# Patient Record
Sex: Female | Born: 1950
Health system: Southern US, Community
[De-identification: ages and names within clinical notes are randomized; demographics above are authoritative.]

## PROBLEM LIST (undated history)

## (undated) DIAGNOSIS — M1990 Unspecified osteoarthritis, unspecified site: Secondary | ICD-10-CM

## (undated) DIAGNOSIS — I10 Essential (primary) hypertension: Secondary | ICD-10-CM

## (undated) HISTORY — PX: JOINT REPLACEMENT: SHX530

## (undated) HISTORY — PX: TONSILLECTOMY: SUR1361

---

## 1997-08-26 HISTORY — PX: REFRACTIVE SURGERY: SHX103

## 2018-09-02 DIAGNOSIS — Z6841 Body Mass Index (BMI) 40.0 and over, adult: Secondary | ICD-10-CM | POA: Insufficient documentation

## 2018-09-22 ENCOUNTER — Encounter: Payer: Self-pay | Admitting: Podiatry

## 2018-09-22 ENCOUNTER — Ambulatory Visit: Payer: Medicare Other | Admitting: Podiatry

## 2018-09-22 VITALS — BP 183/81 | HR 64

## 2018-09-22 DIAGNOSIS — M79676 Pain in unspecified toe(s): Secondary | ICD-10-CM

## 2018-09-22 DIAGNOSIS — B351 Tinea unguium: Secondary | ICD-10-CM

## 2018-09-23 ENCOUNTER — Inpatient Hospital Stay: Admission: RE | Admit: 2018-09-23 | Payer: Self-pay | Source: Ambulatory Visit

## 2018-09-23 NOTE — H&P (Signed)
Olivia Rodriguez is a 68 y.o. female here for PMB .pt G1 P1 c/s and is a new pt for me . She has a 2 month history  of PMB  No pelvic pain . She has not had gyn care in 40 yrs . No pap . No Fhx of gyn cancer .   No Diabetes , but did have GDM in pregnancy   Past Medical History:  has a past medical history of Hypertension.  Past Surgical History:  has a past surgical history that includes Tonsillectomy and Cesarean section. Family History: family history includes Heart disease in her father and mother; High blood pressure (Hypertension) in her father and mother; Myocardial Infarction (Heart attack) in her father and mother. Social History:  reports that she has never smoked. She has never used smokeless tobacco. She reports current alcohol use. She reports that she does not use drugs. OB/GYN History:          OB History    Gravida  1   Para  1   Term      Preterm      AB      Living  1     SAB      TAB      Ectopic      Molar      Multiple      Live Births  1          Allergies: is allergic to penicillin. Medications:  Current Outpatient Medications:  .  fexofenadine (ALLEGRA) 60 MG tablet, Take 60 mg by mouth once daily as needed, Disp: , Rfl:  .  ibuprofen (ADVIL,MOTRIN) 200 MG tablet, Take 200 mg by mouth every 6 (six) hours as needed for Pain., Disp: , Rfl:  .  mirabegron (MYRBETRIQ) 25 mg ER Tablet, Take 1 tablet (25 mg total) by mouth once daily, Disp: 30 tablet, Rfl: 11  Review of Systems: General:                      No fatigue or weight loss Eyes:                           No vision changes Ears:                            No hearing difficulty Respiratory:                No cough or shortness of breath Pulmonary:                  No asthma or shortness of breath Cardiovascular:           No chest pain, palpitations, dyspnea on exertion Gastrointestinal:          No abdominal bloating, chronic diarrhea, constipations, masses, pain or  hematochezia Genitourinary:             No hematuria, dysuria, abnormal vaginal discharge, pelvic pain, Menometrorrhagia Lymphatic:                   No swollen lymph nodes Musculoskeletal:         No muscle weakness Neurologic:                  No extremity weakness, syncope, seizure disorder Psychiatric:  No history of depression, delusions or suicidal/homicidal ideation    Exam:      Vitals:   09/22/18 1001  BP: 143/81  Pulse: 69    Body mass index is 45.54 kg/m.  WDWN white/ black female in NAD   Lungs: CTA  CV : RRR without murmur    Neck:  no thyromegaly Abdomen: soft , no mass, normal active bowel sounds,  non-tender, no rebound tenderness Pelvic: tanner stage 5 ,  External genitalia: vulva /labia no lesions Urethra: no prolapse Vagina: normal physiologic d/c Cervix: no lesions, no cervical motion tenderness , inability to fully visualize  Uterus: normal size shape and contour, non-tender Adnexa: no mass,  non-tender   Rectovaginal:  U/s :Lt lat fibroid near cx=23 mm  Endometrium thickened=35.32 mm  Neither ov seen  No adnexal masses seen     Impression:   The primary encounter diagnosis was PMB (postmenopausal bleeding). A diagnosis of Screening for cervical cancer was also pertinent to this visit. Inability to sample endometrial lining due to patient's body habitus and limitation with left LE   Plan:   Same day Fractional D+C , Hysterography

## 2018-09-29 ENCOUNTER — Other Ambulatory Visit: Payer: Self-pay

## 2018-09-29 ENCOUNTER — Encounter
Admission: RE | Admit: 2018-09-29 | Discharge: 2018-09-29 | Disposition: A | Discharge status: Home / Self Care | Payer: Medicare Other | Source: Ambulatory Visit | Attending: Orthopedic Surgery | Admitting: Orthopedic Surgery

## 2018-09-29 DIAGNOSIS — Z01818 Encounter for other preprocedural examination: Secondary | ICD-10-CM | POA: Diagnosis not present

## 2018-09-29 HISTORY — DX: Unspecified osteoarthritis, unspecified site: M19.90

## 2018-09-29 LAB — URINALYSIS, ROUTINE W REFLEX MICROSCOPIC
Bilirubin Urine: NEGATIVE
Glucose, UA: NEGATIVE mg/dL
Ketones, ur: NEGATIVE mg/dL
Nitrite: NEGATIVE
Protein, ur: 100 mg/dL — AB
RBC / HPF: >50 RBC/hpf — ABNORMAL HIGH (ref 0–5)
Specific Gravity, Urine: 1.018 (ref 1.005–1.030)
WBC, UA: >50 WBC/hpf — ABNORMAL HIGH (ref 0–5)
pH: 6 (ref 5.0–8.0)

## 2018-09-29 LAB — SURGICAL PCR SCREEN
MRSA, PCR: NEGATIVE
Staphylococcus aureus: POSITIVE — AB

## 2018-09-29 LAB — TYPE AND SCREEN
ABO/RH(D): O POS
Antibody Screen: NEGATIVE

## 2018-09-29 LAB — BASIC METABOLIC PANEL
Anion gap: 8 (ref 5–15)
BUN: 31 mg/dL — ABNORMAL HIGH (ref 8–23)
CALCIUM: 9.3 mg/dL (ref 8.9–10.3)
CO2: 25 mmol/L (ref 22–32)
Chloride: 103 mmol/L (ref 98–111)
Creatinine, Ser: 0.67 mg/dL (ref 0.44–1.00)
GFR calc Af Amer: >60 mL/min (ref >60)
GFR calc non Af Amer: >60 mL/min (ref >60)
Glucose, Bld: 96 mg/dL (ref 70–99)
Potassium: 3.9 mmol/L (ref 3.5–5.1)
Sodium: 136 mmol/L (ref 135–145)

## 2018-09-29 LAB — APTT: aPTT: 33 seconds (ref 24–36)

## 2018-09-29 LAB — CBC
HCT: 41.7 % (ref 36.0–46.0)
Hemoglobin: 13.6 g/dL (ref 12.0–15.0)
MCH: 30.9 pg (ref 26.0–34.0)
MCHC: 32.6 g/dL (ref 30.0–36.0)
MCV: 94.8 fL (ref 80.0–100.0)
NRBC: 0 % (ref 0.0–0.2)
PLATELETS: 325 10*3/uL (ref 150–400)
RBC: 4.4 MIL/uL (ref 3.87–5.11)
RDW: 12.2 % (ref 11.5–15.5)
WBC: 8 10*3/uL (ref 4.0–10.5)

## 2018-09-29 LAB — PROTIME-INR
INR: 0.99
Prothrombin Time: 13 seconds (ref 11.4–15.2)

## 2018-09-29 LAB — SEDIMENTATION RATE: Sed Rate: 15 mm/hr (ref 0–30)

## 2018-09-29 NOTE — Progress Notes (Signed)
   SUBJECTIVE Patient presents to office today complaining of elongated, thickened nails that cause pain while ambulating in shoes. She is unable to trim her own nails. Patient is here for further evaluation and treatment.  History reviewed. No pertinent past medical history.  OBJECTIVE General Patient is awake, alert, and oriented x 3 and in no acute distress. Derm Skin is dry and supple bilateral. Negative open lesions or macerations. Remaining integument unremarkable. Nails are tender, long, thickened and dystrophic with subungual debris, consistent with onychomycosis, 1-5 bilateral. No signs of infection noted. Vasc  DP and PT pedal pulses palpable bilaterally. Temperature gradient within normal limits.  Neuro Epicritic and protective threshold sensation grossly intact bilaterally.  Musculoskeletal Exam No symptomatic pedal deformities noted bilateral. Muscular strength within normal limits.  ASSESSMENT 1. Onychodystrophic nails 1-5 bilateral with hyperkeratosis of nails.  2. Onychomycosis of nail due to dermatophyte bilateral 3. Pain in foot bilateral  PLAN OF CARE 1. Patient evaluated today.  2. Instructed to maintain good pedal hygiene and foot care.  3. Mechanical debridement of nails 1-5 bilaterally performed using a nail nipper. Filed with dremel without incident.  4. Return to clinic in 3 mos.    Yenifer Saccente M. Jahnay Lantier, DPM [facility]  Dr. Treylin Burtch M. Khadir Roam, DPM    [address]                                        , [place] [address]                Office [phone]  Fax [phone]      

## 2018-09-29 NOTE — Patient Instructions (Addendum)
Your procedure is scheduled on: Friday 10/02/18.   Report to DAY SURGERY DEPARTMENT LOCATED ON 2ND FLOOR MEDICAL MALL ENTRANCE. To find out your arrival time please call [phone] between 1PM - 3PM on Thursday 10/01/18.   Remember: Instructions that are not followed completely may result in serious medical risk, up to and including death, or upon the discretion of your surgeon and anesthesiologist your surgery may need to be rescheduled.      _X__ 1. Do not eat food after midnight the night before your procedure.                 No gum chewing or hard candies. You may drink clear liquids up to 2 hours                 before you are scheduled to arrive for your surgery- DO NOT drink clear                 liquids within 2 hours of the start of your surgery.                 Clear Liquids include:  water, apple juice without pulp, clear carbohydrate                 drink such as Clearfast or Gatorade, Black Coffee or Tea (Do not add                 milk or creamer to coffee or tea).   __X__2.  On the morning of surgery brush your teeth with toothpaste and water, you may rinse your mouth with mouthwash if you wish.  Do not swallow any toothpaste or mouthwash.      _X__ 3.  No Alcohol for 24 hours before or after surgery.   __X__4.  Notify your doctor if there is any change in your medical condition      (cold, fever, infections).      Do not wear jewelry, make-up, hairpins, clips or nail polish. Do not wear lotions, powders, or perfumes.  Do not shave 48 hours prior to surgery. Men may shave face and neck. Do not bring valuables to the hospital.     [facility] is not responsible for any belongings or valuables.   Contacts, dentures/partials or body piercings may not be worn into surgery. Bring a case for your contacts, glasses or hearing aids, a denture cup will be supplied.    Patients will not be allowed to drive themselves home after surgery. Please plan on having a  competent adult with you.      Please read over the following fact sheets that you were given:   MRSA Information    __X__ Take these medicines the morning of surgery with A SIP OF WATER:     1. NONE      __X__ Stop Anti-inflammatories 7 days before surgery such as Advil, Ibuprofen, Motrin, BC or Goodies Powder, Naprosyn, Naproxen, Aleve, Aspirin, Meloxicam. May take Tylenol if needed for pain or discomfort.    __X__ Do not begin taking any new herbal supplements or over the counter medications until after your procedure.

## 2018-09-30 LAB — URINE CULTURE

## 2018-09-30 NOTE — Pre-Procedure Instructions (Signed)
CLEARANCE REQUEST CALLED AND FAXED WITH EKG TO DR [facility] AND DR Lambert NOTIFIED CASEY AT DR [facility]

## 2018-10-01 NOTE — Pre-Procedure Instructions (Signed)
FAXED UC TO DR [facility]

## 2018-10-01 NOTE — Pre-Procedure Instructions (Signed)
CNL PER DR SCHERMERHORN. CASEY AT DR [facility] NOTIFIED

## 2018-10-02 ENCOUNTER — Encounter: Admission: RE | Payer: Self-pay | Source: Home / Self Care

## 2018-10-02 ENCOUNTER — Ambulatory Visit
Admission: RE | Admit: 2018-10-02 | Payer: Medicare Other | Source: Home / Self Care | Admitting: Obstetrics and Gynecology

## 2018-10-02 SURGERY — DILATATION AND CURETTAGE /HYSTEROSCOPY
Anesthesia: Choice

## 2018-10-02 NOTE — Pre-Procedure Instructions (Signed)
Received fax from Dr. Candiss Norse stating that she has never seen this patient before and patient cancelled her new patient appt for earlier this month. Dr. Rudene Christians office aware that there is a need for clearance d/t her irregular EKG. Patient does not seem to realize that that surgery will also be cancelled as the scheduled procedure for today with Dr. Ouida Sills. Per Sherlie Ban, she has left a message with the patient to call back to [facility] prior to any further cancellations.

## 2018-10-05 DIAGNOSIS — I10 Essential (primary) hypertension: Secondary | ICD-10-CM | POA: Insufficient documentation

## 2018-10-05 MED ORDER — CLINDAMYCIN PHOSPHATE 900 MG/50ML IV SOLN
900.0000 mg | Freq: Once | INTRAVENOUS | Status: AC
  Administered 2018-10-06: 900 mg via INTRAVENOUS

## 2018-10-05 MED ORDER — TRANEXAMIC ACID-NACL 1000-0.7 MG/100ML-% IV SOLN
1000.0000 mg | INTRAVENOUS | Status: AC
  Administered 2018-10-06: 1000 mg via INTRAVENOUS
  Filled 2018-10-05: qty 100

## 2018-10-05 NOTE — Pre-Procedure Instructions (Signed)
CLEARED BY DR PARASCHOS LOW RISK 10/05/18 ON CHART

## 2018-10-06 ENCOUNTER — Inpatient Hospital Stay: Payer: Medicare Other

## 2018-10-06 ENCOUNTER — Encounter
Admission: RE | Disposition: A | Discharge status: Skilled Nursing Facility | Payer: Self-pay | Source: Home / Self Care | Attending: Orthopedic Surgery

## 2018-10-06 ENCOUNTER — Inpatient Hospital Stay: Payer: Medicare Other | Admitting: Anesthesiology

## 2018-10-06 ENCOUNTER — Other Ambulatory Visit: Payer: Self-pay

## 2018-10-06 ENCOUNTER — Inpatient Hospital Stay
Admission: RE | Admit: 2018-10-06 | Discharge: 2018-10-09 | DRG: 470 | Disposition: A | Discharge status: Skilled Nursing Facility | Payer: Medicare Other | Attending: Orthopedic Surgery | Admitting: Orthopedic Surgery

## 2018-10-06 DIAGNOSIS — M1612 Unilateral primary osteoarthritis, left hip: Secondary | ICD-10-CM | POA: Diagnosis present

## 2018-10-06 DIAGNOSIS — Z419 Encounter for procedure for purposes other than remedying health state, unspecified: Secondary | ICD-10-CM

## 2018-10-06 DIAGNOSIS — Z880 Allergy status to penicillin: Secondary | ICD-10-CM | POA: Diagnosis not present

## 2018-10-06 DIAGNOSIS — G8918 Other acute postprocedural pain: Secondary | ICD-10-CM

## 2018-10-06 DIAGNOSIS — Z8249 Family history of ischemic heart disease and other diseases of the circulatory system: Secondary | ICD-10-CM | POA: Diagnosis not present

## 2018-10-06 DIAGNOSIS — Z96642 Presence of left artificial hip joint: Secondary | ICD-10-CM

## 2018-10-06 DIAGNOSIS — Z6841 Body Mass Index (BMI) 40.0 and over, adult: Secondary | ICD-10-CM

## 2018-10-06 HISTORY — PX: TOTAL HIP ARTHROPLASTY: SHX124

## 2018-10-06 LAB — ABO/RH: ABO/RH(D): O POS

## 2018-10-06 LAB — CREATININE, SERUM
CREATININE: 0.68 mg/dL (ref 0.44–1.00)
GFR calc Af Amer: >60 mL/min (ref >60)
GFR calc non Af Amer: >60 mL/min (ref >60)

## 2018-10-06 LAB — CBC
HCT: 39.8 % (ref 36.0–46.0)
Hemoglobin: 13.1 g/dL (ref 12.0–15.0)
MCH: 31.6 pg (ref 26.0–34.0)
MCHC: 32.9 g/dL (ref 30.0–36.0)
MCV: 95.9 fL (ref 80.0–100.0)
Platelets: 242 10*3/uL (ref 150–400)
RBC: 4.15 MIL/uL (ref 3.87–5.11)
RDW: 12.1 % (ref 11.5–15.5)
WBC: 8.3 10*3/uL (ref 4.0–10.5)
nRBC: 0 % (ref 0.0–0.2)

## 2018-10-06 SURGERY — ARTHROPLASTY, HIP, TOTAL, ANTERIOR APPROACH
Anesthesia: Spinal | Site: Hip | Laterality: Left

## 2018-10-06 MED ORDER — DEXMEDETOMIDINE HCL 200 MCG/2ML IV SOLN
INTRAVENOUS | Status: DC | PRN
  Administered 2018-10-06 (×5): 4 ug via INTRAVENOUS

## 2018-10-06 MED ORDER — TRAMADOL HCL 50 MG PO TABS
50.0000 mg | ORAL_TABLET | Freq: Four times a day (QID) | ORAL | Status: DC
  Administered 2018-10-06 – 2018-10-09 (×11): 50 mg via ORAL
  Filled 2018-10-06 (×13): qty 1

## 2018-10-06 MED ORDER — HYDROCODONE-ACETAMINOPHEN 5-325 MG PO TABS: 1.0000 | ORAL_TABLET | ORAL | Status: DC | PRN

## 2018-10-06 MED ORDER — ACETAMINOPHEN 325 MG PO TABS: 325.0000 mg | ORAL_TABLET | Freq: Four times a day (QID) | ORAL | Status: DC | PRN

## 2018-10-06 MED ORDER — METHOCARBAMOL 1000 MG/10ML IJ SOLN
500.0000 mg | Freq: Four times a day (QID) | INTRAVENOUS | Status: DC | PRN
  Filled 2018-10-06: qty 5

## 2018-10-06 MED ORDER — EPINEPHRINE PF 1 MG/ML IJ SOLN
INTRAMUSCULAR | Status: AC
  Filled 2018-10-06: qty 1

## 2018-10-06 MED ORDER — FENTANYL CITRATE (PF) 100 MCG/2ML IJ SOLN
INTRAMUSCULAR | Status: AC
  Filled 2018-10-06: qty 2

## 2018-10-06 MED ORDER — ALUM & MAG HYDROXIDE-SIMETH 200-200-20 MG/5ML PO SUSP: 30.0000 mL | ORAL | Status: DC | PRN

## 2018-10-06 MED ORDER — LACTATED RINGERS IV SOLN
INTRAVENOUS | Status: DC
  Administered 2018-10-06: 11:00:00 via INTRAVENOUS

## 2018-10-06 MED ORDER — ACETAMINOPHEN 500 MG PO TABS
500.0000 mg | ORAL_TABLET | Freq: Four times a day (QID) | ORAL | Status: AC
  Administered 2018-10-06 – 2018-10-07 (×3): 500 mg via ORAL
  Filled 2018-10-06 (×3): qty 1

## 2018-10-06 MED ORDER — ACETAMINOPHEN 10 MG/ML IV SOLN
INTRAVENOUS | Status: AC
  Filled 2018-10-06: qty 100

## 2018-10-06 MED ORDER — BUPIVACAINE-EPINEPHRINE 0.25% -1:200000 IJ SOLN
INTRAMUSCULAR | Status: DC | PRN
  Administered 2018-10-06: 30 mL

## 2018-10-06 MED ORDER — BISACODYL 5 MG PO TBEC
5.0000 mg | DELAYED_RELEASE_TABLET | Freq: Every day | ORAL | Status: DC | PRN
  Administered 2018-10-08: 5 mg via ORAL
  Filled 2018-10-06: qty 1

## 2018-10-06 MED ORDER — MORPHINE SULFATE (PF) 2 MG/ML IV SOLN
0.5000 mg | INTRAVENOUS | Status: DC | PRN
  Administered 2018-10-06: 1 mg via INTRAVENOUS
  Filled 2018-10-06: qty 1

## 2018-10-06 MED ORDER — GENTAMICIN SULFATE 40 MG/ML IJ SOLN
INTRAMUSCULAR | Status: AC
  Filled 2018-10-06: qty 2

## 2018-10-06 MED ORDER — FENTANYL CITRATE (PF) 100 MCG/2ML IJ SOLN: 25.0000 ug | INTRAMUSCULAR | Status: DC | PRN

## 2018-10-06 MED ORDER — BUPIVACAINE HCL (PF) 0.5 % IJ SOLN
INTRAMUSCULAR | Status: AC
  Filled 2018-10-06: qty 10

## 2018-10-06 MED ORDER — ONDANSETRON HCL 4 MG/2ML IJ SOLN
INTRAMUSCULAR | Status: AC
  Filled 2018-10-06: qty 2

## 2018-10-06 MED ORDER — HEPARIN SODIUM (PORCINE) 10000 UNIT/ML IJ SOLN
INTRAMUSCULAR | Status: AC
  Filled 2018-10-06: qty 1

## 2018-10-06 MED ORDER — METOCLOPRAMIDE HCL 10 MG PO TABS: 5.0000 mg | ORAL_TABLET | Freq: Three times a day (TID) | ORAL | Status: DC | PRN

## 2018-10-06 MED ORDER — FAMOTIDINE 20 MG PO TABS
ORAL_TABLET | ORAL | Status: AC
  Administered 2018-10-06: 20 mg via ORAL
  Filled 2018-10-06: qty 1

## 2018-10-06 MED ORDER — PROPOFOL 500 MG/50ML IV EMUL
INTRAVENOUS | Status: DC | PRN
  Administered 2018-10-06: 75 ug/kg/min via INTRAVENOUS

## 2018-10-06 MED ORDER — FAMOTIDINE 20 MG PO TABS
20.0000 mg | ORAL_TABLET | Freq: Once | ORAL | Status: AC
  Administered 2018-10-06: 20 mg via ORAL

## 2018-10-06 MED ORDER — DOCUSATE SODIUM 100 MG PO CAPS
100.0000 mg | ORAL_CAPSULE | Freq: Two times a day (BID) | ORAL | Status: DC
  Administered 2018-10-06 – 2018-10-09 (×6): 100 mg via ORAL
  Filled 2018-10-06 (×6): qty 1

## 2018-10-06 MED ORDER — KETOROLAC TROMETHAMINE 15 MG/ML IJ SOLN
15.0000 mg | Freq: Four times a day (QID) | INTRAMUSCULAR | Status: AC
  Administered 2018-10-06 – 2018-10-07 (×4): 15 mg via INTRAVENOUS
  Filled 2018-10-06 (×4): qty 1

## 2018-10-06 MED ORDER — LIDOCAINE HCL (PF) 2 % IJ SOLN
INTRAMUSCULAR | Status: DC | PRN
  Administered 2018-10-06: 100 mg

## 2018-10-06 MED ORDER — CLONIDINE HCL 0.1 MG PO TABS
0.2000 mg | ORAL_TABLET | Freq: Once | ORAL | Status: AC
  Administered 2018-10-06: 0.2 mg via ORAL
  Filled 2018-10-06: qty 2

## 2018-10-06 MED ORDER — CLINDAMYCIN PHOSPHATE 900 MG/50ML IV SOLN
900.0000 mg | Freq: Four times a day (QID) | INTRAVENOUS | Status: AC
  Administered 2018-10-06 – 2018-10-07 (×3): 900 mg via INTRAVENOUS
  Filled 2018-10-06 (×3): qty 50

## 2018-10-06 MED ORDER — HYDROCODONE-ACETAMINOPHEN 7.5-325 MG PO TABS
1.0000 | ORAL_TABLET | ORAL | Status: DC | PRN
  Administered 2018-10-06: 1 via ORAL
  Filled 2018-10-06: qty 1

## 2018-10-06 MED ORDER — OXYCODONE HCL 5 MG PO TABS
10.0000 mg | ORAL_TABLET | ORAL | Status: DC | PRN
  Administered 2018-10-07: 10 mg via ORAL
  Filled 2018-10-06 (×2): qty 2

## 2018-10-06 MED ORDER — SODIUM CHLORIDE 0.9 % IV SOLN
INTRAVENOUS | Status: DC | PRN
  Administered 2018-10-06: 1000 mL

## 2018-10-06 MED ORDER — LIDOCAINE HCL (PF) 2 % IJ SOLN
INTRAMUSCULAR | Status: AC
  Filled 2018-10-06: qty 10

## 2018-10-06 MED ORDER — BUPIVACAINE HCL (PF) 0.25 % IJ SOLN
INTRAMUSCULAR | Status: AC
  Filled 2018-10-06: qty 30

## 2018-10-06 MED ORDER — ENOXAPARIN SODIUM 40 MG/0.4ML ~~LOC~~ SOLN
40.0000 mg | Freq: Two times a day (BID) | SUBCUTANEOUS | Status: DC
  Administered 2018-10-07 – 2018-10-09 (×5): 40 mg via SUBCUTANEOUS
  Filled 2018-10-06 (×5): qty 0.4

## 2018-10-06 MED ORDER — METOCLOPRAMIDE HCL 5 MG/ML IJ SOLN: 5.0000 mg | Freq: Three times a day (TID) | INTRAMUSCULAR | Status: DC | PRN

## 2018-10-06 MED ORDER — PHENYLEPHRINE HCL 10 MG/ML IJ SOLN
INTRAMUSCULAR | Status: DC | PRN
  Administered 2018-10-06 (×3): 100 ug via INTRAVENOUS

## 2018-10-06 MED ORDER — ZOLPIDEM TARTRATE 5 MG PO TABS: 5.0000 mg | ORAL_TABLET | Freq: Every evening | ORAL | Status: DC | PRN

## 2018-10-06 MED ORDER — PHENOL 1.4 % MT LIQD
1.0000 | OROMUCOSAL | Status: DC | PRN
  Filled 2018-10-06: qty 177

## 2018-10-06 MED ORDER — FENTANYL CITRATE (PF) 100 MCG/2ML IJ SOLN
INTRAMUSCULAR | Status: DC | PRN
  Administered 2018-10-06: 50 ug via INTRAVENOUS
  Administered 2018-10-06: 25 ug via INTRAVENOUS

## 2018-10-06 MED ORDER — MENTHOL 3 MG MT LOZG
1.0000 | LOZENGE | OROMUCOSAL | Status: DC | PRN
  Administered 2018-10-07: 3 mg via ORAL
  Filled 2018-10-06 (×2): qty 9

## 2018-10-06 MED ORDER — DIPHENHYDRAMINE HCL 12.5 MG/5ML PO ELIX: 12.5000 mg | ORAL_SOLUTION | ORAL | Status: DC | PRN

## 2018-10-06 MED ORDER — MAGNESIUM HYDROXIDE 400 MG/5ML PO SUSP
30.0000 mL | Freq: Every day | ORAL | Status: DC | PRN
  Administered 2018-10-09: 30 mL via ORAL
  Filled 2018-10-06: qty 30

## 2018-10-06 MED ORDER — SODIUM CHLORIDE 0.9 % IV SOLN
INTRAVENOUS | Status: DC
  Administered 2018-10-06 – 2018-10-07 (×3): via INTRAVENOUS

## 2018-10-06 MED ORDER — ONDANSETRON HCL 4 MG/2ML IJ SOLN
4.0000 mg | Freq: Four times a day (QID) | INTRAMUSCULAR | Status: DC | PRN
  Administered 2018-10-07: 4 mg via INTRAVENOUS
  Filled 2018-10-06: qty 2

## 2018-10-06 MED ORDER — GLYCOPYRROLATE 0.2 MG/ML IJ SOLN
INTRAMUSCULAR | Status: DC | PRN
  Administered 2018-10-06: 0.2 mg via INTRAVENOUS

## 2018-10-06 MED ORDER — MAGNESIUM CITRATE PO SOLN
1.0000 | Freq: Once | ORAL | Status: DC | PRN
  Filled 2018-10-06: qty 296

## 2018-10-06 MED ORDER — BUPIVACAINE HCL (PF) 0.5 % IJ SOLN
INTRAMUSCULAR | Status: DC | PRN
  Administered 2018-10-06: 3 mL via INTRATHECAL

## 2018-10-06 MED ORDER — CLONIDINE HCL 0.1 MG PO TABS
0.2000 mg | ORAL_TABLET | Freq: Two times a day (BID) | ORAL | Status: DC | PRN
  Filled 2018-10-06: qty 2

## 2018-10-06 MED ORDER — MIDAZOLAM HCL 5 MG/5ML IJ SOLN
INTRAMUSCULAR | Status: DC | PRN
  Administered 2018-10-06: 2 mg via INTRAVENOUS

## 2018-10-06 MED ORDER — CLINDAMYCIN PHOSPHATE 900 MG/50ML IV SOLN
INTRAVENOUS | Status: AC
  Filled 2018-10-06: qty 50

## 2018-10-06 MED ORDER — ONDANSETRON HCL 4 MG/2ML IJ SOLN: 4.0000 mg | Freq: Once | INTRAMUSCULAR | Status: DC | PRN

## 2018-10-06 MED ORDER — GLYCOPYRROLATE 0.2 MG/ML IJ SOLN
INTRAMUSCULAR | Status: AC
  Filled 2018-10-06: qty 1

## 2018-10-06 MED ORDER — METHOCARBAMOL 500 MG PO TABS
500.0000 mg | ORAL_TABLET | Freq: Four times a day (QID) | ORAL | Status: DC | PRN
  Administered 2018-10-06: 500 mg via ORAL
  Filled 2018-10-06: qty 1

## 2018-10-06 MED ORDER — EPHEDRINE SULFATE 50 MG/ML IJ SOLN
INTRAMUSCULAR | Status: DC | PRN
  Administered 2018-10-06: 10 mg via INTRAVENOUS

## 2018-10-06 MED ORDER — MIDAZOLAM HCL 2 MG/2ML IJ SOLN
INTRAMUSCULAR | Status: AC
  Filled 2018-10-06: qty 2

## 2018-10-06 MED ORDER — PROPOFOL 10 MG/ML IV BOLUS
INTRAVENOUS | Status: AC
  Filled 2018-10-06: qty 20

## 2018-10-06 MED ORDER — SODIUM CHLORIDE 0.9 % IV SOLN: INTRAVENOUS | Status: DC | PRN

## 2018-10-06 MED ORDER — ONDANSETRON HCL 4 MG PO TABS: 4.0000 mg | ORAL_TABLET | Freq: Four times a day (QID) | ORAL | Status: DC | PRN

## 2018-10-06 MED ORDER — MIRABEGRON ER 25 MG PO TB24
25.0000 mg | ORAL_TABLET | Freq: Every day | ORAL | Status: DC
  Administered 2018-10-07 – 2018-10-09 (×3): 25 mg via ORAL
  Filled 2018-10-06 (×4): qty 1

## 2018-10-06 SURGICAL SUPPLY — 65 items
BLADE SAGITTAL AGGR TOOTH XLG (BLADE) ×3 IMPLANT
BNDG COHESIVE 6X5 TAN STRL LF (GAUZE/BANDAGES/DRESSINGS) ×9 IMPLANT
CANISTER SUCT 1200ML W/VALVE (MISCELLANEOUS) ×3 IMPLANT
CELL SAVER AUTOCOAGULATE (MISCELLANEOUS) ×3
CELL SAVER COLL SVCS (MISCELLANEOUS) ×3
CELL SAVER FILTER LIPID PALL S (MISCELLANEOUS) ×3
CHLORAPREP W/TINT 26ML (MISCELLANEOUS) ×3 IMPLANT
COVER WAND RF STERILE (DRAPES) ×3 IMPLANT
DRAPE C-ARM XRAY 36X54 (DRAPES) ×3 IMPLANT
DRAPE INCISE IOBAN 66X60 STRL (DRAPES) IMPLANT
DRAPE POUCH INSTRU U-SHP 10X18 (DRAPES) ×3 IMPLANT
DRAPE SHEET LG 3/4 BI-LAMINATE (DRAPES) ×9 IMPLANT
DRAPE TABLE BACK 80X90 (DRAPES) ×3 IMPLANT
DRESSING SURGICEL FIBRLLR 1X2 (HEMOSTASIS) ×2 IMPLANT
DRSG OPSITE POSTOP 4X8 (GAUZE/BANDAGES/DRESSINGS) ×6 IMPLANT
DRSG SURGICEL FIBRILLAR 1X2 (HEMOSTASIS) ×6
ELECT BLADE 6.5 EXT (BLADE) ×3 IMPLANT
ELECT REM PT RETURN 9FT ADLT (ELECTROSURGICAL) ×3
ELECTRODE REM PT RTRN 9FT ADLT (ELECTROSURGICAL) ×1 IMPLANT
FILTER LIPID PALL S CELL SAVER (MISCELLANEOUS) ×1 IMPLANT
GLOVE BIOGEL PI IND STRL 9 (GLOVE) ×1 IMPLANT
GLOVE BIOGEL PI INDICATOR 9 (GLOVE) ×2
GLOVE SURG SYN 9.0  PF PI (GLOVE) ×4
GLOVE SURG SYN 9.0 PF PI (GLOVE) ×2 IMPLANT
GOWN SRG 2XL LVL 4 RGLN SLV (GOWNS) ×1 IMPLANT
GOWN STRL NON-REIN 2XL LVL4 (GOWNS) ×2
GOWN STRL REUS W/ TWL LRG LVL3 (GOWN DISPOSABLE) ×1 IMPLANT
GOWN STRL REUS W/TWL LRG LVL3 (GOWN DISPOSABLE) ×2
HEMOVAC 400CC 10FR (MISCELLANEOUS) IMPLANT
HIP FEM HD S 28 (Head) ×3 IMPLANT
HOLDER FOLEY CATH W/STRAP (MISCELLANEOUS) ×3 IMPLANT
HOOD PEEL AWAY FLYTE STAYCOOL (MISCELLANEOUS) ×3 IMPLANT
KIT PREVENA INCISION MGT 13 (CANNISTER) ×3 IMPLANT
LINER DUAL MOB 50MM (Liner) ×3 IMPLANT
MAT ABSORB  FLUID 56X50 GRAY (MISCELLANEOUS) ×2
MAT ABSORB FLUID 56X50 GRAY (MISCELLANEOUS) ×1 IMPLANT
NDL SAFETY ECLIPSE 18X1.5 (NEEDLE) ×1 IMPLANT
NEEDLE HYPO 18GX1.5 SHARP (NEEDLE) ×2
NEEDLE SPNL 18GX3.5 QUINCKE PK (NEEDLE) ×3 IMPLANT
NS IRRIG 1000ML POUR BTL (IV SOLUTION) ×3 IMPLANT
PACK HIP COMPR (MISCELLANEOUS) ×3 IMPLANT
PENCIL SMOKE ULTRAEVAC 22 CON (MISCELLANEOUS) ×3 IMPLANT
SAVE CELL AUTOCOAG (MISCELLANEOUS) ×1 IMPLANT
SAVER CELL COLL SVCS (MISCELLANEOUS) ×1 IMPLANT
SCALPEL PROTECTED #10 DISP (BLADE) ×6 IMPLANT
SHELL ACETABULAR SZ0 50 DME (Shell) ×3 IMPLANT
SOL PREP PVP 2OZ (MISCELLANEOUS) ×3
SOLUTION PREP PVP 2OZ (MISCELLANEOUS) ×1 IMPLANT
SPONGE DRAIN TRACH 4X4 STRL 2S (GAUZE/BANDAGES/DRESSINGS) ×3 IMPLANT
STAPLER SKIN PROX 35W (STAPLE) ×3 IMPLANT
STEM FEMORAL SZ LAT COLLARED (Stem) ×3 IMPLANT
STRAP SAFETY 5IN WIDE (MISCELLANEOUS) ×3 IMPLANT
SUT DVC 2 QUILL PDO  T11 36X36 (SUTURE) ×2
SUT DVC 2 QUILL PDO T11 36X36 (SUTURE) ×1 IMPLANT
SUT SILK 0 (SUTURE) ×2
SUT SILK 0 30XBRD TIE 6 (SUTURE) ×1 IMPLANT
SUT V-LOC 90 ABS DVC 3-0 CL (SUTURE) ×3 IMPLANT
SUT VIC AB 1 CT1 36 (SUTURE) ×3 IMPLANT
SYR 20CC LL (SYRINGE) ×3 IMPLANT
SYR 30ML LL (SYRINGE) ×3 IMPLANT
SYR BULB IRRIG 60ML STRL (SYRINGE) ×3 IMPLANT
TAPE MICROFOAM 4IN (TAPE) ×3 IMPLANT
TOWEL OR 17X26 4PK STRL BLUE (TOWEL DISPOSABLE) ×3 IMPLANT
TRAY FOLEY MTR SLVR 16FR STAT (SET/KITS/TRAYS/PACK) ×3 IMPLANT
WND VAC CANISTER 500ML (MISCELLANEOUS) ×3 IMPLANT

## 2018-10-06 NOTE — Progress Notes (Signed)
Spoke with Dr Rudene Christians, redness in the leg is normal for her.

## 2018-10-06 NOTE — NC FL2 (Signed)
[place] LEVEL OF CARE SCREENING TOOL     IDENTIFICATION  Patient Name: Olivia Rodriguez Birthdate: December 04, 1950 Sex: female Admission Date (Current Location): 10/06/2018  [place] and [place] Number:  Engineering geologist and Address:  [facility], [address] [place], [place] [address]      Provider Number: [id number]  Attending Physician Name and Address:  Hessie Knows, MD  Relative Name and Phone Number:       Current Level of Care: Hospital Recommended Level of Care: [place] Prior Approval Number:    Date Approved/Denied:   PASRR Number:    Discharge Plan: SNF    Current Diagnoses: Patient Active Problem List   Diagnosis Date Noted  . Status post total hip replacement, left 10/06/2018    Orientation RESPIRATION BLADDER Height & Weight     Self, Time, Situation, Place  Normal Continent Weight: 253 lb 15.5 oz (115.2 kg) Height:  5' 2.5" (158.8 cm)  BEHAVIORAL SYMPTOMS/MOOD NEUROLOGICAL BOWEL NUTRITION STATUS      Continent Diet(Diet: Regular )  AMBULATORY STATUS COMMUNICATION OF NEEDS Skin   Extensive Assist Verbally Surgical wounds, Wound Vac(Incision: Left Hip, Provena Wound Vac. )                       Personal Care Assistance Level of Assistance  Bathing, Feeding, Dressing Bathing Assistance: Limited assistance Feeding assistance: Independent Dressing Assistance: Limited assistance     Functional Limitations Info  Sight, Hearing, Speech Sight Info: Adequate Hearing Info: Adequate Speech Info: Adequate    SPECIAL CARE FACTORS FREQUENCY  PT (By licensed PT), OT (By licensed OT)     PT Frequency: (5) OT Frequency: (5)            Contractures      Additional Factors Info  Code Status, Allergies Code Status Info: (Full Code. ) Allergies Info: (Penicillins)           Current Medications (10/06/2018):  This is the current hospital active medication list Current  Facility-Administered Medications  Medication Dose Route Frequency Provider Last Rate Last Dose  . 0.9 %  sodium chloride infusion   Intravenous Continuous Hessie Knows, MD 100 mL/hr at 10/06/18 1517    . [START ON 10/07/2018] acetaminophen (TYLENOL) tablet 325-650 mg  325-650 mg Oral Q6H PRN Hessie Knows, MD      . acetaminophen (TYLENOL) tablet 500 mg  500 mg Oral Q6H Hessie Knows, MD      . alum & mag hydroxide-simeth (MAALOX/MYLANTA) 200-200-20 MG/5ML suspension 30 mL  30 mL Oral Q4H PRN Hessie Knows, MD      . bisacodyl (DULCOLAX) EC tablet 5 mg  5 mg Oral Daily PRN Hessie Knows, MD      . clindamycin (CLEOCIN) IVPB 900 mg  900 mg Intravenous Q6H Hessie Knows, MD      . diphenhydrAMINE (BENADRYL) 12.5 MG/5ML elixir 12.5-25 mg  12.5-25 mg Oral Q4H PRN Hessie Knows, MD      . docusate sodium (COLACE) capsule 100 mg  100 mg Oral BID Hessie Knows, MD      . Derrill Memo ON 10/07/2018] enoxaparin (LOVENOX) injection 40 mg  40 mg Subcutaneous Q12H Hessie Knows, MD      . HYDROcodone-acetaminophen [facility]) 7.5-325 MG per tablet 1-2 tablet  1-2 tablet Oral Q4H PRN Hessie Knows, MD      . HYDROcodone-acetaminophen (NORCO/VICODIN) 5-325 MG per tablet 1-2 tablet  1-2 tablet Oral Q4H PRN Hessie Knows, MD      .  magnesium citrate solution 1 Bottle  1 Bottle Oral Once PRN Hessie Knows, MD      . magnesium hydroxide (MILK OF MAGNESIA) suspension 30 mL  30 mL Oral Daily PRN Hessie Knows, MD      . menthol-cetylpyridinium (CEPACOL) lozenge 3 mg  1 lozenge Oral PRN Hessie Knows, MD       Or  . phenol (CHLORASEPTIC) mouth spray 1 spray  1 spray Mouth/Throat PRN Hessie Knows, MD      . methocarbamol (ROBAXIN) tablet 500 mg  500 mg Oral Q6H PRN Hessie Knows, MD       Or  . methocarbamol (ROBAXIN) 500 mg in dextrose 5 % 50 mL IVPB  500 mg Intravenous Q6H PRN Hessie Knows, MD      . metoCLOPramide (REGLAN) tablet 5-10 mg  5-10 mg Oral Q8H PRN Hessie Knows, MD       Or  . metoCLOPramide (REGLAN)  injection 5-10 mg  5-10 mg Intravenous Q8H PRN Hessie Knows, MD      . mirabegron ER [facility]) tablet 25 mg  25 mg Oral Daily Hessie Knows, MD      . morphine 2 MG/ML injection 0.5-1 mg  0.5-1 mg Intravenous Q2H PRN Hessie Knows, MD      . ondansetron [facility]) tablet 4 mg  4 mg Oral Q6H PRN Hessie Knows, MD       Or  . ondansetron [facility]) injection 4 mg  4 mg Intravenous Q6H PRN Hessie Knows, MD      . traMADol Veat[facility] Bourbon) tablet 50 mg  50 mg Oral Q6H Hessie Knows, MD      . zolpidem (AMBIEN) tablet 5 mg  5 mg Oral QHS PRN Hessie Knows, MD         Discharge Medications: Please see discharge summary for a list of discharge medications.  Relevant Imaging Results:  Relevant Lab Results:   Additional Information    Rody Keadle, Veronia Beets, LCSW

## 2018-10-06 NOTE — Anesthesia Procedure Notes (Signed)
Spinal  Patient location during procedure: OR Staffing Anesthesiologist: Molli Barrows, MD Resident/CRNA: Rolla Plate, CRNA Other anesthesia staff: Lynnda Shields, RN Performed: resident/CRNA  Preanesthetic Checklist Completed: patient identified, site marked, surgical consent, pre-op evaluation, timeout performed, IV checked, risks and benefits discussed and monitors and equipment checked Spinal Block Patient position: sitting Prep: ChloraPrep and site prepped and draped Patient monitoring: heart rate, continuous pulse ox, blood pressure and cardiac monitor Approach: midline Location: L4-5 Injection technique: single-shot Needle Needle type: Introducer and Pencan  Needle gauge: 24 G Needle length: 9 cm Additional Notes Negative paresthesia. Negative blood return. Positive free-flowing CSF. Expiration date of kit checked and confirmed. Patient tolerated procedure well, without complications.

## 2018-10-06 NOTE — Anesthesia Preprocedure Evaluation (Signed)
Anesthesia Evaluation  Patient identified by MRN, date of birth, ID band Patient awake    Reviewed: Allergy & Precautions, H&P , NPO status , Patient's Chart, lab work & pertinent test results, reviewed documented beta blocker date and time   Airway Mallampati: II   Neck ROM: full    Dental  (+) Poor Dentition   Pulmonary neg pulmonary ROS,    Pulmonary exam normal        Cardiovascular Exercise Tolerance: Good negative cardio ROS Normal cardiovascular exam Rhythm:regular Rate:Normal     Neuro/Psych negative neurological ROS  negative psych ROS   GI/Hepatic negative GI ROS, Neg liver ROS,   Endo/Other  Morbid obesity  Renal/GU negative Renal ROS  negative genitourinary   Musculoskeletal   Abdominal   Peds  Hematology negative hematology ROS (+)   Anesthesia Other Findings Past Medical History: No date: Arthritis Past Surgical History: No date: CESAREAN SECTION 1999: REFRACTIVE SURGERY; Bilateral No date: TONSILLECTOMY   Reproductive/Obstetrics negative OB ROS                             Anesthesia Physical Anesthesia Plan  ASA: III  Anesthesia Plan: General and Spinal   Post-op Pain Management:    Induction:   PONV Risk Score and Plan:   Airway Management Planned:   Additional Equipment:   Intra-op Plan:   Post-operative Plan:   Informed Consent: I have reviewed the patients History and Physical, chart, labs and discussed the procedure including the risks, benefits and alternatives for the proposed anesthesia with the patient or authorized representative who has indicated his/her understanding and acceptance.     Dental Advisory Given  Plan Discussed with: CRNA  Anesthesia Plan Comments:         Anesthesia Quick Evaluation

## 2018-10-06 NOTE — Progress Notes (Signed)
Patient comes to PACU from [place]. Noticed upon assessment that her left leg (operative leg) had some redness and warm to the touch. Cap refill <3 sec, great pedal pulses. Dr Rudene Christians paged.

## 2018-10-06 NOTE — Progress Notes (Signed)
Patient's BP 190/100, no c/o pain. Patient states that she doesn't take anything for HTN at home. Notified MD. New orders for a one time dose of Clonidine 0.2mg  PO x1.

## 2018-10-06 NOTE — Op Note (Signed)
10/06/2018  2:25 PM  PATIENT:  Olivia Rodriguez  68 y.o. female  PRE-OPERATIVE DIAGNOSIS:  PRIMARY OSTEOARTHRITIS LEFT HIP  POST-OPERATIVE DIAGNOSIS:  PRIMARY OSTEOARTHRITIS LEFT HIP  PROCEDURE:  Procedure(s): TOTAL HIP ARTHROPLASTY ANTERIOR APPROACH-LEFT (Left)  SURGEON: Laurene Footman, MD  ASSISTANTS: None  ANESTHESIA:   spinal  EBL:  No intake/output data recorded.  BLOOD ADMINISTERED:none  DRAINS: none   LOCAL MEDICATIONS USED:  MARCAINE     SPECIMEN:  Source of Specimen:  Left femoral head  DISPOSITION OF SPECIMEN:  PATHOLOGY  COUNTS:  YES  TOURNIQUET:  * No tourniquets in log *  IMPLANTS: Medacta AMIS lateralized 1 stem, 50 mm Mpact DM cup and liner with metal S 28 mm head  DICTATION: .Dragon Dictation   The patient was brought to the operating room and after spinal anesthesia was obtained patient was placed on the operative table with the ipsilateral foot into the Medacta attachment, contralateral leg on a well-padded table. C-arm was brought in and preop template x-ray taken. After prepping and draping in usual sterile fashion appropriate patient identification and timeout procedures were completed. Anterior approach to the hip was obtained and centered over the greater trochanter and TFL muscle. The subcutaneous tissue was incised hemostasis being achieved by electrocautery. TFL fascia was incised and the muscle retracted laterally deep retractor placed. The lateral femoral circumflex vessels were identified and ligated. The anterior capsule was exposed and a capsulotomy performed. The neck was identified and a femoral neck cut carried out with a saw. The head was removed without difficulty and showed sclerotic femoral head and acetabulum. Reaming was carried out to 50 mm and a 50 mm cup trial gave appropriate tightness to the acetabular component a 50 DM cup was impacted into position. The leg was then externally rotated and ischiofemoral and pubofemoral releases carried out.  The femur was sequentially broached to a size 1, size 1 lateralized and as had trials were placed and the final components chosen. The one lateralized stem was inserted along with a metal S 28 mm head and 50 mm liner. The hip was reduced and was stable the wound was thoroughly irrigated with fibrillar placed along the posterior capsule and medial neck. The deep fascia ws closed using a heavy Quill after infiltration of 30 cc of quarter percent Sensorcaine with epinephrine.3-0 V-loc to close the skin with skin staples.  Incisional wound VAC was applied patient was sent to recovery in stable condition.   PLAN OF CARE: Admit to inpatient

## 2018-10-06 NOTE — Progress Notes (Signed)
Anticoagulation monitoring(Lovenox):  68 yo female ordered Lovenox 40 mg Q24h  There were no vitals filed for this visit. BMI 45.6    Lab Results  Component Value Date   CREATININE 0.67 09/29/2018   Estimated Creatinine Clearance: 81.6 mL/min (by C-G formula based on SCr of 0.67 mg/dL). Hemoglobin & Hematocrit     Component Value Date/Time   HGB 13.6 09/29/2018 1540   HCT 41.7 09/29/2018 1540     Per Protocol for Patient with estCrcl > 30 ml/min and BMI > 40, will transition to Lovenox 40 mg Q12h.

## 2018-10-06 NOTE — Anesthesia Post-op Follow-up Note (Signed)
Anesthesia QCDR form completed.        

## 2018-10-06 NOTE — H&P (Signed)
Reviewed paper H+P, will be scanned into chart. No changes noted.  

## 2018-10-06 NOTE — Transfer of Care (Signed)
Immediate Anesthesia Transfer of Care Note  Patient: Olivia Rodriguez  Procedure(s) Performed: TOTAL HIP ARTHROPLASTY ANTERIOR APPROACH-LEFT (Left Hip)  Patient Location: PACU  Anesthesia Type:Spinal  Level of Consciousness: awake  Airway & Oxygen Therapy: Patient Spontanous Breathing and Patient connected to face mask oxygen  Post-op Assessment: Report given to RN and Post -op Vital signs reviewed and stable  Post vital signs: Reviewed  Last Vitals:  Vitals Value Taken Time  BP 104/48 10/06/2018  2:25 PM  Temp 36.6 C 10/06/2018  2:25 PM  Pulse 58 10/06/2018  2:26 PM  Resp 17 10/06/2018  2:26 PM  SpO2 99 % 10/06/2018  2:26 PM  Vitals shown include unvalidated device data.  Last Pain:  Vitals:   10/06/18 1048  TempSrc: Temporal  PainSc: 9          Complications: No apparent anesthesia complications

## 2018-10-07 ENCOUNTER — Encounter: Payer: Self-pay | Admitting: Orthopedic Surgery

## 2018-10-07 LAB — CBC
HCT: 34.4 % — ABNORMAL LOW (ref 36.0–46.0)
Hemoglobin: 11.4 g/dL — ABNORMAL LOW (ref 12.0–15.0)
MCH: 31.1 pg (ref 26.0–34.0)
MCHC: 33.1 g/dL (ref 30.0–36.0)
MCV: 94 fL (ref 80.0–100.0)
PLATELETS: 226 10*3/uL (ref 150–400)
RBC: 3.66 MIL/uL — AB (ref 3.87–5.11)
RDW: 11.9 % (ref 11.5–15.5)
WBC: 10 10*3/uL (ref 4.0–10.5)
nRBC: 0 % (ref 0.0–0.2)

## 2018-10-07 LAB — BASIC METABOLIC PANEL
Anion gap: 4 — ABNORMAL LOW (ref 5–15)
BUN: 18 mg/dL (ref 8–23)
CO2: 28 mmol/L (ref 22–32)
Calcium: 8.5 mg/dL — ABNORMAL LOW (ref 8.9–10.3)
Chloride: 99 mmol/L (ref 98–111)
Creatinine, Ser: 0.74 mg/dL (ref 0.44–1.00)
GFR calc Af Amer: >60 mL/min (ref >60)
GFR calc non Af Amer: >60 mL/min (ref >60)
Glucose, Bld: 124 mg/dL — ABNORMAL HIGH (ref 70–99)
Potassium: 4.1 mmol/L (ref 3.5–5.1)
Sodium: 131 mmol/L — ABNORMAL LOW (ref 135–145)

## 2018-10-07 NOTE — Progress Notes (Signed)
   Subjective: 1 Day Post-Op Procedure(s) (LRB): TOTAL HIP ARTHROPLASTY ANTERIOR APPROACH-LEFT (Left) Patient reports pain as 1 on 0-10 scale.   Patient is well, and has had no acute complaints or problems Denies any CP, SOB, ABD pain. We will continue therapy today.  Plan is to go Home after hospital stay.  Objective: Vital signs in last 24 hours: Temp:  [97.5 F (36.4 C)-99.7 F (37.6 C)] 97.7 F (36.5 C) (02/12 0744) Pulse Rate:  [49-72] 57 (02/12 0744) Resp:  [12-22] 18 (02/12 0529) BP: (104-190)/(48-100) 154/75 (02/12 0744) SpO2:  [96 %-100 %] 96 % (02/12 0744) Weight:  [115.2 kg] 115.2 kg (02/11 1600)  Intake/Output from previous day: 02/11 0701 - 02/12 0700 In: 1265 [P.O.:240; I.V.:900; Blood:125] Out: 1450 [Urine:1225; Blood:225] Intake/Output this shift: No intake/output data recorded.  Recent Labs    10/06/18 1625 10/07/18 0502  HGB 13.1 11.4*   Recent Labs    10/06/18 1625 10/07/18 0502  WBC 8.3 10.0  RBC 4.15 3.66*  HCT 39.8 34.4*  PLT 242 226   Recent Labs    10/06/18 1625 10/07/18 0502  NA  --  131*  K  --  4.1  CL  --  99  CO2  --  28  BUN  --  18  CREATININE 0.68 0.74  GLUCOSE  --  124*  CALCIUM  --  8.5*   No results for input(s): LABPT, INR in the last 72 hours.  EXAM General - Patient is Alert, Appropriate and Oriented Extremity - Neurovascular intact Sensation intact distally Intact pulses distally Dorsiflexion/Plantar flexion intact No cellulitis present Compartment soft Dressing - dressing C/D/I and no drainage, prevena intact with out drainage Motor Function - intact, moving foot and toes well on exam.   Past Medical History:  Diagnosis Date  . Arthritis     Assessment/Plan:   1 Day Post-Op Procedure(s) (LRB): TOTAL HIP ARTHROPLASTY ANTERIOR APPROACH-LEFT (Left) Active Problems:   Status post total hip replacement, left  Estimated body mass index is 45.71 kg/m as calculated from the following:   Height as of  this encounter: 5' 2.5" (1.588 m).   Weight as of this encounter: 115.2 kg. Advance diet Up with therapy  Needs BM Recheck labs in the am VSS CM to assist with discharge to home with HHPT  DVT Prophylaxis - Lovenox, TED hose and SCDs Weight-Bearing as tolerated to left leg   T. Rachelle Hora, PA-C [place] 10/07/2018, 8:05 AM

## 2018-10-07 NOTE — Evaluation (Signed)
Physical Therapy Evaluation Patient Details Name: Olivia Rodriguez MRN: [id number] DOB: 1951-02-01 Today's Date: 10/07/2018   History of Present Illness  Pt is a 68 y.o. female s/p L THA anterior approach 10/06/18.  Clinical Impression  Prior to hospital admission, pt was modified independent ambulating with SPC.  Pt lives alone in 1 level home with steps to enter.  Currently pt is min assist semi-supine to sit; min assist to stand up to walker; and min assist to take 1 small step forward with B LE's with use of walker.  Vc's required for [place] during activities.  Limited ambulation d/t pt suddenly nauseas and drowsy requiring assist to sit onto bed safely; pt then assisted back to bed (with 2 assist) for safety d/t symptoms not improving.  BP noted to be 174/76 with HR 51 bpm (nurse notified of vitals and above symptoms).  Symptoms improved with rest in bed.  Pain 1/10 L hip beginning of session and 0/10 end of session resting in bed.  Pt would benefit from skilled PT to address noted impairments and functional limitations (see below for any additional details).  Upon hospital discharge, anticipate pt will be able to discharge home with support of family (pt reports having someone come stay with her for about 2 weeks).    Follow Up Recommendations Home health PT;Supervision/Assistance - 24 hour    Equipment Recommendations  Rolling walker with 5" wheels;3in1 (PT)(youth sized)    Recommendations for Other Services OT consult     Precautions / Restrictions Precautions Precautions: Fall;Anterior Hip Precaution Booklet Issued: Yes (comment) Restrictions Weight Bearing Restrictions: Yes LLE Weight Bearing: Partial weight bearing LLE Partial Weight Bearing Percentage or Pounds: 50%      Mobility  Bed Mobility Overal bed mobility: Needs Assistance Bed Mobility: Supine to Sit;Sit to Supine     Supine to sit: Min assist;HOB elevated Sit to supine: +2 for safety/equipment   General bed  mobility comments: min assist for L LE semi-supine to sit with use of bed rail and increased effort to perform; +2 for safety sit to supine d/t pt nauseas and drowsy and then 2 assist to boost pt up in bed  Transfers Overall transfer level: Needs assistance Equipment used: Rolling walker (2 wheeled) Transfers: Sit to/from Stand Sit to Stand: Min assist         General transfer comment: vc's for UE/LE placement and PWB'ing status; minimal assist to initiate stand up to walker  Ambulation/Gait Ambulation/Gait assistance: Min assist Gait Distance (Feet): (1 step forward B) Assistive device: Rolling walker (2 wheeled)   Gait velocity: decreased   General Gait Details: vc's for PWB'ing status and to increase UE support through RW; limited ambulation d/t pt suddenly nauseas and drowsy  Stairs            Wheelchair Mobility    Modified Rankin (Stroke Patients Only)       Balance Overall balance assessment: Needs assistance Sitting-balance support: No upper extremity supported;Feet supported Sitting balance-Leahy Scale: Good Sitting balance - Comments: steady sitting reaching within BOS   Standing balance support: Single extremity supported Standing balance-Leahy Scale: Poor Standing balance comment: pt requiring at least single UE support for static standing balance                             Pertinent Vitals/Pain Pain Assessment: No/denies pain    Home Living Family/patient expects to be discharged to:: Private residence Living Arrangements: Alone Available Help  at Discharge: Family(Daughter coming to stay with pt for about 2 weeks) Type of Home: House Home Access: Stairs to enter   [employer] of Steps: 2 steps with R railing from back (where she parks the car) and 4-5 steps with L railing from front Home Layout: One level Home Equipment: [place] - single point;Grab bars - tub/shower      Prior Function Level of Independence: Independent  with assistive device(s)         Comments: Ambulatory with SPC.  Pt reports 1 fall around Thanksgiving time outside.     Hand Dominance        Extremity/Trunk Assessment   Upper Extremity Assessment Upper Extremity Assessment: Overall WFL for tasks assessed    Lower Extremity Assessment Lower Extremity Assessment: RLE deficits/detail;LLE deficits/detail RLE Deficits / Details: strength and ROM WFL LLE Deficits / Details: hip flexion at least 3-/5 (limited d/t hip pain); knee flexion/extension at leaast 3/5 (limited d/t hip pain) and DF at least 3/5 AROM LLE: Unable to fully assess due to pain    Cervical / Trunk Assessment Cervical / Trunk Assessment: Normal  Communication   Communication: No difficulties  Cognition Arousal/Alertness: Awake/alert Behavior During Therapy: WFL for tasks assessed/performed Overall Cognitive Status: Within Functional Limits for tasks assessed                                        General Comments General comments (skin integrity, edema, etc.): L hip wound vac in place/intact beginning and end of session.  Pt agreeable to PT session.    Exercises Total Joint Exercises Ankle Circles/Pumps: AROM;Strengthening;Both;10 reps;Supine Quad Sets: AROM;Strengthening;Both;10 reps;Supine Short Arc Quad: AROM;Strengthening;Left;10 reps;Supine Heel Slides: AAROM;Strengthening;Left;10 reps;Supine Hip ABduction/ADduction: AAROM;Strengthening;Left;10 reps;Supine   Assessment/Plan    PT Assessment Patient needs continued PT services  PT Problem List Decreased strength;Decreased activity tolerance;Decreased balance;Decreased mobility;Decreased knowledge of use of DME;Decreased knowledge of precautions;Pain;Decreased skin integrity       PT Treatment Interventions DME instruction;Gait training;Stair training;Functional mobility training;Therapeutic activities;Therapeutic exercise;Balance training;Patient/family education    PT Goals  (Current goals can be found in the Care Plan section)  Acute Rehab PT Goals Patient Stated Goal: to go home PT Goal Formulation: With patient Time For Goal Achievement: 10/21/18 Potential to Achieve Goals: Fair    Frequency BID   Barriers to discharge Decreased caregiver support      Co-evaluation               AM-PAC PT "6 Clicks" Mobility  Outcome Measure Help needed turning from your back to your side while in a flat bed without using bedrails?: A Little Help needed moving from lying on your back to sitting on the side of a flat bed without using bedrails?: A Little Help needed moving to and from a bed to a chair (including a wheelchair)?: A Little Help needed standing up from a chair using your arms (e.g., wheelchair or bedside chair)?: A Little Help needed to walk in hospital room?: A Little Help needed climbing 3-5 steps with a railing? : A Lot 6 Click Score: 17    End of Session Equipment Utilized During Treatment: Gait belt Activity Tolerance: Other (comment)(Limited d/t nausea and drowsiness) Patient left: in bed;with call bell/phone within reach;with bed alarm set;with SCD's reapplied;Other (comment)(L LE elevated via pillow (heel floating)) Nurse Communication: Mobility status;Precautions;Weight bearing status;Other (comment)(Pt's nausea, drowsiness, and elevated BP) PT Visit Diagnosis: Other  abnormalities of gait and mobility (R26.89);Muscle weakness (generalized) (M62.81);History of falling (Z91.81);Difficulty in walking, not elsewhere classified (R26.2);Pain Pain - Right/Left: Left Pain - part of body: Hip    Time: 1484-0397 PT Time Calculation (min) (ACUTE ONLY): 46 min   Charges:   PT Evaluation $PT Eval Low Complexity: 1 Low PT Treatments $Therapeutic Exercise: 8-22 mins $Therapeutic Activity: 8-22 mins       Leitha Bleak, PT 10/07/18, 11:45 AM [phone]

## 2018-10-07 NOTE — Progress Notes (Signed)
Clinical Social Worker (CSW) received SNF consult. PT is recommending home health. RN case manager aware of above. Please reconsult if future social work needs arise. CSW signing off.   Jaiona Simien, LCSW [phone] 

## 2018-10-07 NOTE — Care Management Note (Signed)
Case Management Note  Patient Details  Name: Olivia Rodriguez MRN: [id number] Date of Birth: 1950/09/09  Subjective/Objective:                   Met with the patient to discuss DC plan and needs The patient needs a youth rolling walker and 3 in 1 Notified Brad with [facility] Provided the [facility] list per [email or web address] The patient would like to used [facility], notified Helene Kelp at [place] The patient is getting set up- with PCP  [facility] Her daughter is going to come and stay with her for a couple of weeks, she will provided transportation [facility] called to set up with [employer] Check Lovenox price before DC home Action/Plan: [facility] list provided per CMS.Darl Householder with [facility] was notified of Choice  Expected Discharge Date:  10/09/18               Expected Discharge Plan:     In-House Referral:     Discharge planning Services  CM Consult  Post Acute Care Choice:    Choice offered to:     DME Arranged:    DME Agency:     HH Arranged:  PT [place]:  [facility]  Status of Service:  In process, will continue to follow  If discussed at Long Length of Stay Meetings, dates discussed:    Additional Comments:  Su Hilt, RN 10/07/2018, 3:32 PM

## 2018-10-07 NOTE — Anesthesia Postprocedure Evaluation (Signed)
Anesthesia Post Note  Patient: [name]  Procedure(s) Performed: TOTAL HIP ARTHROPLASTY ANTERIOR APPROACH-LEFT (Left Hip)  Patient location during evaluation: Nursing Unit Anesthesia Type: Spinal Level of consciousness: oriented and awake and alert Pain management: pain level controlled Vital Signs Assessment: post-procedure vital signs reviewed and stable Respiratory status: spontaneous breathing and respiratory function stable Cardiovascular status: blood pressure returned to baseline and stable Postop Assessment: no headache, no backache, no apparent nausea or vomiting and patient able to bend at knees Anesthetic complications: no     Last Vitals:  Vitals:   10/06/18 2350 10/07/18 0529  BP: (!) 148/62 130/63  Pulse: 61 (!) 54  Resp: 19 18  Temp: 36.7 C 36.5 C  SpO2: 96% 98%    Last Pain:  Vitals:   10/07/18 0529  TempSrc: Oral  PainSc:                  Alison Stalling

## 2018-10-07 NOTE — Progress Notes (Signed)
Physical Therapy Treatment Patient Details Name: Olivia Rodriguez MRN: [id number] DOB: 12-25-1950 Today's Date: 10/07/2018    History of Present Illness Pt is a 68 y.o. female s/p L THA anterior approach 10/06/18.    PT Comments    Pt able to progress to CGA with transfers and ambulating up to 5 feet with RW; intermittent vc's required for PWB'ing status and technique with transfers/gait.  Limited distance ambulating d/t concerns of pt maintaining PWB'ing status.  Therapist brought a different RW in for use during OT session (bariatric youth sized) to trial to see if it was a better fit for pt (and if it would improve pt's ability to maintain [place] status).  Will continue to progress pt with strengthening and progressive functional mobility per pt tolerance; also will continue to monitor pt's ability to maintain PWB'ing status.  Will continue to work pt towards home discharge as able.   Follow Up Recommendations  Home health PT;Supervision/Assistance - 24 hour     Equipment Recommendations  Rolling walker with 5" wheels;3in1 (PT)(youth sized bariatric DME)    Recommendations for Other Services OT consult     Precautions / Restrictions Precautions Precautions: Fall;Anterior Hip Precaution Booklet Issued: Yes (comment) Restrictions Weight Bearing Restrictions: Yes LLE Weight Bearing: Partial weight bearing LLE Partial Weight Bearing Percentage or Pounds: 50%    Mobility  Bed Mobility Overal bed mobility: Needs Assistance Bed Mobility: Supine to Sit     Supine to sit: Min assist;HOB elevated   General bed mobility comments: min assist for L LE semi-supine to sit with use of bed rail and increased effort to perform; vc's for technique required  Transfers Overall transfer level: Needs assistance Equipment used: Rolling walker (2 wheeled) Transfers: Sit to/from [facility] Sit to Stand: Min guard Stand pivot transfers: Min guard(bed to [facility])       General  transfer comment: extra time for pt to problem solve how to stand from bed; vc's for UE/LE placement; CGA to stand from bed (using momentum) and CGA to stand from [facility]  Ambulation/Gait Ambulation/Gait assistance: Min guard Gait Distance (Feet): 5 Feet Assistive device: Rolling walker (2 wheeled)   Gait velocity: decreased   General Gait Details: intermittent vc's for PWB'ing status and to increase UE support through [employer]    Modified Rankin (Stroke Patients Only)       Balance Overall balance assessment: Needs assistance Sitting-balance support: No upper extremity supported;Feet supported Sitting balance-Leahy Scale: Good Sitting balance - Comments: steady sitting reaching within BOS   Standing balance support: Single extremity supported Standing balance-Leahy Scale: Poor Standing balance comment: pt requiring at least single UE support for static standing balance                            Cognition Arousal/Alertness: Awake/alert Behavior During Therapy: WFL for tasks assessed/performed Overall Cognitive Status: Within Functional Limits for tasks assessed                                        Exercises     General Comments General comments (skin integrity, edema, etc.): L hip wound vac in place.  Pt agreeable to PT session and requesting to toilet (BSC brought to pt for use).  OT co-session (OT eval and PT  treatment) for safety and ADL's/functional mobility      Pertinent Vitals/Pain Pain Assessment: 0-10 Pain Score: 2  Pain Location: L hip Pain Descriptors / Indicators: Sore Pain Intervention(s): Limited activity within patient's tolerance;Monitored during session;Repositioned    Home Living Family/patient expects to be discharged to:: Private residence Living Arrangements: Alone Available Help at Discharge: Family(Daughter coming to stay with pt for about 2 weeks) Type of Home:  House Home Access: Stairs to enter   Home Layout: One level Home Equipment: Cane - single point;Grab bars - tub/shower      Prior Function Level of Independence: Independent with assistive device(s)      Comments: Ambulatory with SPC.  Pt reports 1 fall around Thanksgiving time outside.   PT Goals (current goals can now be found in the care plan section) Acute Rehab PT Goals Patient Stated Goal: to go home PT Goal Formulation: With patient Time For Goal Achievement: 10/21/18 Potential to Achieve Goals: Fair Progress towards PT goals: Progressing toward goals    Frequency    BID      PT Plan Current plan remains appropriate    Co-evaluation PT/OT/SLP Co-Evaluation/Treatment: Yes Reason for Co-Treatment: To address functional/ADL transfers PT goals addressed during session: Proper use of DME;Mobility/safety with mobility        AM-PAC PT "6 Clicks" Mobility   Outcome Measure  Help needed turning from your back to your side while in a flat bed without using bedrails?: A Little Help needed moving from lying on your back to sitting on the side of a flat bed without using bedrails?: A Little Help needed moving to and from a bed to a chair (including a wheelchair)?: A Little Help needed standing up from a chair using your arms (e.g., wheelchair or bedside chair)?: A Little Help needed to walk in hospital room?: A Little Help needed climbing 3-5 steps with a railing? : A Lot 6 Click Score: 17    End of Session Equipment Utilized During Treatment: Gait belt Activity Tolerance: Patient tolerated treatment well Patient left: in chair;Other (comment)(OT present for session (OT to set pt up in chair end of OT session)) Nurse Communication: Mobility status;Precautions;Weight bearing status;Other (comment)(pt toileted) PT Visit Diagnosis: Other abnormalities of gait and mobility (R26.89);Muscle weakness (generalized) (M62.81);History of falling (Z91.81);Difficulty in walking,  not elsewhere classified (R26.2);Pain Pain - Right/Left: Left Pain - part of body: Hip     Time: 5784-6962 PT Time Calculation (min) (ACUTE ONLY): 29 min  Charges:  $Gait Training: 8-22 mins $Therapeutic Activity: 8-22 mins                     Leitha Bleak, PT 10/07/18, 2:24 PM [phone]

## 2018-10-07 NOTE — Evaluation (Signed)
Occupational Therapy Evaluation Patient Details Name: Olivia Rodriguez MRN: [id number] DOB: Apr 20, 1951 Today's Date: 10/07/2018    History of Present Illness Pt is a 68 y.o. female s/p L THA anterior approach 10/06/18.   Clinical Impression   Ms. Alvarenga was seen for OT evaluation this date, POD#1 from above surgery. Pt was independent in all ADLs prior to surgery, however occasionally using SPC for mobility due to L hip pain. Pt is eager to return to PLOF with less pain and improved safety and independence. Pt currently requires min to mod assist for LB dressing and bathing while in seated position due to pain and limited AROM of L hip. Pt instructed in self care skills, falls prevention strategies, home/routines modifications, and DME/AE for LB bathing and dressing tasks. Pt would benefit from additional instruction in self care skills and techniques to help maintain precautions with or without assistive devices to support recall and carryover prior to discharge. Recommend HHOT upon discharge.      Follow Up Recommendations  Home health OT    Equipment Recommendations  3 in 1 bedside commode;Tub/shower bench;Toilet rise with handles    Recommendations for Other Services       Precautions / Restrictions Precautions Precautions: Fall;Anterior Hip Precaution Booklet Issued: Yes (comment) Restrictions Weight Bearing Restrictions: Yes LLE Weight Bearing: Partial weight bearing LLE Partial Weight Bearing Percentage or Pounds: 50%      Mobility Bed Mobility Overal bed mobility: Needs Assistance Bed Mobility: Supine to Sit     Supine to sit: Min assist;HOB elevated Sit to supine: +2 for safety/equipment   General bed mobility comments: min assist for L LE semi-supine to sit with use of bed rail and increased effort to perform; vc's for technique required  Transfers Overall transfer level: Needs assistance Equipment used: Rolling walker (2 wheeled) Transfers: Sit to/from [employer] Sit to Stand: Min guard Stand pivot transfers: Min guard       General transfer comment: extra time for pt to problem solve how to stand from bed; vc's for UE/LE placement; CGA to stand from bed (using momentum) and CGA to stand from [facility]    Balance Overall balance assessment: Needs assistance Sitting-balance support: No upper extremity supported;Feet supported Sitting balance-Leahy Scale: Good Sitting balance - Comments: steady sitting reaching within BOS   Standing balance support: Single extremity supported Standing balance-Leahy Scale: Poor Standing balance comment: pt requiring at least single UE support for static standing balance                           ADL either performed or assessed with clinical judgement   ADL Overall ADL's : Needs assistance/impaired Eating/Feeding: Set up;Independent;Sitting   Grooming: Set up;Sitting;Supervision/safety   Upper Body Bathing: Sitting;Set up;Minimal assistance   Lower Body Bathing: Set up;Moderate assistance;With adaptive equipment;Cueing for safety;Sit to/from stand   Upper Body Dressing : Set up;Sitting;Minimal assistance   Lower Body Dressing: Set up;Moderate assistance;With adaptive equipment;Cueing for safety;Sit to/from stand   Toilet Transfer: Set up;Ambulation;BSC;RW;+2 for safety/equipment;Min guard   Toileting- Clothing Manipulation and Hygiene: Set up;Sit to/from stand;Min guard;Cueing for safety Toileting - Clothing Manipulation Details (indicate cue type and reason): Pt able to come sit to stand to complete toilet hygiene. Req min guard for safety and VC for adhereing to WB status.      Functional mobility during ADLs: Min guard;Rolling walker;+2 for safety/equipment       Vision Baseline Vision/History: Wears glasses Wears Glasses: Reading  only Patient Visual Report: No change from baseline       Perception     Praxis      Pertinent Vitals/Pain Pain Assessment: 0-10 Pain  Score: 2  Pain Location: L hip Pain Descriptors / Indicators: Sore;Discomfort;Operative site guarding;Grimacing Pain Intervention(s): Limited activity within patient's tolerance;Monitored during session;Repositioned     Hand Dominance     Extremity/Trunk Assessment Upper Extremity Assessment Upper Extremity Assessment: Overall WFL for tasks assessed   Lower Extremity Assessment Lower Extremity Assessment: Defer to PT evaluation;LLE deficits/detail RLE Deficits / Details: strength and ROM WFL LLE Deficits / Details: s/p THA  LLE: Unable to fully assess due to pain LLE Coordination: decreased gross motor   Cervical / Trunk Assessment Cervical / Trunk Assessment: Normal   Communication Communication Communication: No difficulties   Cognition Arousal/Alertness: Awake/alert Behavior During Therapy: WFL for tasks assessed/performed Overall Cognitive Status: Within Functional Limits for tasks assessed                                     General Comments  OT/PT co session with PT in for tx and OT for eval of functional transfers and use of BSC. +2 also required for mgmt of equipment and pt safety.     Exercises Total Joint Exercises Ankle Circles/Pumps: AROM;Strengthening;Both;10 reps;Supine Quad Sets: AROM;Strengthening;Both;10 reps;Supine Short Arc Quad: AROM;Strengthening;Left;10 reps;Supine Heel Slides: AAROM;Strengthening;Left;10 reps;Supine Hip ABduction/ADduction: AAROM;Strengthening;Left;10 reps;Supine Other Exercises Other Exercises: Pt educated on safe use of AE/DME for LB dressing and ADLs. Pt and provider problem solved potential DME options that would be most effective to support her return to meaningful occupations upon hospital DC. Utilized room computer for demonstration purposes.  Other Exercises: Provided reinforcement of education on PWB status.    Shoulder Instructions      Home Living Family/patient expects to be discharged to:: Private  residence Living Arrangements: Alone Available Help at Discharge: Family;Available 24 hours/day(Dtr coming to stay with Pt for about 2 weeks. ) Type of Home: House Home Access: Stairs to enter [employer] of Steps: 2 steps with R railing from back (where she parks the car) and 4-5 steps with L railing from front Entrance Stairs-Rails: Left;Right Home Layout: One level     Bathroom Shower/Tub: Teacher, early years/pre: Standard Bathroom Accessibility: Yes How Accessible: Accessible via walker Home Equipment: Leon - single point;Grab bars - tub/shower          Prior Functioning/Environment Level of Independence: Independent with assistive device(s)        Comments: Ambulatory with SPC.  Pt reports 1 fall around Thanksgiving time outside.        OT Problem List: Decreased strength;Decreased range of motion;Decreased activity tolerance;Impaired balance (sitting and/or standing);Decreased coordination;Decreased safety awareness;Decreased knowledge of use of DME or AE;Impaired sensation;Pain      OT Treatment/Interventions: Self-care/ADL training;Therapeutic exercise;Patient/family education;Therapeutic activities;DME and/or AE instruction    OT Goals(Current goals can be found in the care plan section) Acute Rehab OT Goals Patient Stated Goal: to go home OT Goal Formulation: With patient Time For Goal Achievement: 10/21/18 Potential to Achieve Goals: Good ADL Goals Pt Will Perform Grooming: with supervision;with adaptive equipment;standing(With LRAD for safety.) Pt Will Perform Lower Body Bathing: sit to/from stand;with adaptive equipment;with supervision(With LRAD for safety and improved functional independence.) Pt Will Perform Lower Body Dressing: with adaptive equipment;with supervision;sit to/from stand(With LRAD for safety and improved functional independence.) Pt Will Transfer to  Toilet: bedside commode;ambulating;grab bars;with supervision(With LRAD  for safety and improved functional independence.) Additional ADL Goal #1: Upon DC, pt will independently educate a caregiver on learned strategies for maintaining PWB status during functional mobility and transfers.  OT Frequency: Min 1X/week   Barriers to D/C: Inaccessible home environment  Pt with steps to enter the home.        Co-evaluation PT/OT/SLP Co-Evaluation/Treatment: Yes Reason for Co-Treatment: To address functional/ADL transfers PT goals addressed during session: Proper use of DME;Mobility/safety with mobility OT goals addressed during session: ADL's and self-care      AM-PAC OT "6 Clicks" Daily Activity     Outcome Measure Help from another person eating meals?: A Little Help from another person taking care of personal grooming?: A Little Help from another person toileting, which includes using toliet, bedpan, or urinal?: A Little Help from another person bathing (including washing, rinsing, drying)?: A Lot Help from another person to put on and taking off regular upper body clothing?: A Little Help from another person to put on and taking off regular lower body clothing?: A Lot 6 Click Score: 16   End of Session Equipment Utilized During Treatment: Gait belt;Rolling walker(Wound Vac in place L LE. ) Nurse Communication: (Pt up in recliner )  Activity Tolerance: Patient tolerated treatment well Patient left: in chair;with chair alarm set;with family/visitor present;with SCD's reapplied(With wound vac in place. )  OT Visit Diagnosis: Unsteadiness on feet (R26.81);Other abnormalities of gait and mobility (R26.89);History of falling (Z91.81);Pain Pain - Right/Left: Left Pain - part of body: Hip                Time: 1340-1429 OT Time Calculation (min): 49 min Charges:  OT General Charges $OT Visit: 1 Visit OT Evaluation $OT Eval Low Complexity: 1 Low OT Treatments $Self Care/Home Management : 23-37 mins  Shara Blazing, M.S., OTR/L Ascom:  [phone] 10/07/18, 3:05 PM

## 2018-10-08 LAB — BASIC METABOLIC PANEL
Anion gap: 1 — ABNORMAL LOW (ref 5–15)
BUN: 20 mg/dL (ref 8–23)
CO2: 30 mmol/L (ref 22–32)
CREATININE: 0.73 mg/dL (ref 0.44–1.00)
Calcium: 8.9 mg/dL (ref 8.9–10.3)
Chloride: 105 mmol/L (ref 98–111)
GFR calc Af Amer: >60 mL/min (ref >60)
GFR calc non Af Amer: >60 mL/min (ref >60)
Glucose, Bld: 115 mg/dL — ABNORMAL HIGH (ref 70–99)
Potassium: 4.8 mmol/L (ref 3.5–5.1)
SODIUM: 136 mmol/L (ref 135–145)

## 2018-10-08 LAB — CBC
HCT: 35.5 % — ABNORMAL LOW (ref 36.0–46.0)
Hemoglobin: 11.5 g/dL — ABNORMAL LOW (ref 12.0–15.0)
MCH: 30.8 pg (ref 26.0–34.0)
MCHC: 32.4 g/dL (ref 30.0–36.0)
MCV: 95.2 fL (ref 80.0–100.0)
Platelets: 230 10*3/uL (ref 150–400)
RBC: 3.73 MIL/uL — ABNORMAL LOW (ref 3.87–5.11)
RDW: 12.1 % (ref 11.5–15.5)
WBC: 7.5 10*3/uL (ref 4.0–10.5)
nRBC: 0 % (ref 0.0–0.2)

## 2018-10-08 LAB — SURGICAL PATHOLOGY

## 2018-10-08 MED ORDER — ENOXAPARIN SODIUM 40 MG/0.4ML ~~LOC~~ SOLN: 40.0000 mg | Freq: Every day | SUBCUTANEOUS | 0 refills | Status: DC

## 2018-10-08 MED ORDER — METHOCARBAMOL 500 MG PO TABS: 500.0000 mg | ORAL_TABLET | Freq: Four times a day (QID) | ORAL | 0 refills | Status: DC | PRN

## 2018-10-08 MED ORDER — ACETAMINOPHEN 325 MG PO TABS: 325.0000 mg | ORAL_TABLET | Freq: Four times a day (QID) | ORAL | Status: DC | PRN

## 2018-10-08 MED ORDER — OXYCODONE HCL 10 MG PO TABS: 10.0000 mg | ORAL_TABLET | ORAL | 0 refills | Status: DC | PRN

## 2018-10-08 MED ORDER — BISACODYL 10 MG RE SUPP
10.0000 mg | Freq: Every day | RECTAL | Status: DC | PRN
  Administered 2018-10-09: 10 mg via RECTAL
  Filled 2018-10-08 (×2): qty 1

## 2018-10-08 MED ORDER — DOCUSATE SODIUM 100 MG PO CAPS: 100.0000 mg | ORAL_CAPSULE | Freq: Two times a day (BID) | ORAL | 0 refills | Status: DC

## 2018-10-08 NOTE — Progress Notes (Signed)
Physical Therapy Treatment Patient Details Name: Olivia Rodriguez MRN: [id number] DOB: Mar 13, 1951 Today's Date: 10/08/2018    History of Present Illness Pt is a 68 y.o. female s/p L THA anterior approach 10/06/18.    PT Comments    Pt slowly progressing with ambulation distance (14 feet with youth sized bariatric RW) and requiring min assist to stand up to walker.  Pt still requiring cueing for transfer technique and for PWB'ing status with activity.  Will continue to progress pt with strengthening and progressive functional mobility per pt tolerance.    Follow Up Recommendations  SNF     Equipment Recommendations  Rolling walker with 5" wheels;3in1 (PT)(youth sized bariatric)    Recommendations for Other Services OT consult     Precautions / Restrictions Precautions Precautions: Fall;Anterior Hip Precaution Booklet Issued: Yes (comment) Restrictions Weight Bearing Restrictions: Yes LLE Weight Bearing: Partial weight bearing LLE Partial Weight Bearing Percentage or Pounds: 50%    Mobility  Bed Mobility         General bed mobility comments: Deferred (pt up in chair beginning/end of session)  Transfers Overall transfer level: Needs assistance Equipment used: Rolling walker (2 wheeled) Transfers: Sit to/from Stand Sit to Stand: Min assist       General transfer comment: assist to initiate stand up to walker; vc's for UE and LE placement  Ambulation/Gait Ambulation/Gait assistance: Min guard Gait Distance (Feet): 14 Feet Assistive device: (youth sized bariatric RW)   Gait velocity: decreased   General Gait Details: intermittent vc's for PWB'ing status and to increase UE support through RW; limited distance d/t fatigue and generalized weakness   Stairs             Wheelchair Mobility    Modified Rankin (Stroke Patients Only)       Balance Overall balance assessment: Needs assistance Sitting-balance support: No upper extremity supported;Feet  supported Sitting balance-Leahy Scale: Good Sitting balance - Comments: steady sitting reaching within BOS   Standing balance support: Single extremity supported Standing balance-Leahy Scale: Poor Standing balance comment: pt requiring at least single UE support for static standing balance                            Cognition Arousal/Alertness: Awake/alert Behavior During Therapy: WFL for tasks assessed/performed Overall Cognitive Status: Within Functional Limits for tasks assessed                                        Exercises     General Comments  Pt agreeable to PT session.      Pertinent Vitals/Pain Pain Assessment: 0-10 Pain Score: 3  Pain Location: L hip Pain Descriptors / Indicators: Sore;Discomfort Pain Intervention(s): Limited activity within patient's tolerance;Monitored during session;Repositioned    Home Living                      Prior Function            PT Goals (current goals can now be found in the care plan section) Acute Rehab PT Goals Patient Stated Goal: to go home PT Goal Formulation: With patient Time For Goal Achievement: 10/21/18 Potential to Achieve Goals: Fair Progress towards PT goals: Progressing toward goals    Frequency    BID      PT Plan Current plan remains appropriate    Co-evaluation  AM-PAC PT "6 Clicks" Mobility   Outcome Measure  Help needed turning from your back to your side while in a flat bed without using bedrails?: A Little Help needed moving from lying on your back to sitting on the side of a flat bed without using bedrails?: A Little Help needed moving to and from a bed to a chair (including a wheelchair)?: A Little Help needed standing up from a chair using your arms (e.g., wheelchair or bedside chair)?: A Little Help needed to walk in hospital room?: A Little Help needed climbing 3-5 steps with a railing? : A Lot 6 Click Score: 17    End of  Session Equipment Utilized During Treatment: Gait belt Activity Tolerance: Patient limited by fatigue Patient left: in chair;with call bell/phone within reach;with chair alarm set;with SCD's reapplied;Other (comment)(L LE and L heel elevated via pillow) Nurse Communication: Mobility status;Precautions;Weight bearing status PT Visit Diagnosis: Other abnormalities of gait and mobility (R26.89);Muscle weakness (generalized) (M62.81);History of falling (Z91.81);Difficulty in walking, not elsewhere classified (R26.2);Pain Pain - Right/Left: Left Pain - part of body: Hip     Time: 4174-0814 PT Time Calculation (min) (ACUTE ONLY): 23 min  Charges:  $Gait Training: 8-22 mins $Therapeutic Activity: 8-22 mins                    Leitha Bleak, PT 10/08/18, 3:06 PM [phone]

## 2018-10-08 NOTE — Discharge Instructions (Signed)
ANTERIOR APPROACH TOTAL HIP REPLACEMENT POSTOPERATIVE DIRECTIONS   Hip Rehabilitation, Guidelines Following Surgery  The results of a hip operation are greatly improved after range of motion and muscle strengthening exercises. Follow all safety measures which are given to protect your hip. If any of these exercises cause increased pain or swelling in your joint, decrease the amount until you are comfortable again. Then slowly increase the exercises. Call your caregiver if you have problems or questions.   HOME CARE INSTRUCTIONS  Remove items at home which could result in a fall. This includes throw rugs or furniture in walking pathways.   ICE to the affected hip every three hours for 30 minutes at a time and then as needed for pain and swelling.  Continue to use ice on the hip for pain and swelling from surgery. You may notice swelling that will progress down to the foot and ankle.  This is normal after surgery.  Elevate the leg when you are not up walking on it.    Continue to use the breathing machine which will help keep your temperature down.  It is common for your temperature to cycle up and down following surgery, especially at night when you are not up moving around and exerting yourself.  The breathing machine keeps your lungs expanded and your temperature down.  Do not place pillow under knee, focus on keeping the knee straight while resting  DIET You may resume your previous home diet once your are discharged from the hospital.  DRESSING / WOUND CARE / SHOWERING Please remove provena negative pressure dressing on 10/16/2018 and apply honey comb dressing. Keep dressing clean and dry at all times.  ACTIVITY Walk with your walker as instructed. Use walker as long as suggested by your caregivers. Avoid periods of inactivity such as sitting longer than an hour when not asleep. This helps prevent blood clots.  You may resume a sexual relationship in one month or when given the OK by  your doctor.  You may return to work once you are cleared by your doctor.  Do not drive a car for 6 weeks or until released by you surgeon.  Do not drive while taking narcotics.  WEIGHT BEARING Weight bearing as tolerated. Use walker/cane as needed for at least 4 weeks post op.  POSTOPERATIVE CONSTIPATION PROTOCOL Constipation - defined medically as fewer than three stools per week and severe constipation as less than one stool per week.  One of the most common issues patients have following surgery is constipation.  Even if you have a regular bowel pattern at home, your normal regimen is likely to be disrupted due to multiple reasons following surgery.  Combination of anesthesia, postoperative narcotics, change in appetite and fluid intake all can affect your bowels.  In order to avoid complications following surgery, here are some recommendations in order to help you during your recovery period.  Colace (docusate) - Pick up an over-the-counter form of Colace or another stool softener and take twice a day as long as you are requiring postoperative pain medications.  Take with a full glass of water daily.  If you experience loose stools or diarrhea, hold the colace until you stool forms back up.  If your symptoms do not get better within 1 week or if they get worse, check with your doctor.  Dulcolax (bisacodyl) - Pick up over-the-counter and take as directed by the product packaging as needed to assist with the movement of your bowels.  Take with a full  glass of water.  Use this product as needed if not relieved by Colace only.  ° °MiraLax (polyethylene glycol) - Pick up over-the-counter to have on hand.  MiraLax is a solution that will increase the amount of water in your bowels to assist with bowel movements.  Take as directed and can mix with a glass of water, juice, soda, coffee, or tea.  Take if you go more than two days without a movement. °Do not use MiraLax more than once per day. Call your  doctor if you are still constipated or irregular after using this medication for 7 days in a row. ° °If you continue to have problems with postoperative constipation, please contact the office for further assistance and recommendations.  If you experience "the worst abdominal pain ever" or develop nausea or vomiting, please contact the office immediatly for further recommendations for treatment. ° °ITCHING ° If you experience itching with your medications, try taking only a single pain pill, or even half a pain pill at a time.  You can also use Benadryl over the counter for itching or also to help with sleep.  ° °TED HOSE STOCKINGS °Wear the elastic stockings on both legs for six weeks following surgery during the day but you may remove then at night for sleeping. ° °MEDICATIONS °See your medication summary on the “After Visit Summary” that the nursing staff will review with you prior to discharge.  You may have some home medications which will be placed on hold until you complete the course of blood thinner medication.  It is important for you to complete the blood thinner medication as prescribed by your surgeon.  Continue your approved medications as instructed at time of discharge. ° °PRECAUTIONS °If you experience chest pain or shortness of breath - call 911 immediately for transfer to the hospital emergency department.  °If you develop a fever greater that 101 F, purulent drainage from wound, increased redness or drainage from wound, foul odor from the wound/dressing, or calf pain - CONTACT YOUR SURGEON.   °                                                °FOLLOW-UP APPOINTMENTS °Make sure you keep all of your appointments after your operation with your surgeon and caregivers. You should call the office at the above phone number and make an appointment for approximately two weeks after the date of your surgery or on the date instructed by your surgeon outlined in the "After Visit Summary". ° °RANGE OF MOTION  AND STRENGTHENING EXERCISES  °These exercises are designed to help you keep full movement of your hip joint. Follow your caregiver's or physical therapist's instructions. Perform all exercises about fifteen times, three times per day or as directed. Exercise both hips, even if you have had only one joint replacement. These exercises can be done on a training (exercise) mat, on the floor, on a table or on a bed. Use whatever works the best and is most comfortable for you. Use music or television while you are exercising so that the exercises are a pleasant break in your day. This will make your life better with the exercises acting as a break in routine you can look forward to.  °Lying on your back, slowly slide your foot toward your buttocks, raising your knee up off the floor. Then slowly   slide your foot back down until your leg is straight again.  Lying on your back spread your legs as far apart as you can without causing discomfort.  Lying on your side, raise your upper leg and foot straight up from the floor as far as is comfortable. Slowly lower the leg and repeat.  Lying on your back, tighten up the muscle in the front of your thigh (quadriceps muscles). You can do this by keeping your leg straight and trying to raise your heel off the floor. This helps strengthen the largest muscle supporting your knee.  Lying on your back, tighten up the muscles of your buttocks both with the legs straight and with the knee bent at a comfortable angle while keeping your heel on the floor.   IF YOU ARE TRANSFERRED TO A SKILLED REHAB FACILITY If the patient is transferred to a skilled rehab facility following release from the hospital, a list of the current medications will be sent to the facility for the patient to continue.  When discharged from the skilled rehab facility, please have the facility set up the patient's [place] prior to being released. Also, the skilled facility will be responsible  for providing the patient with their medications at time of release from the facility to include their pain medication, the muscle relaxants, and their blood thinner medication. If the patient is still at the rehab facility at time of the two week follow up appointment, the skilled rehab facility will also need to assist the patient in arranging follow up appointment in our office and any transportation needs.  MAKE SURE YOU:  Understand these instructions.  Get help right away if you are not doing well or get worse.    Pick up stool softner and laxative for home use following surgery while on pain medications. Continue to use ice for pain and swelling after surgery. Do not use any lotions or creams on the incision until instructed by your surgeon.

## 2018-10-08 NOTE — Care Management (Signed)
PT notified me that the plan has changed and now they recommend Rehab.  Notified Brad with [facility] to be on hold for the DME RW and BSC, Notified Helene Kelp from [facility] that the plan has changed and the patient may go to rehab instead of Home with home health.

## 2018-10-08 NOTE — Discharge Summary (Signed)
Physician Discharge Summary  Patient ID: Olivia Rodriguez MRN: [id number] DOB/AGE: Aug 13, 1951 68 y.o.  Admit date: 10/06/2018 Discharge date: 10/09/2018 Admission Diagnoses:  PRIMARY OSTEOARTHRITIS LEFT HIP   Discharge Diagnoses: Patient Active Problem List   Diagnosis Date Noted  . Status post total hip replacement, left 10/06/2018    Past Medical History:  Diagnosis Date  . Arthritis      Transfusion: none   Consultants (if any):   Discharged Condition: Improved  Hospital Course: Olivia Rodriguez is an 68 y.o. female who was admitted 10/06/2018 with a diagnosis of hip osteoarthritis and went to the operating room on 10/06/2018 and underwent the above named procedures.    Surgeries: Procedure(s): TOTAL HIP ARTHROPLASTY ANTERIOR APPROACH-LEFT on 10/06/2018 Patient tolerated the surgery well. Taken to PACU where she was stabilized and then transferred to the orthopedic floor.  Started on Lovenox 40 mg q 12 hrs. Foot pumps applied bilaterally at 80 mm. Heels elevated on bed with rolled towels. No evidence of DVT. Negative Homan. Physical therapy started on day #1 for gait training and transfer. OT started day #1 for ADL and assisted devices.  Patient's foley was d/c on day #1. Patient's IV  was d/c on day #2.  On post op day #3 patient was stable and ready for discharge to SNF.  Implants: Medacta AMIS lateralized 1 stem, 50 mm Mpact DM cup and liner with metal S 28 mm head  She was given perioperative antibiotics:  Anti-infectives (From admission, onward)   Start     Dose/Rate Route Frequency Ordered Stop   10/06/18 1900  clindamycin (CLEOCIN) IVPB 900 mg     900 mg 100 mL/hr over 30 Minutes Intravenous Every 6 hours 10/06/18 1550 10/07/18 0642   10/06/18 1320  gentamicin (GARAMYCIN) 80 mg in sodium chloride 0.9 % 500 mL irrigation  Status:  Discontinued       As needed 10/06/18 1321 10/06/18 1420   10/05/18 2215  clindamycin (CLEOCIN) IVPB 900 mg     900 mg 100 mL/hr over 30  Minutes Intravenous  Once 10/05/18 2210 10/06/18 1323    .  She was given sequential compression devices, early ambulation, and Lovenox for DVT prophylaxis.  She benefited maximally from the hospital stay and there were no complications.     Please remove provena negative pressure dressing on 10/16/2018 and apply honey comb dressing. Keep dressing clean and dry at all times.   Recent vital signs:  Vitals:   10/08/18 0752 10/08/18 1629  BP: (!) 157/84 (!) 166/80  Pulse: 74 (!) 55  Resp:    Temp: 98 F (36.7 C) 99.1 F (37.3 C)  SpO2: 97% 99%    Recent laboratory studies:  Lab Results  Component Value Date   HGB 11.5 (L) 10/08/2018   HGB 11.4 (L) 10/07/2018   HGB 13.1 10/06/2018   Lab Results  Component Value Date   WBC 7.5 10/08/2018   PLT 230 10/08/2018   Lab Results  Component Value Date   INR 0.99 09/29/2018   Lab Results  Component Value Date   NA 136 10/08/2018   K 4.8 10/08/2018   CL 105 10/08/2018   CO2 30 10/08/2018   BUN 20 10/08/2018   CREATININE 0.73 10/08/2018   GLUCOSE 115 (H) 10/08/2018    Discharge Medications:   Allergies as of 10/08/2018      Reactions   Penicillins Hives, Other (See Comments)   Did it involve swelling of the face/tongue/throat, SOB, or low BP? No Did  it involve sudden or severe rash/hives, skin peeling, or any reaction on the inside of your mouth or nose? Yes Did you need to seek medical attention at a hospital or doctor's office? Yes When did it last happen? Over 60 years ago If all above answers are "NO", may proceed with cephalosporin use.      Medication List    STOP taking these medications   acetaminophen 650 MG CR tablet Commonly known as:  TYLENOL Replaced by:  acetaminophen 325 MG tablet   ibuprofen 200 MG tablet Commonly known as:  ADVIL,MOTRIN     TAKE these medications   acetaminophen 325 MG tablet Commonly known as:  TYLENOL Take 1-2 tablets (325-650 mg total) by mouth every 6 (six) hours as  needed for mild pain (pain score 1-3 or temp > 100.5). Replaces:  acetaminophen 650 MG CR tablet   docusate sodium 100 MG capsule Commonly known as:  COLACE Take 1 capsule (100 mg total) by mouth 2 (two) times daily.   enoxaparin 40 MG/0.4ML injection Commonly known as:  LOVENOX Inject 0.4 mLs (40 mg total) into the skin daily for 14 days.   methocarbamol 500 MG tablet Commonly known as:  ROBAXIN Take 1 tablet (500 mg total) by mouth every 6 (six) hours as needed for muscle spasms.   MYRBETRIQ 25 MG Tb24 tablet Generic drug:  mirabegron ER Take 25 mg by mouth daily.   Oxycodone HCl 10 MG Tabs Take 1 tablet (10 mg total) by mouth every 3 (three) hours as needed for moderate pain.       Diagnostic Studies: Dg Hip Operative Unilat W Or W/o Pelvis Left  Result Date: 10/06/2018 CLINICAL DATA:  Left total hip replacement.  FT: 36 secs EXAM: OPERATIVE LEFT HIP (WITH PELVIS IF PERFORMED) 3 VIEWS TECHNIQUE: Fluoroscopic spot image(s) were submitted for interpretation post-operatively. COMPARISON:  None. FINDINGS: Initial image demonstrates native hip with significant joint space narrowing and large osteophytes. Subsequent images demonstrate stages of ORIF. There is no evidence for dislocation. Hardware is intact. IMPRESSION: Status post LEFT hip arthroplasty. Electronically Signed   By: Nolon Nations M.D.   On: 10/06/2018 14:23   Dg Hip Unilat W Or W/o Pelvis 2-3 Views Left  Result Date: 10/06/2018 CLINICAL DATA:  Status post left total hip arthroplasty EXAM: DG HIP (WITH OR WITHOUT PELVIS) 2-3V LEFT COMPARISON:  None. FINDINGS: Expected postsurgical changes from left total hip arthroplasty, with no left hip dislocation and with no evidence of hardware fracture or loosening. No osseous fracture. Skin staples are noted lateral to the left hip. IMPRESSION: Satisfactory immediate postoperative appearance status post left total hip arthroplasty. Electronically Signed   By: Ilona Sorrel M.D.    On: 10/06/2018 15:16    Disposition:      Contact information for follow-up providers    Duanne Guess, PA-C Follow up in 14 day(s).   Specialties:  Orthopedic Surgery, Emergency Medicine Contact information: [place] [address] [phone]            Contact information for after-discharge care    Destination    [facility] Preferred SNF .   Service:  Skilled Nursing Contact information: [address] [place] [phone]                   Signed: Feliberto Gottron 10/08/2018, 4:57 PM

## 2018-10-08 NOTE — Clinical Social Work Placement (Signed)
   CLINICAL SOCIAL WORK PLACEMENT  NOTE  Date:  10/08/2018  Patient Details  Name: Olivia Rodriguez MRN: [id number] Date of Birth: [birth year]  Clinical Social Work is seeking post-discharge placement for this patient at the [place] level of care (*CSW will initial, date and re-position this form in  chart as items are completed):  Yes   Patient/family provided with [place] Work Department's list of facilities offering this level of care within the geographic area requested by the patient (or if unable, by the patient's family).  Yes   Patient/family informed of their freedom to choose among providers that offer the needed level of care, that participate in Medicare, Medicaid or managed care program needed by the patient, have an available bed and are willing to accept the patient.  Yes   Patient/family informed of [facility] [place]'s ownership interest in [facility] and [facility], as well as of the fact that they are under no obligation to receive care at these facilities.  PASRR submitted to EDS on 10/08/18     PASRR number received on 10/08/18     Existing PASRR number confirmed on       FL2 transmitted to all facilities in geographic area requested by pt/family on 10/06/18     FL2 transmitted to all facilities within larger geographic area on       Patient informed that his/her managed care company has contracts with or will negotiate with certain facilities, including the following:        Yes   Patient/family informed of bed offers received.  [facility] at ([facility] )     Physician recommends and patient chooses bed at      Patient to be transferred to   on  .  Patient to be transferred to facility by       Patient family notified on   of transfer.  Name of family member notified:        PHYSICIAN       Additional Comment:    _______________________________________________ Keevan Wolz, Veronia Beets, LCSW 10/08/2018, 5:13 PM

## 2018-10-08 NOTE — Progress Notes (Signed)
   Subjective: 2 Days Post-Op Procedure(s) (LRB): TOTAL HIP ARTHROPLASTY ANTERIOR APPROACH-LEFT (Left) Patient reports pain as moderate.   Patient is well, and has had no acute complaints or problems Denies any CP, SOB, ABD pain. We will continue therapy today.  Plan is to go Home after hospital stay.  Objective: Vital signs in last 24 hours: Temp:  [98 F (36.7 C)-98.7 F (37.1 C)] 98 F (36.7 C) (02/13 0752) Pulse Rate:  [51-74] 74 (02/13 0752) Resp:  [18] 18 (02/12 2316) BP: (132-174)/(58-84) 157/84 (02/13 0752) SpO2:  [97 %-100 %] 97 % (02/13 0752)  Intake/Output from previous day: 02/12 0701 - 02/13 0700 In: 240 [P.O.:240] Out: 400 [Urine:400] Intake/Output this shift: No intake/output data recorded.  Recent Labs    10/06/18 1625 10/07/18 0502 10/08/18 0430  HGB 13.1 11.4* 11.5*   Recent Labs    10/07/18 0502 10/08/18 0430  WBC 10.0 7.5  RBC 3.66* 3.73*  HCT 34.4* 35.5*  PLT 226 230   Recent Labs    10/07/18 0502 10/08/18 0430  NA 131* 136  K 4.1 4.8  CL 99 105  CO2 28 30  BUN 18 20  CREATININE 0.74 0.73  GLUCOSE 124* 115*  CALCIUM 8.5* 8.9   No results for input(s): LABPT, INR in the last 72 hours.  EXAM General - Patient is Alert, Appropriate and Oriented Extremity - Neurovascular intact Sensation intact distally Intact pulses distally Dorsiflexion/Plantar flexion intact No cellulitis present Compartment soft Dressing - dressing C/D/I and no drainage, prevena intact with out drainage Motor Function - intact, moving foot and toes well on exam.   Past Medical History:  Diagnosis Date  . Arthritis     Assessment/Plan:   2 Days Post-Op Procedure(s) (LRB): TOTAL HIP ARTHROPLASTY ANTERIOR APPROACH-LEFT (Left) Active Problems:   Status post total hip replacement, left  Estimated body mass index is 45.71 kg/m as calculated from the following:   Height as of this encounter: 5' 2.5" (1.588 m).   Weight as of this encounter: 115.2  kg. Advance diet Up with therapy  Needs BM Labs stable VSS CM to assist with discharge to home with HHPT Friday  DVT Prophylaxis - Lovenox, TED hose and SCDs Weight-Bearing as tolerated to left leg   T. Rachelle Hora, PA-C [place] 10/08/2018, 8:18 AM

## 2018-10-08 NOTE — Progress Notes (Signed)
Physical Therapy Treatment Patient Details Name: Olivia Rodriguez MRN: [id number] DOB: Mar 09, 1951 Today's Date: 10/08/2018    History of Present Illness Pt is a 68 y.o. female s/p L THA anterior approach 10/06/18.    PT Comments    Pt requesting to toilet during session.  Increased time and effort required for functional mobility during session compared to yesterday (pt reports having a lot of pain last night trying to get up to toilet).  5-6/10 L hip pain during session.  Pt requiring min assist to stand from bed and CGA to stand from [facility].  Limited distance ambulating to 8 feet with walker d/t pt fatigue and pt feeling hot and sweaty; pt also still requiring cueing for PWB'ing status.  Pt is not progressing as expected for safe home discharge.  Will update discharge recommendations to STR (CM notified who reported she would notify SW).  Will continue to progress pt with strengthening and progressive functional mobility per pt tolerance.    Follow Up Recommendations  SNF     Equipment Recommendations  Rolling walker with 5" wheels;3in1 (PT)(youth sized bariatric)    Recommendations for Other Services OT consult     Precautions / Restrictions Precautions Precautions: Fall;Anterior Hip Precaution Booklet Issued: Yes (comment) Restrictions Weight Bearing Restrictions: Yes LLE Weight Bearing: Partial weight bearing LLE Partial Weight Bearing Percentage or Pounds: 50%    Mobility  Bed Mobility Overal bed mobility: Needs Assistance Bed Mobility: Supine to Sit     Supine to sit: Min assist;HOB elevated     General bed mobility comments: min assist for L LE semi-supine to sit with use of bed rail and increased effort and time to perform; vc's for technique required  Transfers Overall transfer level: Needs assistance Equipment used: Rolling walker (2 wheeled) Transfers: Sit to/from [facility] Sit to Stand: Min assist;Min guard Stand pivot transfers: Min guard        General transfer comment: x2 trials standing from bed with min assist and extra time/effort to perform (max vc's for UE/LE placement each time); CGA standing from [facility]; CGA stand step turn bed to/from [facility] with walker  Ambulation/Gait Ambulation/Gait assistance: Min guard;Min assist Gait Distance (Feet): 8 Feet Assistive device: (Bariatric youth sized RW)   Gait velocity: decreased   General Gait Details: intermittent vc's for PWB'ing status and to increase UE support through RW; limited distance d/t pt feeling hot/sweaty and fatigue   Stairs             Wheelchair Mobility    Modified Rankin (Stroke Patients Only)       Balance Overall balance assessment: Needs assistance Sitting-balance support: No upper extremity supported;Feet supported Sitting balance-Leahy Scale: Good Sitting balance - Comments: steady sitting reaching within BOS   Standing balance support: Single extremity supported Standing balance-Leahy Scale: Poor Standing balance comment: pt requiring at least single UE support for static standing balance                            Cognition Arousal/Alertness: Awake/alert Behavior During Therapy: WFL for tasks assessed/performed Overall Cognitive Status: Within Functional Limits for tasks assessed                                        Exercises Total Joint Exercises Ankle Circles/Pumps: AROM;Strengthening;Both;10 reps;Supine Quad Sets: AROM;Strengthening;Both;10 reps;Supine Heel Slides: AAROM;Strengthening;Left;10 reps;Supine Hip ABduction/ADduction: AAROM;Strengthening;Left;10  reps;Supine    General Comments   Nursing cleared pt for participation in physical therapy.  Pt agreeable to PT session.      Pertinent Vitals/Pain Pain Assessment: 0-10 Pain Score: 5  Pain Location: L hip Pain Descriptors / Indicators: Sore;Discomfort Pain Intervention(s): Limited activity within patient's tolerance;Monitored during  session;Repositioned;Patient requesting pain meds-RN notified  Vitals (HR and O2 on room air) stable and WFL throughout treatment session.    Home Living                      Prior Function            PT Goals (current goals can now be found in the care plan section) Acute Rehab PT Goals Patient Stated Goal: to go home PT Goal Formulation: With patient Time For Goal Achievement: 10/21/18 Potential to Achieve Goals: Fair Progress towards PT goals: Progressing toward goals    Frequency    BID      PT Plan Discharge plan needs to be updated(CM notified who reported she would notify SW)    Co-evaluation              AM-PAC PT "6 Clicks" Mobility   Outcome Measure  Help needed turning from your back to your side while in a flat bed without using bedrails?: A Little Help needed moving from lying on your back to sitting on the side of a flat bed without using bedrails?: A Little Help needed moving to and from a bed to a chair (including a wheelchair)?: A Little Help needed standing up from a chair using your arms (e.g., wheelchair or bedside chair)?: A Little Help needed to walk in hospital room?: A Lot Help needed climbing 3-5 steps with a railing? : A Lot 6 Click Score: 16    End of Session Equipment Utilized During Treatment: Gait belt Activity Tolerance: Patient limited by fatigue Patient left: in chair;with call bell/phone within reach;with chair alarm set;with SCD's reapplied;Other (comment)(L LE and heel elevated via pillow) Nurse Communication: Mobility status;Precautions;Weight bearing status;Other (comment);Patient requests pain meds(Pt toileted) PT Visit Diagnosis: Other abnormalities of gait and mobility (R26.89);Muscle weakness (generalized) (M62.81);History of falling (Z91.81);Difficulty in walking, not elsewhere classified (R26.2);Pain Pain - Right/Left: Left Pain - part of body: Hip     Time: 6644-0347 PT Time Calculation (min) (ACUTE ONLY):  48 min  Charges:  $Gait Training: 8-22 mins $Therapeutic Exercise: 8-22 mins $Therapeutic Activity: 8-22 mins                     Leitha Bleak, PT 10/08/18, 11:52 AM [phone]

## 2018-10-08 NOTE — Clinical Social Work Note (Signed)
Clinical Social Work Assessment  Patient Details  Name: Olivia Rodriguez MRN: [id number] Date of Birth: August 29, 1950  Date of referral:  10/08/18               Reason for consult:  Facility Placement                Permission sought to share information with:  Chartered certified accountant granted to share information::  Yes, Verbal Permission Granted  Name::      [place]::   [place]   Relationship::     Contact Information:     Housing/Transportation Living arrangements for the past 2 months:  [place] of Information:  Patient Patient Interpreter Needed:  None Criminal Activity/Legal Involvement Pertinent to Current Situation/Hospitalization:  No - Comment as needed Significant Relationships:  Adult Children Lives with:  Self Do you feel safe going back to the place where you live?  Yes Need for family participation in patient care:  Yes (Comment)  Care giving concerns:  Patient lives in [place] alone.    Social Worker assessment / plan:  Holiday representative (CSW) received verbal consult from Chief Strategy Officer that PT has changed their recommendation from home health to SNF. CSW met with patient alone at bedside to discuss D/C plan. Patient was alert and oriented X4 and was sitting up in the chair at bedside. CSW introduced self and explained role of CSW department. Per patient she lives alone in [place] and her daughter [name] is very supportive. CSW explained that PT is recommending SNF and that [facility] will have to approve SNF. Patient is agreeable to SNF search in [facility]. FL2 complete and faxed out.   CSW presented bed offers to patient and discussed the quality measures of the facilities. [facility] chose [facility]. Per Otila Kluver [facility] liaison she will start [facility] SNF authorization today. CSW will continue to follow and assist as needed.   Employment status:  Retired Nurse, adult PT  Recommendations:  [place] / Referral to community resources:  [place]  Patient/Family's Response to care:  Patient chose [facility].   Patient/Family's Understanding of and Emotional Response to Diagnosis, Current Treatment, and Prognosis:  Patient was very pleasant and thanked CSW for assistance.   Emotional Assessment Appearance:  Appears stated age Attitude/Demeanor/Rapport:    Affect (typically observed):  Accepting, Adaptable, Pleasant Orientation:  Oriented to Self, Oriented to Place, Oriented to  Time, Oriented to Situation Alcohol / Substance use:  Not Applicable Psych involvement (Current and /or in the community):  No (Comment)  Discharge Needs  Concerns to be addressed:  Discharge Planning Concerns Readmission within the last 30 days:  No Current discharge risk:  Dependent with Mobility Barriers to Discharge:  Continued Medical Work up   [employer], Veronia Beets, LCSW 10/08/2018, 5:13 PM

## 2018-10-09 NOTE — Care Management Important Message (Signed)
Important Message  Patient Details  Name: Olivia Rodriguez MRN: [id number] Date of Birth: [birth year]   Medicare Important Message Given:  Yes    [name] [name] 10/09/2018, 10:14 AM

## 2018-10-09 NOTE — Progress Notes (Signed)
Patient is being discharged to [facility]. IV removed by Juventino Slovak, NT. Wound Vac switched over to Prevena, [place] sent with patient. Report called. Signed scripts sent with patient.

## 2018-10-09 NOTE — Clinical Social Work Placement (Addendum)
   CLINICAL SOCIAL WORK PLACEMENT  NOTE  Date:  10/09/2018  Patient Details  Name: Olivia Rodriguez MRN: [id number] Date of Birth: Apr 06, 1951  Clinical Social Work is seeking post-discharge placement for this patient at the [place] level of care (*CSW will initial, date and re-position this form in  chart as items are completed):  Yes   Patient/family provided with [place] Work Department's list of facilities offering this level of care within the geographic area requested by the patient (or if unable, by the patient's family).  Yes   Patient/family informed of their freedom to choose among providers that offer the needed level of care, that participate in Medicare, Medicaid or managed care program needed by the patient, have an available bed and are willing to accept the patient.  Yes   Patient/family informed of [facility]'s ownership interest in [facility] and [facility], as well as of the fact that they are under no obligation to receive care at these facilities.  PASRR submitted to EDS on 10/08/18     PASRR number received on 10/08/18     Existing PASRR number confirmed on       FL2 transmitted to all facilities in geographic area requested by pt/family on 10/06/18     FL2 transmitted to all facilities within larger geographic area on       Patient informed that his/her managed care company has contracts with or will negotiate with certain facilities, including the following:        Yes   Patient/family informed of bed offers received.  [facility] at ([facility] )     Physician recommends and patient chooses bed at      Patient to be transferred to ([facility] ) on 10/09/18.  Patient to be transferred to facility by [facility] )     Patient family notified on 10/09/18 of transfer.  Name of family member notified:  (CSW left patient's daughter [name] a Advertising account executive. ) Patient's daughter [name] called CSW back and is aware of  D/C today.   PHYSICIAN       Additional Comment:    _______________________________________________ Raziel Koenigs, Veronia Beets, LCSW 10/09/2018, 5:39 PM

## 2018-10-09 NOTE — Progress Notes (Signed)
Physical Therapy Treatment Patient Details Name: Olivia Rodriguez MRN: [id number] DOB: [birth year] Today's Date: 10/09/2018    History of Present Illness Pt is a 68 y.o. female s/p L THA anterior approach 10/06/18.    PT Comments    Pt requiring min assist semi-supine to sit, min assist to stand with walker from bed, and CGA to ambulate 20 feet with walker (bariatric youth sized).  Pt requiring increased time and effort to perform all mobility; ambulation distance limited d/t pt fatigue and difficulty maintaining PWB'ing status with fatigue.  Mild L hip pain with activity but no pain at rest beginning and end of session.  Will continue to progress pt with strengthening and progressive functional mobility per pt tolerance.   Follow Up Recommendations  SNF     Equipment Recommendations  Rolling walker with 5" wheels;3in1 (PT)(youth sized bariatric)    Recommendations for Other Services OT consult     Precautions / Restrictions Precautions Precautions: Fall;Anterior Hip Precaution Booklet Issued: Yes (comment) Restrictions Weight Bearing Restrictions: Yes LLE Weight Bearing: Partial weight bearing LLE Partial Weight Bearing Percentage or Pounds: 50%    Mobility  Bed Mobility Overal bed mobility: Needs Assistance Bed Mobility: Supine to Sit     Supine to sit: Min assist;HOB elevated     General bed mobility comments: assist for L LE; vc's for technique; use of bed rail; increased effort and time for pt to perform  Transfers Overall transfer level: Needs assistance Equipment used: Rolling walker (2 wheeled) Transfers: Sit to/from Stand Sit to Stand: Min assist         General transfer comment: assist to initiate stand up to walker; vc's for UE and LE placement; increased effort for pt to perform  Ambulation/Gait Ambulation/Gait assistance: Min guard Gait Distance (Feet): 20 Feet Assistive device: (youth sized bariatric RW)   Gait velocity: decreased   General Gait  Details: intermittent vc's for PWB'ing status and to increase UE support through RW; limited distance d/t fatigue, generalized weakness, and d/t pt's ability to maintain PWB'ing status   Stairs             Wheelchair Mobility    Modified Rankin (Stroke Patients Only)       Balance Overall balance assessment: Needs assistance Sitting-balance support: No upper extremity supported;Feet supported Sitting balance-Leahy Scale: Good Sitting balance - Comments: steady sitting reaching within BOS   Standing balance support: Single extremity supported Standing balance-Leahy Scale: Poor Standing balance comment: pt requiring at least single UE support for static standing balance                            Cognition Arousal/Alertness: Awake/alert Behavior During Therapy: WFL for tasks assessed/performed Overall Cognitive Status: Within Functional Limits for tasks assessed                                        Exercises Total Joint Exercises Ankle Circles/Pumps: AROM;Strengthening;Both;10 reps;Supine Quad Sets: AROM;Strengthening;Both;10 reps;Supine Short Arc Quad: AROM;Strengthening;Left;10 reps;Supine Heel Slides: AAROM;Strengthening;Left;10 reps;Supine Hip ABduction/ADduction: AAROM;Strengthening;Left;10 reps;Supine    General Comments   Nursing cleared pt for participation in physical therapy.  Pt agreeable to PT session.      Pertinent Vitals/Pain Pain Assessment: 0-10 Pain Score: 0-No pain Pain Location: L hip Pain Intervention(s): Limited activity within patient's tolerance;Monitored during session;Repositioned    Home Living  Prior Function            PT Goals (current goals can now be found in the care plan section) Acute Rehab PT Goals Patient Stated Goal: to go home PT Goal Formulation: With patient Time For Goal Achievement: 10/21/18 Potential to Achieve Goals: Fair Progress towards PT goals:  Progressing toward goals    Frequency    BID      PT Plan Current plan remains appropriate    Co-evaluation              AM-PAC PT "6 Clicks" Mobility   Outcome Measure  Help needed turning from your back to your side while in a flat bed without using bedrails?: A Little Help needed moving from lying on your back to sitting on the side of a flat bed without using bedrails?: A Little Help needed moving to and from a bed to a chair (including a wheelchair)?: A Little Help needed standing up from a chair using your arms (e.g., wheelchair or bedside chair)?: A Little Help needed to walk in hospital room?: A Little Help needed climbing 3-5 steps with a railing? : A Lot 6 Click Score: 17    End of Session Equipment Utilized During Treatment: Gait belt Activity Tolerance: Patient limited by fatigue Patient left: in chair;with call bell/phone within reach;with chair alarm set;with SCD's reapplied;Other (comment)(L LE and L heel elevated via pillow) Nurse Communication: Mobility status;Precautions;Weight bearing status PT Visit Diagnosis: Other abnormalities of gait and mobility (R26.89);Muscle weakness (generalized) (M62.81);History of falling (Z91.81);Difficulty in walking, not elsewhere classified (R26.2);Pain Pain - Right/Left: Left Pain - part of body: Hip     Time: 1010-1048 PT Time Calculation (min) (ACUTE ONLY): 38 min  Charges:  $Gait Training: 8-22 mins $Therapeutic Exercise: 8-22 mins $Therapeutic Activity: 8-22 mins                     Leitha Bleak, PT 10/09/18, 11:04 AM [phone]

## 2018-10-09 NOTE — Progress Notes (Signed)
   Subjective: 3 Days Post-Op Procedure(s) (LRB): TOTAL HIP ARTHROPLASTY ANTERIOR APPROACH-LEFT (Left) Patient reports pain as mild.   Patient is well, and has had no acute complaints or problems Denies any CP, SOB, ABD pain. We will continue therapy today.  Plan is to go Home after hospital stay.  Objective: Vital signs in last 24 hours: Temp:  [97.9 F (36.6 C)-99.1 F (37.3 C)] 97.9 F (36.6 C) (02/14 0012) Pulse Rate:  [55-74] 66 (02/14 0118) Resp:  [16] 16 (02/14 0012) BP: (157-167)/(63-84) 157/63 (02/14 0118) SpO2:  [97 %-99 %] 99 % (02/14 0012)  Intake/Output from previous day: 02/13 0701 - 02/14 0700 In: 720 [P.O.:720] Out: 250 [Urine:250] Intake/Output this shift: No intake/output data recorded.  Recent Labs    10/06/18 1625 10/07/18 0502 10/08/18 0430  HGB 13.1 11.4* 11.5*   Recent Labs    10/07/18 0502 10/08/18 0430  WBC 10.0 7.5  RBC 3.66* 3.73*  HCT 34.4* 35.5*  PLT 226 230   Recent Labs    10/07/18 0502 10/08/18 0430  NA 131* 136  K 4.1 4.8  CL 99 105  CO2 28 30  BUN 18 20  CREATININE 0.74 0.73  GLUCOSE 124* 115*  CALCIUM 8.5* 8.9   No results for input(s): LABPT, INR in the last 72 hours.  EXAM General - Patient is Alert, Appropriate and Oriented Extremity - Neurovascular intact Sensation intact distally Intact pulses distally Dorsiflexion/Plantar flexion intact No cellulitis present Compartment soft Dressing - dressing C/D/I and no drainage, prevena intact with out drainage Motor Function - intact, moving foot and toes well on exam.   Past Medical History:  Diagnosis Date  . Arthritis     Assessment/Plan:   3 Days Post-Op Procedure(s) (LRB): TOTAL HIP ARTHROPLASTY ANTERIOR APPROACH-LEFT (Left) Active Problems:   Status post total hip replacement, left  Estimated body mass index is 45.71 kg/m as calculated from the following:   Height as of this encounter: 5' 2.5" (1.588 m).   Weight as of this encounter: 115.2  kg. Advance diet Up with therapy  Needs BM VSS CM to assist with discharge to SNF today pending BM  DVT Prophylaxis - Lovenox, TED hose and SCDs Partial weight bearing left lower extremity   T. Rachelle Hora, PA-C [place] 10/09/2018, 7:27 AM

## 2018-10-09 NOTE — Progress Notes (Addendum)
Patient is medically stable for D/C to [facility] today. Per Tammy admissions coordinator at [facility] authorization has been received and patient can come today to room 704. RN will call report and arrange EMS for transport. Clinical Education officer, museum (CSW) sent D/C orders to [facility] via [facility]. Patient is aware of above. CSW left patient's daughter [name] a Advertising account executive. Please reconsult if future social work needs arise. CSW signing off.  Patient's daughter [name] called CSW back and is aware of above.    [employer], LCSW [phone]

## 2018-10-26 ENCOUNTER — Other Ambulatory Visit: Payer: Self-pay

## 2018-10-26 ENCOUNTER — Inpatient Hospital Stay
Admission: EM | Admit: 2018-10-26 | Discharge: 2018-11-05 | DRG: 872 | Disposition: A | Discharge status: Skilled Nursing Facility | Payer: Medicare Other | Attending: Internal Medicine | Admitting: Internal Medicine

## 2018-10-26 ENCOUNTER — Emergency Department: Payer: Medicare Other

## 2018-10-26 ENCOUNTER — Encounter: Payer: Self-pay | Admitting: Emergency Medicine

## 2018-10-26 DIAGNOSIS — R739 Hyperglycemia, unspecified: Secondary | ICD-10-CM | POA: Diagnosis present

## 2018-10-26 DIAGNOSIS — R197 Diarrhea, unspecified: Secondary | ICD-10-CM | POA: Diagnosis not present

## 2018-10-26 DIAGNOSIS — Z79899 Other long term (current) drug therapy: Secondary | ICD-10-CM

## 2018-10-26 DIAGNOSIS — I10 Essential (primary) hypertension: Secondary | ICD-10-CM | POA: Diagnosis present

## 2018-10-26 DIAGNOSIS — A0471 Enterocolitis due to Clostridium difficile, recurrent: Secondary | ICD-10-CM | POA: Diagnosis present

## 2018-10-26 DIAGNOSIS — R140 Abdominal distension (gaseous): Secondary | ICD-10-CM

## 2018-10-26 DIAGNOSIS — Z79891 Long term (current) use of opiate analgesic: Secondary | ICD-10-CM

## 2018-10-26 DIAGNOSIS — N390 Urinary tract infection, site not specified: Secondary | ICD-10-CM | POA: Diagnosis present

## 2018-10-26 DIAGNOSIS — Z7951 Long term (current) use of inhaled steroids: Secondary | ICD-10-CM

## 2018-10-26 DIAGNOSIS — E8809 Other disorders of plasma-protein metabolism, not elsewhere classified: Secondary | ICD-10-CM | POA: Diagnosis present

## 2018-10-26 DIAGNOSIS — Z880 Allergy status to penicillin: Secondary | ICD-10-CM

## 2018-10-26 DIAGNOSIS — K529 Noninfective gastroenteritis and colitis, unspecified: Secondary | ICD-10-CM | POA: Diagnosis present

## 2018-10-26 DIAGNOSIS — Z96642 Presence of left artificial hip joint: Secondary | ICD-10-CM | POA: Diagnosis present

## 2018-10-26 DIAGNOSIS — I739 Peripheral vascular disease, unspecified: Secondary | ICD-10-CM | POA: Diagnosis present

## 2018-10-26 DIAGNOSIS — E860 Dehydration: Secondary | ICD-10-CM | POA: Diagnosis present

## 2018-10-26 DIAGNOSIS — E876 Hypokalemia: Secondary | ICD-10-CM | POA: Diagnosis present

## 2018-10-26 DIAGNOSIS — K567 Ileus, unspecified: Secondary | ICD-10-CM | POA: Diagnosis present

## 2018-10-26 DIAGNOSIS — Z6841 Body Mass Index (BMI) 40.0 and over, adult: Secondary | ICD-10-CM

## 2018-10-26 DIAGNOSIS — M1990 Unspecified osteoarthritis, unspecified site: Secondary | ICD-10-CM | POA: Diagnosis present

## 2018-10-26 DIAGNOSIS — A419 Sepsis, unspecified organism: Principal | ICD-10-CM | POA: Diagnosis present

## 2018-10-26 DIAGNOSIS — R32 Unspecified urinary incontinence: Secondary | ICD-10-CM | POA: Diagnosis present

## 2018-10-26 DIAGNOSIS — E878 Other disorders of electrolyte and fluid balance, not elsewhere classified: Secondary | ICD-10-CM | POA: Diagnosis present

## 2018-10-26 DIAGNOSIS — E778 Other disorders of glycoprotein metabolism: Secondary | ICD-10-CM | POA: Diagnosis present

## 2018-10-26 DIAGNOSIS — K921 Melena: Secondary | ICD-10-CM | POA: Diagnosis present

## 2018-10-26 DIAGNOSIS — E871 Hypo-osmolality and hyponatremia: Secondary | ICD-10-CM | POA: Diagnosis present

## 2018-10-26 DIAGNOSIS — N179 Acute kidney failure, unspecified: Secondary | ICD-10-CM | POA: Diagnosis present

## 2018-10-26 LAB — CBC WITH DIFFERENTIAL/PLATELET
Abs Immature Granulocytes: 2.18 10*3/uL — ABNORMAL HIGH (ref 0.00–0.07)
Basophils Absolute: 0 10*3/uL (ref 0.0–0.1)
Basophils Relative: 0 %
Eosinophils Absolute: 0 10*3/uL (ref 0.0–0.5)
Eosinophils Relative: 0 %
HCT: 42.7 % (ref 36.0–46.0)
Hemoglobin: 14.4 g/dL (ref 12.0–15.0)
IMMATURE GRANULOCYTES: 5 %
Lymphocytes Relative: 5 %
Lymphs Abs: 2.2 10*3/uL (ref 0.7–4.0)
MCH: 30 pg (ref 26.0–34.0)
MCHC: 33.7 g/dL (ref 30.0–36.0)
MCV: 89 fL (ref 80.0–100.0)
MONO ABS: 3.5 10*3/uL — AB (ref 0.1–1.0)
Monocytes Relative: 8 %
NEUTROS ABS: 35.7 10*3/uL — AB (ref 1.7–7.7)
Neutrophils Relative %: 82 %
Platelets: 616 10*3/uL — ABNORMAL HIGH (ref 150–400)
RBC: 4.8 MIL/uL (ref 3.87–5.11)
RDW: 12.7 % (ref 11.5–15.5)
WBC: 43.6 10*3/uL — ABNORMAL HIGH (ref 4.0–10.5)
nRBC: 0.1 % (ref 0.0–0.2)

## 2018-10-26 LAB — COMPREHENSIVE METABOLIC PANEL
ALT: 28 U/L (ref 0–44)
AST: 34 U/L (ref 15–41)
Albumin: 2.2 g/dL — ABNORMAL LOW (ref 3.5–5.0)
Alkaline Phosphatase: 161 U/L — ABNORMAL HIGH (ref 38–126)
Anion gap: 14 (ref 5–15)
BUN: 64 mg/dL — AB (ref 8–23)
CHLORIDE: 97 mmol/L — AB (ref 98–111)
CO2: 21 mmol/L — ABNORMAL LOW (ref 22–32)
Calcium: 7.7 mg/dL — ABNORMAL LOW (ref 8.9–10.3)
Creatinine, Ser: 2.16 mg/dL — ABNORMAL HIGH (ref 0.44–1.00)
GFR calc Af Amer: 26 mL/min — ABNORMAL LOW (ref >60)
GFR calc non Af Amer: 23 mL/min — ABNORMAL LOW (ref >60)
Glucose, Bld: 154 mg/dL — ABNORMAL HIGH (ref 70–99)
Potassium: 4.4 mmol/L (ref 3.5–5.1)
Sodium: 132 mmol/L — ABNORMAL LOW (ref 135–145)
Total Bilirubin: 1 mg/dL (ref 0.3–1.2)
Total Protein: 5.7 g/dL — ABNORMAL LOW (ref 6.5–8.1)

## 2018-10-26 LAB — LIPASE, BLOOD: Lipase: 18 U/L (ref 11–51)

## 2018-10-26 LAB — LACTIC ACID, PLASMA: Lactic Acid, Venous: 1.5 mmol/L (ref 0.5–1.9)

## 2018-10-26 MED ORDER — MORPHINE SULFATE (PF) 4 MG/ML IV SOLN
4.0000 mg | Freq: Once | INTRAVENOUS | Status: AC
  Administered 2018-10-26: 4 mg via INTRAVENOUS
  Filled 2018-10-26: qty 1

## 2018-10-26 MED ORDER — METRONIDAZOLE IN NACL 5-0.79 MG/ML-% IV SOLN
500.0000 mg | Freq: Once | INTRAVENOUS | Status: AC
  Administered 2018-10-26: 500 mg via INTRAVENOUS
  Filled 2018-10-26: qty 100

## 2018-10-26 MED ORDER — SODIUM CHLORIDE 0.9 % IV SOLN
Freq: Once | INTRAVENOUS | Status: AC
  Administered 2018-10-26: 21:00:00 via INTRAVENOUS

## 2018-10-26 MED ORDER — SODIUM CHLORIDE 0.9 % IV SOLN: 1.0000 g | Freq: Once | INTRAVENOUS | Status: DC

## 2018-10-26 MED ORDER — CALCIUM GLUCONATE-NACL 1-0.675 GM/50ML-% IV SOLN
1.0000 g | Freq: Once | INTRAVENOUS | Status: AC
  Administered 2018-10-26: 1000 mg via INTRAVENOUS
  Filled 2018-10-26: qty 50

## 2018-10-26 MED ORDER — VANCOMYCIN 50 MG/ML ORAL SOLUTION
125.0000 mg | Freq: Four times a day (QID) | ORAL | Status: DC
  Administered 2018-10-27 (×3): 125 mg via ORAL
  Filled 2018-10-26 (×4): qty 2.5

## 2018-10-26 MED ORDER — SODIUM CHLORIDE 0.9 % IV SOLN
Freq: Once | INTRAVENOUS | Status: AC
  Administered 2018-10-26: 20:00:00 via INTRAVENOUS

## 2018-10-26 MED ORDER — ONDANSETRON HCL 4 MG/2ML IJ SOLN
4.0000 mg | Freq: Once | INTRAMUSCULAR | Status: AC
  Administered 2018-10-26: 4 mg via INTRAVENOUS
  Filled 2018-10-26: qty 2

## 2018-10-26 NOTE — ED Provider Notes (Signed)
[facility] Emergency Department Provider Note       Time seen: ----------------------------------------- 6:51 PM on 10/26/2018 -----------------------------------------   I have reviewed the triage vital signs and the nursing notes.  HISTORY   Chief Complaint No chief complaint on file.    HPI Olivia Rodriguez is a 68 y.o. female with a history of arthritis who presents to the ED for diarrhea.  Patient is in a rehab facility status post left hip replacement.  She reports she had upper respiratory symptoms starting about 5 days ago and was given antibiotic for same.  Subsequently she has developed diarrhea for the past several days.  She has had at least 5 episodes today with more yesterday.  She has taken Imodium for same without any improvement.  She complains of diffuse abdominal distention.  Outpatient lab work revealed significant leukocytosis  Past Medical History:  Diagnosis Date  . Arthritis     Patient Active Problem List   Diagnosis Date Noted  . Status post total hip replacement, left 10/06/2018    Past Surgical History:  Procedure Laterality Date  . CESAREAN SECTION    . REFRACTIVE SURGERY Bilateral 1999  . TONSILLECTOMY    . TOTAL HIP ARTHROPLASTY Left 10/06/2018   Procedure: TOTAL HIP ARTHROPLASTY ANTERIOR APPROACH-LEFT;  Surgeon: Hessie Knows, MD;  Location: [facility] ORS;  Service: Orthopedics;  Laterality: Left;    Allergies Penicillins  Social History Social History   Tobacco Use  . Smoking status: Never Smoker  . Smokeless tobacco: Never Used  Substance Use Topics  . Alcohol use: Not on file    Comment: Occasional wine or mixed drink  . Drug use: Never   Review of Systems Constitutional: Negative for fever. Cardiovascular: Negative for chest pain. Respiratory: Negative for shortness of breath. Gastrointestinal: Positive for abdominal pain, diarrhea Musculoskeletal: Negative for back pain. Skin: Negative for  rash. Neurological: Negative for headaches, focal weakness or numbness.  All systems negative/normal/unremarkable except as stated in the HPI  ____________________________________________   PHYSICAL EXAM:  VITAL SIGNS: ED Triage Vitals  Enc Vitals Group     BP      Pulse      Resp      Temp      Temp src      SpO2      Weight      Height      Head Circumference      [facility] Flow      Pain Score      Pain Loc      Pain Edu?      Excl. in [place]?    Constitutional: Alert and oriented.  Mild distress Eyes: Conjunctivae are normal. Normal extraocular movements. ENT      Head: Normocephalic and atraumatic.      Nose: No congestion/rhinnorhea.      Mouth/Throat: Mucous membranes are moist.      Neck: No stridor. Cardiovascular: Normal rate, regular rhythm. No murmurs, rubs, or gallops. Respiratory: Normal respiratory effort without tachypnea nor retractions. Breath sounds are clear and equal bilaterally. No wheezes/rales/rhonchi. Gastrointestinal: Distended, hypoactive bowel sounds.  Diffuse tenderness Musculoskeletal: Nontender with normal range of motion in extremities. No lower extremity tenderness nor edema. Neurologic:  Normal speech and language. No gross focal neurologic deficits are appreciated.  Skin:  Skin is warm, dry and intact. No rash noted. Psychiatric: Mood and affect are normal. Speech and behavior are normal.  ____________________________________________  EKG: Interpreted by me.  Sinus rhythm the rate of  97 bpm, normal axis, normal QT  ____________________________________________  ED COURSE:  As part of my medical decision making, I reviewed the following data within the Echo History obtained from family if available, nursing notes, old chart and ekg, as well as notes from prior ED visits. Patient presented for diarrhea, we will assess with labs and imaging as indicated at this time. Clinical Course as of Oct 26 2226  Mon Oct 26, 2018   2141 Labs indicate acute renal failure likely from dehydration  Creatinine(!): 2.16 [JW]    Clinical Course User Index [JW] Earleen Newport, MD   Procedures ____________________________________________   LABS (pertinent positives/negatives)  Labs Reviewed  CBC WITH DIFFERENTIAL/PLATELET - Abnormal; Notable for the following components:      Result Value   WBC 43.6 (*)    Platelets 616 (*)    Neutro Abs 35.7 (*)    Monocytes Absolute 3.5 (*)    Abs Immature Granulocytes 2.18 (*)    All other components within normal limits  COMPREHENSIVE METABOLIC PANEL - Abnormal; Notable for the following components:   Sodium 132 (*)    Chloride 97 (*)    CO2 21 (*)    Glucose, Bld 154 (*)    BUN 64 (*)    Creatinine, Ser 2.16 (*)    Calcium 7.7 (*)    Total Protein 5.7 (*)    Albumin 2.2 (*)    Alkaline Phosphatase 161 (*)    GFR calc non Af Amer 23 (*)    GFR calc Af Amer 26 (*)    All other components within normal limits  C DIFFICILE QUICK SCREEN W PCR REFLEX  GASTROINTESTINAL PANEL BY PCR, STOOL (REPLACES STOOL CULTURE)  CULTURE, BLOOD (ROUTINE X 2)  CULTURE, BLOOD (ROUTINE X 2)  LIPASE, BLOOD  URINALYSIS, COMPLETE (UACMP) WITH MICROSCOPIC  LACTIC ACID, PLASMA   CRITICAL CARE Performed by: Laurence Aly   Total critical care time: 30 minutes  Critical care time was exclusive of separately billable procedures and treating other patients.  Critical care was necessary to treat or prevent imminent or life-threatening deterioration.  Critical care was time spent personally by me on the following activities: development of treatment plan with patient and/or surrogate as well as nursing, discussions with consultants, evaluation of patient's response to treatment, examination of patient, obtaining history from patient or surrogate, ordering and performing treatments and interventions, ordering and review of laboratory studies, ordering and review of radiographic  studies, pulse oximetry and re-evaluation of patient's condition.  RADIOLOGY Images were viewed by me  CT the abdomen pelvis with contrast IMPRESSION: 1. Long segment colitis involving essentially the entire colon, likely infectious pancolitis. 2. No free intraperitoneal air or abscess formation. 3. Trace left pleural effusion with associated atelectasis. 4. Aortic atherosclerosis (ICD10-I70.0). ____________________________________________   DIFFERENTIAL DIAGNOSIS   Diverticulitis, colitis, C. difficile, dehydration, electrolyte abnormality  FINAL ASSESSMENT AND PLAN  Presumed C. difficile colitis   Plan: The patient had presented for diarrhea. Patient's labs did reveal significant leukocytosis and acute renal failure concerning for C. difficile colitis and renal failure secondary to dehydration related to the diarrhea. Patient's imaging revealed a pancolitis without any perforation or abscess.  She was started on oral vancomycin and IV Flagyl.  Have discussed with the hospitalist for admission.  She received 2 L of fluid as well as pain medicine and antiemetics.   Laurence Aly, MD    Note: This note was generated in part or whole with  voice recognition software. Voice recognition is usually quite accurate but there are transcription errors that can and very often do occur. I apologize for any typographical errors that were not detected and corrected.     Earleen Newport, MD 10/26/18 2229

## 2018-10-26 NOTE — ED Triage Notes (Signed)
Pt to ED via EMS from [facility] c/o elevated WBC of 45 and diarrhea for several days.  Presents A&Ox4, skin WNL, had left hip surgery recently and has been on ABX.

## 2018-10-26 NOTE — ED Notes (Signed)
Lab states not able to run other labs until get a lavender top tube.

## 2018-10-27 ENCOUNTER — Other Ambulatory Visit: Payer: Self-pay

## 2018-10-27 DIAGNOSIS — E860 Dehydration: Secondary | ICD-10-CM | POA: Diagnosis present

## 2018-10-27 DIAGNOSIS — R739 Hyperglycemia, unspecified: Secondary | ICD-10-CM | POA: Diagnosis present

## 2018-10-27 DIAGNOSIS — K529 Noninfective gastroenteritis and colitis, unspecified: Secondary | ICD-10-CM | POA: Diagnosis not present

## 2018-10-27 DIAGNOSIS — R230 Cyanosis: Secondary | ICD-10-CM | POA: Diagnosis not present

## 2018-10-27 DIAGNOSIS — E871 Hypo-osmolality and hyponatremia: Secondary | ICD-10-CM | POA: Diagnosis present

## 2018-10-27 DIAGNOSIS — N179 Acute kidney failure, unspecified: Secondary | ICD-10-CM | POA: Diagnosis present

## 2018-10-27 DIAGNOSIS — A0472 Enterocolitis due to Clostridium difficile, not specified as recurrent: Secondary | ICD-10-CM | POA: Diagnosis not present

## 2018-10-27 DIAGNOSIS — Z96642 Presence of left artificial hip joint: Secondary | ICD-10-CM | POA: Diagnosis not present

## 2018-10-27 DIAGNOSIS — M1990 Unspecified osteoarthritis, unspecified site: Secondary | ICD-10-CM | POA: Diagnosis present

## 2018-10-27 DIAGNOSIS — E8809 Other disorders of plasma-protein metabolism, not elsewhere classified: Secondary | ICD-10-CM | POA: Diagnosis present

## 2018-10-27 DIAGNOSIS — R32 Unspecified urinary incontinence: Secondary | ICD-10-CM | POA: Diagnosis present

## 2018-10-27 DIAGNOSIS — K567 Ileus, unspecified: Secondary | ICD-10-CM | POA: Diagnosis present

## 2018-10-27 DIAGNOSIS — Z6841 Body Mass Index (BMI) 40.0 and over, adult: Secondary | ICD-10-CM | POA: Diagnosis not present

## 2018-10-27 DIAGNOSIS — E876 Hypokalemia: Secondary | ICD-10-CM | POA: Diagnosis present

## 2018-10-27 DIAGNOSIS — N390 Urinary tract infection, site not specified: Secondary | ICD-10-CM | POA: Diagnosis present

## 2018-10-27 DIAGNOSIS — A0471 Enterocolitis due to Clostridium difficile, recurrent: Secondary | ICD-10-CM | POA: Diagnosis present

## 2018-10-27 DIAGNOSIS — Z880 Allergy status to penicillin: Secondary | ICD-10-CM | POA: Diagnosis not present

## 2018-10-27 DIAGNOSIS — R197 Diarrhea, unspecified: Secondary | ICD-10-CM | POA: Diagnosis present

## 2018-10-27 DIAGNOSIS — A419 Sepsis, unspecified organism: Secondary | ICD-10-CM | POA: Diagnosis present

## 2018-10-27 DIAGNOSIS — Z79899 Other long term (current) drug therapy: Secondary | ICD-10-CM | POA: Diagnosis not present

## 2018-10-27 DIAGNOSIS — K921 Melena: Secondary | ICD-10-CM | POA: Diagnosis present

## 2018-10-27 DIAGNOSIS — I10 Essential (primary) hypertension: Secondary | ICD-10-CM | POA: Diagnosis present

## 2018-10-27 DIAGNOSIS — E878 Other disorders of electrolyte and fluid balance, not elsewhere classified: Secondary | ICD-10-CM | POA: Diagnosis present

## 2018-10-27 DIAGNOSIS — E778 Other disorders of glycoprotein metabolism: Secondary | ICD-10-CM | POA: Diagnosis present

## 2018-10-27 DIAGNOSIS — I739 Peripheral vascular disease, unspecified: Secondary | ICD-10-CM | POA: Diagnosis present

## 2018-10-27 LAB — C DIFFICILE QUICK SCREEN W PCR REFLEX
C DIFFICILE (CDIFF) INTERP: DETECTED
C Diff antigen: POSITIVE — AB
C Diff toxin: POSITIVE — AB

## 2018-10-27 LAB — COMPREHENSIVE METABOLIC PANEL
ALT: 24 U/L (ref 0–44)
AST: 17 U/L (ref 15–41)
Albumin: 2 g/dL — ABNORMAL LOW (ref 3.5–5.0)
Alkaline Phosphatase: 124 U/L (ref 38–126)
Anion gap: 11 (ref 5–15)
BUN: 65 mg/dL — ABNORMAL HIGH (ref 8–23)
CHLORIDE: 102 mmol/L (ref 98–111)
CO2: 20 mmol/L — ABNORMAL LOW (ref 22–32)
Calcium: 7.9 mg/dL — ABNORMAL LOW (ref 8.9–10.3)
Creatinine, Ser: 1.76 mg/dL — ABNORMAL HIGH (ref 0.44–1.00)
GFR calc Af Amer: 34 mL/min — ABNORMAL LOW (ref >60)
GFR calc non Af Amer: 29 mL/min — ABNORMAL LOW (ref >60)
Glucose, Bld: 151 mg/dL — ABNORMAL HIGH (ref 70–99)
Potassium: 3.8 mmol/L (ref 3.5–5.1)
Sodium: 133 mmol/L — ABNORMAL LOW (ref 135–145)
Total Bilirubin: 0.4 mg/dL (ref 0.3–1.2)
Total Protein: 5.2 g/dL — ABNORMAL LOW (ref 6.5–8.1)

## 2018-10-27 LAB — CBC
HEMATOCRIT: 45.4 % (ref 36.0–46.0)
HEMOGLOBIN: 14.8 g/dL (ref 12.0–15.0)
MCH: 29.5 pg (ref 26.0–34.0)
MCHC: 32.6 g/dL (ref 30.0–36.0)
MCV: 90.4 fL (ref 80.0–100.0)
Platelets: 631 10*3/uL — ABNORMAL HIGH (ref 150–400)
RBC: 5.02 MIL/uL (ref 3.87–5.11)
RDW: 12.8 % (ref 11.5–15.5)
WBC: 43.4 10*3/uL — ABNORMAL HIGH (ref 4.0–10.5)
nRBC: 0 % (ref 0.0–0.2)

## 2018-10-27 LAB — GLUCOSE, CAPILLARY: Glucose-Capillary: 146 mg/dL — ABNORMAL HIGH (ref 70–99)

## 2018-10-27 LAB — URINALYSIS, COMPLETE (UACMP) WITH MICROSCOPIC
Bilirubin Urine: NEGATIVE
Glucose, UA: NEGATIVE mg/dL
Ketones, ur: NEGATIVE mg/dL
NITRITE: NEGATIVE
Protein, ur: 100 mg/dL — AB
SPECIFIC GRAVITY, URINE: 1.019 (ref 1.005–1.030)
WBC, UA: >50 WBC/hpf — ABNORMAL HIGH (ref 0–5)
pH: 5 (ref 5.0–8.0)

## 2018-10-27 LAB — PROTIME-INR
INR: 1.2 (ref 0.8–1.2)
Prothrombin Time: 14.9 seconds (ref 11.4–15.2)

## 2018-10-27 LAB — APTT: aPTT: 27 seconds (ref 24–36)

## 2018-10-27 MED ORDER — TRAMADOL HCL 50 MG PO TABS
50.0000 mg | ORAL_TABLET | Freq: Four times a day (QID) | ORAL | Status: DC | PRN
  Administered 2018-10-28: 50 mg via ORAL
  Filled 2018-10-27: qty 1

## 2018-10-27 MED ORDER — ONDANSETRON HCL 4 MG PO TABS
4.0000 mg | ORAL_TABLET | Freq: Four times a day (QID) | ORAL | Status: DC | PRN
  Filled 2018-10-27: qty 1

## 2018-10-27 MED ORDER — SODIUM CHLORIDE 0.9% FLUSH
3.0000 mL | Freq: Two times a day (BID) | INTRAVENOUS | Status: DC
  Administered 2018-10-27 – 2018-11-05 (×18): 3 mL via INTRAVENOUS

## 2018-10-27 MED ORDER — HEPARIN SODIUM (PORCINE) 5000 UNIT/ML IJ SOLN
5000.0000 [IU] | Freq: Three times a day (TID) | INTRAMUSCULAR | Status: DC
  Administered 2018-10-27 – 2018-11-05 (×28): 5000 [IU] via SUBCUTANEOUS
  Filled 2018-10-27 (×28): qty 1

## 2018-10-27 MED ORDER — VANCOMYCIN 50 MG/ML ORAL SOLUTION
125.0000 mg | Freq: Four times a day (QID) | ORAL | Status: DC
  Administered 2018-10-27 – 2018-10-30 (×11): 125 mg via ORAL
  Filled 2018-10-27 (×17): qty 2.5

## 2018-10-27 MED ORDER — SODIUM CHLORIDE 0.9 % IV SOLN
INTRAVENOUS | Status: DC
  Administered 2018-10-27 – 2018-10-28 (×3): via INTRAVENOUS

## 2018-10-27 MED ORDER — ONDANSETRON HCL 4 MG/2ML IJ SOLN
4.0000 mg | Freq: Four times a day (QID) | INTRAMUSCULAR | Status: DC | PRN
  Administered 2018-10-27 – 2018-11-04 (×9): 4 mg via INTRAVENOUS
  Filled 2018-10-27 (×9): qty 2

## 2018-10-27 MED ORDER — METRONIDAZOLE IN NACL 5-0.79 MG/ML-% IV SOLN
500.0000 mg | Freq: Three times a day (TID) | INTRAVENOUS | Status: DC
  Administered 2018-10-27: 500 mg via INTRAVENOUS
  Filled 2018-10-27: qty 100

## 2018-10-27 MED ORDER — SODIUM CHLORIDE 0.9 % IV SOLN
1.0000 g | INTRAVENOUS | Status: DC
  Administered 2018-10-27 – 2018-10-28 (×2): 1 g via INTRAVENOUS
  Filled 2018-10-27: qty 1
  Filled 2018-10-27 (×2): qty 10

## 2018-10-27 MED ORDER — METHOCARBAMOL 500 MG PO TABS
500.0000 mg | ORAL_TABLET | Freq: Four times a day (QID) | ORAL | Status: DC | PRN
  Filled 2018-10-27: qty 1

## 2018-10-27 MED ORDER — ACETAMINOPHEN 325 MG PO TABS: 325.0000 mg | ORAL_TABLET | Freq: Four times a day (QID) | ORAL | Status: DC | PRN

## 2018-10-27 MED ORDER — VANCOMYCIN 50 MG/ML ORAL SOLUTION: 125.0000 mg | Freq: Four times a day (QID) | ORAL | Status: DC

## 2018-10-27 NOTE — ED Notes (Signed)
NT Sarah to take pt up to floor.

## 2018-10-27 NOTE — ED Notes (Signed)
Patient brief changed. Unable to get a stool sample at this time.

## 2018-10-27 NOTE — ED Notes (Signed)
Pt repositioned in bed; given icee and apple juice; water/ice.

## 2018-10-27 NOTE — ED Notes (Signed)
Per 2[facility], orders for pt are being changed by MD and pt will no longer be going to 2A.

## 2018-10-27 NOTE — H&P (Addendum)
[facility] [place] NAME: Olivia Rodriguez    MR#:  [id number]  DATE OF BIRTH:  Jul 09, 1951  DATE OF ADMISSION:  10/26/2018  PRIMARY CARE PHYSICIAN: Patient, No Pcp Per   REQUESTING/REFERRING PHYSICIAN: Dr. Jimmye Norman  CHIEF COMPLAINT:   Chief Complaint  Patient presents with  . Diarrhea  . Abnormal labs    HISTORY OF PRESENT ILLNESS:  Olivia Rodriguez  is a 68 y.o. female with a known history listed below presented to emergency room for evaluation of diarrhea.  Patient has multiple episodes of loose and liquidy stools for past 3 days.  Patient was recently put on antibiotic for upper respiratory tract symptoms.  Patient started diarrhea after antibiotic treatment.  Patient also has subjective fever and chills.  Patient is complaining of foul-smelling stool.  Patient is also complaining of diffuse abdominal pain.  Today patient has not make any urine.  She feels retention.  Denies chest pain or shortness of breath.  No other complaints.  In emergency room patient has initial evaluation with labs and CT scan of abdomen.  Labs suggestive of elevated WBC.  CT scan showed pancolitis.  Patient has no stool in the emergency room.  C. difficile is pending.  Patient started on empiric treatment for C. difficile colitis.  Hospitalist team requested for admission.  PAST MEDICAL HISTORY:   Past Medical History:  Diagnosis Date  . Arthritis     PAST SURGICAL HISTORY:   Past Surgical History:  Procedure Laterality Date  . CESAREAN SECTION    . REFRACTIVE SURGERY Bilateral 1999  . TONSILLECTOMY    . TOTAL HIP ARTHROPLASTY Left 10/06/2018   Procedure: TOTAL HIP ARTHROPLASTY ANTERIOR APPROACH-LEFT;  Surgeon: Hessie Knows, MD;  Location: [facility] ORS;  Service: Orthopedics;  Laterality: Left;    SOCIAL HISTORY:   Social History   Tobacco Use  . Smoking status: Never Smoker  . Smokeless tobacco: Never Used  Substance Use Topics  . Alcohol use: Not Currently     Comment: Occasional wine or mixed drink    FAMILY HISTORY:  History reviewed. No pertinent family history.  DRUG ALLERGIES:   Allergies  Allergen Reactions  . Penicillins Hives and Other (See Comments)    Did it involve swelling of the face/tongue/throat, SOB, or low BP? No Did it involve sudden or severe rash/hives, skin peeling, or any reaction on the inside of your mouth or nose? Yes Did you need to seek medical attention at a hospital or doctor's office? Yes When did it last happen? Over 60 years ago If all above answers are "NO", may proceed with cephalosporin use.     REVIEW OF SYSTEMS:   ROS -12 point review of system reviewed positive as per HPI otherwise negative  MEDICATIONS AT HOME:   Prior to Admission medications   Medication Sig Start Date End Date Taking? Authorizing Provider  Amino Acids-Protein Hydrolys (FEEDING SUPPLEMENT, PRO-STAT SUGAR FREE 64,) LIQD Take 30 mLs by mouth 2 (two) times daily between meals.   Yes [provider]  benzonatate (TESSALON) 100 MG capsule Take 100 mg by mouth 3 (three) times daily as needed for cough.   Yes [provider]  fluticasone (FLONASE) 50 MCG/ACT nasal spray Place 2 sprays into both nostrils daily.   Yes [provider]  hydrochlorothiazide (HYDRODIURIL) 12.5 MG tablet Take 12.5 mg by mouth daily.   Yes [provider]  loratadine (CLARITIN) 10 MG tablet Take 10 mg by mouth at  bedtime.   Yes [provider]  mirabegron ER (MYRBETRIQ) 25 MG TB24 tablet Take 25 mg by mouth daily.   Yes [provider]  Multiple Vitamin (MULTIVITAMIN WITH MINERALS) TABS tablet Take 1 tablet by mouth daily.   Yes [provider]  saccharomyces boulardii (FLORASTOR) 250 MG capsule Take 250 mg by mouth 2 (two) times daily.   Yes [provider]  traMADol (ULTRAM) 50 MG tablet Take 50 mg by mouth 4 (four) times daily.   Yes [provider]  vitamin C (ASCORBIC  ACID) 250 MG tablet Take 250 mg by mouth 2 (two) times daily.   Yes [provider]  zinc sulfate 220 (50 Zn) MG capsule Take 220 mg by mouth daily.   Yes [provider]  acetaminophen (TYLENOL) 325 MG tablet Take 1-2 tablets (325-650 mg total) by mouth every 6 (six) hours as needed for mild pain (pain score 1-3 or temp > 100.5). 10/08/18   Duanne Guess, PA-C  docusate sodium (COLACE) 100 MG capsule Take 1 capsule (100 mg total) by mouth 2 (two) times daily. 10/08/18   Duanne Guess, PA-C  enoxaparin (LOVENOX) 40 MG/0.4ML injection Inject 0.4 mLs (40 mg total) into the skin daily for 14 days. 10/08/18 10/22/18  Duanne Guess, PA-C  methocarbamol (ROBAXIN) 500 MG tablet Take 1 tablet (500 mg total) by mouth every 6 (six) hours as needed for muscle spasms. 10/08/18   Duanne Guess, PA-C  oxyCODONE 10 MG TABS Take 1 tablet (10 mg total) by mouth every 3 (three) hours as needed for moderate pain. Patient not taking: Reported on 10/26/2018 10/08/18   Duanne Guess, PA-C      VITAL SIGNS:  Blood pressure 137/67, pulse 96, temperature 98.4 F (36.9 C), temperature source Oral, resp. rate (!) 24, height 5\' 2"  (1.575 m), weight 115.7 kg, SpO2 93 %.  PHYSICAL EXAMINATION:  Physical Exam  GENERAL:  68 y.o.-year-old patient lying in the bed with no acute distress.  EYES: Pupils equal, round, reactive to light and accommodation. No scleral icterus. Extraocular muscles intact.  HEENT: Head atraumatic, normocephalic. Oropharynx and nasopharynx clear.  NECK:  Supple, no jugular venous distention. No thyroid enlargement, no tenderness.  LUNGS: Normal breath sounds bilaterally, no wheezing, rales,rhonchi or crepitation. No use of accessory muscles of respiration.  CARDIOVASCULAR: S1, S2 normal. No murmurs, rubs, or gallops.  ABDOMEN: Soft, mild diffuse tenderness, distention. Bowel sounds present. No organomegaly or mass.  EXTREMITIES: No pedal edema, cyanosis, or clubbing.   NEUROLOGIC: Cranial nerves II through XII are intact. Muscle strength 5/5 in all extremities. Sensation intact. Gait not checked.  PSYCHIATRIC: The patient is alert and oriented x 3.  SKIN: No obvious rash, lesion, or ulcer.   LABORATORY PANEL:   CBC Recent Labs  Lab 10/26/18 1943  WBC 43.6*  HGB 14.4  HCT 42.7  PLT 616*   ------------------------------------------------------------------------------------------------------------------  Chemistries  Recent Labs  Lab 10/26/18 1943  NA 132*  K 4.4  CL 97*  CO2 21*  GLUCOSE 154*  BUN 64*  CREATININE 2.16*  CALCIUM 7.7*  AST 34  ALT 28  ALKPHOS 161*  BILITOT 1.0   ------------------------------------------------------------------------------------------------------------------  Cardiac Enzymes No results for input(s): TROPONINI in the last 168 hours. ------------------------------------------------------------------------------------------------------------------  RADIOLOGY:  Ct Abdomen Pelvis Wo Contrast  Result Date: 10/26/2018 CLINICAL DATA:  Diarrhea and leukocytosis. EXAM: CT ABDOMEN AND PELVIS WITHOUT CONTRAST TECHNIQUE: Multidetector CT imaging of the abdomen and pelvis was performed following the standard  protocol without IV contrast. COMPARISON:  None. FINDINGS: LOWER CHEST: Trace left pleural effusion with atelectasis. HEPATOBILIARY: The hepatic contours and density are normal. There is no intra- or extrahepatic biliary dilatation. The gallbladder is normal. PANCREAS: The pancreatic parenchymal contours are normal and there is no ductal dilatation. There is no peripancreatic fluid collection. SPLEEN: Normal. ADRENALS/URINARY TRACT: --Adrenal glands: Normal. --Right kidney/ureter: No hydronephrosis, nephroureterolithiasis, perinephric stranding or solid renal mass. --Left kidney/ureter: No hydronephrosis, nephroureterolithiasis, perinephric stranding or solid renal mass. --Urinary bladder: Normal for degree of  distention STOMACH/BOWEL: --Stomach/Duodenum: There is no hiatal hernia or other gastric abnormality. The duodenal course and caliber are normal. --Small bowel: No dilatation or inflammation. --Colon: There is long segment colonic wall thickening with surrounding inflammatory stranding and adjacent free fluid, involving essentially the entire colon. No free intraperitoneal air. No walled-off collection. --Appendix: Normal. VASCULAR/LYMPHATIC: There is aortic atherosclerosis without hemodynamically significant stenosis. No abdominal or pelvic lymphadenopathy. REPRODUCTIVE: Normal uterus. No adnexal mass. MUSCULOSKELETAL. Left total hip arthroplasty. Multilevel facet hypertrophy. No acute abnormality. OTHER: None. IMPRESSION: 1. Long segment colitis involving essentially the entire colon, likely infectious pancolitis. 2. No free intraperitoneal air or abscess formation. 3. Trace left pleural effusion with associated atelectasis. 4.  Aortic atherosclerosis (ICD10-I70.0). Electronically Signed   By: Ulyses Jarred M.D.   On: 10/26/2018 22:19      IMPRESSION AND PLAN:   1.  Colitis: Likely C. Difficile.  Recent antibiotic use.  Elevated WBC.  Check stool for C. difficile.  Empiric treatment with IV Flagyl and oral vancomycin.  Symptomatic treatment.  Keep patient n.p.o.  GI consult requested.  Follow-up recommendation.  Monitor WBC.   2.  AKI : Likely prerenal from diarrhea and dehydration.  IV hydration.  CT scan without any hydronephrosis.  Hold nephrotoxic agents.  Renal dose medications.  3.  Urinary retention: Order Foley catheter.  Monitor urinary output.  4.  Chronic other medical problems: Monitor.  Continue home medication as ordered  DVT prophylaxis: Heparin subcutaneous  Moderate to high risk secondary to above  Estimated length of stay more than 2 midnights  Further treatment based on clinical course and specialist evaluation  All the records are reviewed and case discussed with ED  provider. Management plans discussed with the patient, family and they are in agreement.  CODE STATUS: Full  TOTAL TIME TAKING CARE OF THIS PATIENT: 45 minutes.    Sedalia Muta M.D on 10/27/2018 at 12:03 AM  Between 7am to 6pm - Pager - [phone]  After 6pm go to [email or web address] - Proofreader  [facility]  Office  [phone]  CC: Primary care physician; Patient, No Pcp Per   Addendum: UA suggestive of UTI. Pt started on IV rocephin. F/u urine culture.

## 2018-10-27 NOTE — ED Notes (Signed)
Patient in bed resting. No complaints at this time.

## 2018-10-27 NOTE — ED Notes (Addendum)
Pt states dec nausea. Offered to help assist pt to change position in bed; pt refused.

## 2018-10-27 NOTE — ED Notes (Signed)
Pt states "feeling nauseous". See MAR for med administration

## 2018-10-27 NOTE — Progress Notes (Signed)
PT Cancellation Note  Patient Details Name: Olivia Rodriguez MRN: [id number] DOB: 01-21-1951   Cancelled Treatment:    Reason Eval/Treat Not Completed: Other (comment).  Pt declined PT evaluation stating she is nauseated, displeased with her SNF stay.  Follow up at another time.   Ramond Dial 10/27/2018, 4:46 PM   Mee Hives, PT MS Acute Rehab Dept. Number: [place] [facility] [place]

## 2018-10-27 NOTE — ED Notes (Signed)
Report to Yessica, RN   

## 2018-10-27 NOTE — Plan of Care (Signed)
Since there are no more tele boxes available on unit, at this time, we are unable to place patient on tele (as ordered). Staff will place on tele when a box becomes available.

## 2018-10-27 NOTE — Consult Note (Signed)
Jonathon Bellows , MD [address] [place], [place], [place], [address] [address], [place], [place], [place], [address] Phone: [phone]  Fax: [phone]  Consultation  Referring Provider:    ER Primary Care Physician:  Patient, No Pcp Per Primary Gastroenterologist:  None          Reason for Consultation:     Colitis   Date of Admission:  10/26/2018 Date of Consultation:  10/27/2018         HPI:   Makina Skow is a 68 y.o. female presented to the ER last night with abdominal pain , diarrhea last few days. She was given an antibiotic for a respiratory tract infection.   Ct abdomen performed showed pancolitis . Admission complicated with renal failure . Commenced on IV flagyl and oral vancomycin empirically for C diff. [place] 43.6 on admission.   She states that she has some abdominal discomfort but is having a lot of diarrhea.  She even said that she had some loose stools just a short while back and she was just clean.  Vomiting is probably a bit better since she came up to the hospital.  Minimal abdominal distention which has not got any worse since admission.  Still in the ER when I examined her.   Past Medical History:  Diagnosis Date  . Arthritis     Past Surgical History:  Procedure Laterality Date  . CESAREAN SECTION    . REFRACTIVE SURGERY Bilateral 1999  . TONSILLECTOMY    . TOTAL HIP ARTHROPLASTY Left 10/06/2018   Procedure: TOTAL HIP ARTHROPLASTY ANTERIOR APPROACH-LEFT;  Surgeon: Hessie Knows, MD;  Location: [facility] ORS;  Service: Orthopedics;  Laterality: Left;    Prior to Admission medications   Medication Sig Start Date End Date Taking? Authorizing Provider  Amino Acids-Protein Hydrolys (FEEDING SUPPLEMENT, PRO-STAT SUGAR FREE 64,) LIQD Take 30 mLs by mouth 2 (two) times daily between meals.   Yes [provider]  benzonatate (TESSALON) 100 MG capsule Take 100 mg by mouth 3 (three) times daily as needed for cough.   Yes [provider]   fluticasone (FLONASE) 50 MCG/ACT nasal spray Place 2 sprays into both nostrils daily.   Yes [provider]  hydrochlorothiazide (HYDRODIURIL) 12.5 MG tablet Take 12.5 mg by mouth daily.   Yes [provider]  loratadine (CLARITIN) 10 MG tablet Take 10 mg by mouth at bedtime.   Yes [provider]  mirabegron ER (MYRBETRIQ) 25 MG TB24 tablet Take 25 mg by mouth daily.   Yes [provider]  Multiple Vitamin (MULTIVITAMIN WITH MINERALS) TABS tablet Take 1 tablet by mouth daily.   Yes [provider]  saccharomyces boulardii (FLORASTOR) 250 MG capsule Take 250 mg by mouth 2 (two) times daily.   Yes [provider]  traMADol (ULTRAM) 50 MG tablet Take 50 mg by mouth 4 (four) times daily.   Yes [provider]  vitamin C (ASCORBIC ACID) 250 MG tablet Take 250 mg by mouth 2 (two) times daily.   Yes [provider]  zinc sulfate 220 (50 Zn) MG capsule Take 220 mg by mouth daily.   Yes [provider]  acetaminophen (TYLENOL) 325 MG tablet Take 1-2 tablets (325-650 mg total) by mouth every 6 (six) hours as needed for mild pain (pain score 1-3 or temp > 100.5). 10/08/18   Duanne Guess, PA-C  docusate sodium (COLACE) 100 MG capsule Take 1 capsule (100 mg total) by mouth 2 (two) times daily.  10/08/18   Duanne Guess, PA-C  enoxaparin (LOVENOX) 40 MG/0.4ML injection Inject 0.4 mLs (40 mg total) into the skin daily for 14 days. 10/08/18 10/22/18  Duanne Guess, PA-C  methocarbamol (ROBAXIN) 500 MG tablet Take 1 tablet (500 mg total) by mouth every 6 (six) hours as needed for muscle spasms. 10/08/18   Duanne Guess, PA-C  oxyCODONE 10 MG TABS Take 1 tablet (10 mg total) by mouth every 3 (three) hours as needed for moderate pain. Patient not taking: Reported on 10/26/2018 10/08/18   Duanne Guess, PA-C    History reviewed. No pertinent family history.   Social History   Tobacco Use  . Smoking status: Never Smoker  .  Smokeless tobacco: Never Used  Substance Use Topics  . Alcohol use: Not Currently    Comment: Occasional wine or mixed drink  . Drug use: Never    Allergies as of 10/26/2018 - Review Complete 10/26/2018  Allergen Reaction Noted  . Penicillins Hives and Other (See Comments) 09/17/2018    Review of Systems:    All systems reviewed and negative except where noted in HPI.   Physical Exam:  Vital signs in last 24 hours: Temp:  [98.4 F (36.9 C)] 98.4 F (36.9 C) (03/02 1857) Pulse Rate:  [85-96] 85 (03/03 0515) Resp:  [20-28] 22 (03/03 0515) BP: (110-162)/(67-99) 162/95 (03/03 0512) SpO2:  [90 %-94 %] 92 % (03/03 0515) Weight:  [115.7 kg] 115.7 kg (03/02 1859)   General:   Pleasant, cooperative in NAD Head:  Normocephalic and atraumatic. Eyes:   No icterus.   Conjunctiva pink. PERRLA. Ears:  Normal auditory acuity. Neck:  Supple; no masses or thyroidomegaly Lungs: Respirations even and unlabored. Lungs clear to auscultation bilaterally.   No wheezes, crackles, or rhonchi.  Heart:  Regular rate and rhythm;  Without murmur, clicks, rubs or gallops Abdomen:  Soft, highly distended, nontender. Normal bowel sounds. No appreciable masses or hepatomegaly.  No rebound or guarding.  Neurologic:  Alert and oriented x3;  grossly normal neurologically. Skin:  Intact without significant lesions or rashes. Cervical Nodes:  No significant cervical adenopathy. Psych:  Alert and cooperative. Normal affect.  LAB RESULTS: Recent Labs    10/26/18 1943  WBC 43.6*  HGB 14.4  HCT 42.7  PLT 616*   BMET Recent Labs    10/26/18 1943  NA 132*  K 4.4  CL 97*  CO2 21*  GLUCOSE 154*  BUN 64*  CREATININE 2.16*  CALCIUM 7.7*   LFT Recent Labs    10/26/18 1943  PROT 5.7*  ALBUMIN 2.2*  AST 34  ALT 28  ALKPHOS 161*  BILITOT 1.0   PT/INR No results for input(s): LABPROT, INR in the last 72 hours.  STUDIES: Ct Abdomen Pelvis Wo Contrast  Result Date: 10/26/2018 CLINICAL DATA:   Diarrhea and leukocytosis. EXAM: CT ABDOMEN AND PELVIS WITHOUT CONTRAST TECHNIQUE: Multidetector CT imaging of the abdomen and pelvis was performed following the standard protocol without IV contrast. COMPARISON:  None. FINDINGS: LOWER CHEST: Trace left pleural effusion with atelectasis. HEPATOBILIARY: The hepatic contours and density are normal. There is no intra- or extrahepatic biliary dilatation. The gallbladder is normal. PANCREAS: The pancreatic parenchymal contours are normal and there is no ductal dilatation. There is no peripancreatic fluid collection. SPLEEN: Normal. ADRENALS/URINARY TRACT: --Adrenal glands: Normal. --Right kidney/ureter: No hydronephrosis, nephroureterolithiasis, perinephric stranding or solid renal mass. --Left kidney/ureter: No hydronephrosis, nephroureterolithiasis, perinephric stranding or solid renal mass. --Urinary bladder: Normal for degree of distention  STOMACH/BOWEL: --Stomach/Duodenum: There is no hiatal hernia or other gastric abnormality. The duodenal course and caliber are normal. --Small bowel: No dilatation or inflammation. --Colon: There is long segment colonic wall thickening with surrounding inflammatory stranding and adjacent free fluid, involving essentially the entire colon. No free intraperitoneal air. No walled-off collection. --Appendix: Normal. VASCULAR/LYMPHATIC: There is aortic atherosclerosis without hemodynamically significant stenosis. No abdominal or pelvic lymphadenopathy. REPRODUCTIVE: Normal uterus. No adnexal mass. MUSCULOSKELETAL. Left total hip arthroplasty. Multilevel facet hypertrophy. No acute abnormality. OTHER: None. IMPRESSION: 1. Long segment colitis involving essentially the entire colon, likely infectious pancolitis. 2. No free intraperitoneal air or abscess formation. 3. Trace left pleural effusion with associated atelectasis. 4.  Aortic atherosclerosis (ICD10-I70.0). Electronically Signed   By: Ulyses Jarred M.D.   On: 10/26/2018 22:19       Impression / Plan:   Mescal Flinchbaugh is a 68 y.o. y/o female admitted with an acute onset of abdominal pain and diarrhea after a course of antibiotics for the upper respite tract infection.  CT scan of the abdomen shows pancolitis.  The admission is complicated by acute kidney injury, significant leukocytosis of 43,000.  Patient has appropriately been started on empiric  therapy for C. difficile colitis IV Flagyl and oral vancomycin..  Plan 1.  Treat AKI with IV fluids. 2.  Continue IV Flagyl and oral vancomycin.  In 24 hours if not improving then suggest to increase vancomycin to 500 mg every 8 hourly per oral. 3.  Serial abdominal exams, if there is increasing abdominal tenderness, tachycardia, fever would require urgent imaging to rule out toxic megacolon which is a complication of C. difficile colitis and appropriate surgical consultation. 4. check stool for C. difficile and GI PCR.  Thank you for involving me in the care of this patient.      LOS: 0 days   Jonathon Bellows, MD  10/27/2018, 8:36 AM

## 2018-10-27 NOTE — ED Notes (Signed)
ED TO INPATIENT HANDOFF REPORT  ED Nurse Name and Phone #: Metta Clines [phone]  S Name/Age/Gender Olivia Rodriguez 68 y.o. female Room/Bed: ED25A/ED25A  Code Status   Code Status: Full Code  Home/SNF/Other Home Patient oriented to: self, place, time and situation Is this baseline? Yes   Triage Complete: Triage complete  Chief Complaint Diarrhea  Triage Note Pt to ED via EMS from [facility] c/o elevated WBC of 45 and diarrhea for several days.  Presents A&Ox4, skin WNL, had left hip surgery recently and has been on ABX.   Allergies Allergies  Allergen Reactions  . Penicillins Hives and Other (See Comments)    Did it involve swelling of the face/tongue/throat, SOB, or low BP? No Did it involve sudden or severe rash/hives, skin peeling, or any reaction on the inside of your mouth or nose? Yes Did you need to seek medical attention at a hospital or doctor's office? Yes When did it last happen? Over 60 years ago If all above answers are "NO", may proceed with cephalosporin use.     Level of Care/Admitting Diagnosis ED Disposition    ED Disposition Condition [facility] Area: [place] [[id number]]  Level of Care: Telemetry [5]  Diagnosis: Colitis [[id number]]  Admitting Physician: Sedalia Muta [[id number]]  Attending Physician: Sedalia Muta [[id number]]  Estimated length of stay: 3 - 4 days  Certification:: I certify this patient will need inpatient services for at least 2 midnights  PT Class (Do Not Modify): Inpatient [101]  PT Acc Code (Do Not Modify): Private [1]       B Medical/Surgery History Past Medical History:  Diagnosis Date  . Arthritis    Past Surgical History:  Procedure Laterality Date  . CESAREAN SECTION    . REFRACTIVE SURGERY Bilateral 1999  . TONSILLECTOMY    . TOTAL HIP ARTHROPLASTY Left 10/06/2018   Procedure: TOTAL HIP ARTHROPLASTY ANTERIOR APPROACH-LEFT;  Surgeon: Hessie Knows, MD;  Location: [facility] ORS;   Service: Orthopedics;  Laterality: Left;     A IV Location/Drains/Wounds Patient Lines/Drains/Airways Status   Active Line/Drains/Airways    Name:   Placement date:   Placement time:   Site:   Days:   Peripheral IV 10/06/18 Left Arm   10/06/18    1105    Arm   21   Peripheral IV 10/26/18 Left Arm   10/26/18    1954    Arm   1   Negative Pressure Wound Therapy Hip Left   10/06/18    1359    -   21   Urethral Catheter Brittney Sparks, NTII Non-latex 14 Fr.   10/26/18    2352    Non-latex   1   External Urinary Catheter   10/08/18    1955    -   19   Airway   10/06/18    1226     21   Airway   10/06/18    1226     21   Incision (Closed) 10/06/18 Hip Left   10/06/18    1359     21          Intake/Output Last 24 hours  Intake/Output Summary (Last 24 hours) at 10/27/2018 1715 Last data filed at 10/27/2018 0056 Gross per 24 hour  Intake 97.78 ml  Output -  Net 97.78 ml    Labs/Imaging Results for orders placed or performed during the hospital encounter of 10/26/18 (from the past 48 hour(s))  CBC  with Differential     Status: Abnormal   Collection Time: 10/26/18  7:43 PM  Result Value Ref Range   WBC 43.6 (H) 4.0 - 10.5 K/uL   RBC 4.80 3.87 - 5.11 MIL/uL   Hemoglobin 14.4 12.0 - 15.0 g/dL   HCT 42.7 36.0 - 46.0 %   MCV 89.0 80.0 - 100.0 fL   MCH 30.0 26.0 - 34.0 pg   MCHC 33.7 30.0 - 36.0 g/dL   RDW 12.7 11.5 - 15.5 %   Platelets 616 (H) 150 - 400 K/uL   nRBC 0.1 0.0 - 0.2 %   Neutrophils Relative % 82 %   Neutro Abs 35.7 (H) 1.7 - 7.7 K/uL   Lymphocytes Relative 5 %   Lymphs Abs 2.2 0.7 - 4.0 K/uL   Monocytes Relative 8 %   Monocytes Absolute 3.5 (H) 0.1 - 1.0 K/uL   Eosinophils Relative 0 %   Eosinophils Absolute 0.0 0.0 - 0.5 K/uL   Basophils Relative 0 %   Basophils Absolute 0.0 0.0 - 0.1 K/uL   WBC Morphology MILD LEFT SHIFT (1-5% METAS, OCC MYELO, OCC BANDS)    Immature Granulocytes 5 %   Abs Immature Granulocytes 2.18 (H) 0.00 - 0.07 K/uL    Comment: Performed at  [facility], [address] [place], [place] [address]  Comprehensive metabolic panel     Status: Abnormal   Collection Time: 10/26/18  7:43 PM  Result Value Ref Range   Sodium 132 (L) 135 - 145 mmol/L   Potassium 4.4 3.5 - 5.1 mmol/L    Comment: HEMOLYSIS AT THIS LEVEL MAY AFFECT RESULT   Chloride 97 (L) 98 - 111 mmol/L   CO2 21 (L) 22 - 32 mmol/L   Glucose, Bld 154 (H) 70 - 99 mg/dL   BUN 64 (H) 8 - 23 mg/dL   Creatinine, Ser 2.16 (H) 0.44 - 1.00 mg/dL   Calcium 7.7 (L) 8.9 - 10.3 mg/dL   Total Protein 5.7 (L) 6.5 - 8.1 g/dL   Albumin 2.2 (L) 3.5 - 5.0 g/dL   AST 34 15 - 41 U/L    Comment: HEMOLYSIS AT THIS LEVEL MAY AFFECT RESULT   ALT 28 0 - 44 U/L    Comment: HEMOLYSIS AT THIS LEVEL MAY AFFECT RESULT   Alkaline Phosphatase 161 (H) 38 - 126 U/L   Total Bilirubin 1.0 0.3 - 1.2 mg/dL    Comment: HEMOLYSIS AT THIS LEVEL MAY AFFECT RESULT   GFR calc non Af Amer 23 (L) >60 mL/min   GFR calc Af Amer 26 (L) >60 mL/min   Anion gap 14 5 - 15    Comment: Performed at [facility], [address] [place], [place] [address]  Lipase, blood     Status: None   Collection Time: 10/26/18  7:43 PM  Result Value Ref Range   Lipase 18 11 - 51 U/L    Comment: Performed at [facility], [address] [place], [place] [address]  Lactic acid, plasma     Status: None   Collection Time: 10/26/18 10:29 PM  Result Value Ref Range   Lactic Acid, Venous 1.5 0.5 - 1.9 mmol/L    Comment: Performed at [facility], [address] [place], [place] [address]  Blood culture (routine x 2)     Status: None (Preliminary result)   Collection Time: 10/26/18 10:29 PM  Result Value Ref Range   Specimen Description BLOOD BLOOD RIGHT WRIST    Special  Requests      BOTTLES DRAWN AEROBIC AND ANAEROBIC Blood Culture adequate volume   Culture      NO GROWTH < 12 HOURS Performed at [facility], [address] [place], [place] [address]    Report Status  PENDING   Blood culture (routine x 2)     Status: None (Preliminary result)   Collection Time: 10/26/18 10:29 PM  Result Value Ref Range   Specimen Description BLOOD BLOOD LEFT FOREARM    Special Requests      BOTTLES DRAWN AEROBIC AND ANAEROBIC Blood Culture adequate volume   Culture      NO GROWTH < 12 HOURS Performed at [facility], [address] [place], [place] [address]    Report Status PENDING   Urinalysis, Complete w Microscopic     Status: Abnormal   Collection Time: 10/26/18 11:47 PM  Result Value Ref Range   Color, Urine AMBER (A) YELLOW    Comment: BIOCHEMICALS MAY BE AFFECTED BY COLOR   APPearance CLOUDY (A) CLEAR   Specific Gravity, Urine 1.019 1.005 - 1.030   pH 5.0 5.0 - 8.0   Glucose, UA NEGATIVE NEGATIVE mg/dL   Hgb urine dipstick LARGE (A) NEGATIVE   Bilirubin Urine NEGATIVE NEGATIVE   Ketones, ur NEGATIVE NEGATIVE mg/dL   Protein, ur 100 (A) NEGATIVE mg/dL   Nitrite NEGATIVE NEGATIVE   Leukocytes,Ua MODERATE (A) NEGATIVE   RBC / HPF 21-50 0 - 5 RBC/hpf   WBC, UA >50 (H) 0 - 5 WBC/hpf   Bacteria, UA FEW (A) NONE SEEN   Squamous Epithelial / LPF 0-5 0 - 5   WBC Clumps PRESENT    Mucus PRESENT    Hyaline Casts, UA PRESENT     Comment: Performed at [facility], [address] [place], [place] [address]  CBC     Status: Abnormal   Collection Time: 10/27/18  9:15 AM  Result Value Ref Range   WBC 43.4 (H) 4.0 - 10.5 K/uL   RBC 5.02 3.87 - 5.11 MIL/uL   Hemoglobin 14.8 12.0 - 15.0 g/dL   HCT 45.4 36.0 - 46.0 %   MCV 90.4 80.0 - 100.0 fL   MCH 29.5 26.0 - 34.0 pg   MCHC 32.6 30.0 - 36.0 g/dL   RDW 12.8 11.5 - 15.5 %   Platelets 631 (H) 150 - 400 K/uL   nRBC 0.0 0.0 - 0.2 %    Comment: Performed at [facility], [address] [place], [place] [address]  Comprehensive metabolic panel     Status: Abnormal   Collection Time: 10/27/18  9:15 AM  Result Value Ref Range   Sodium 133 (L) 135 - 145 mmol/L   Potassium 3.8 3.5 -  5.1 mmol/L   Chloride 102 98 - 111 mmol/L   CO2 20 (L) 22 - 32 mmol/L   Glucose, Bld 151 (H) 70 - 99 mg/dL   BUN 65 (H) 8 - 23 mg/dL   Creatinine, Ser 1.76 (H) 0.44 - 1.00 mg/dL   Calcium 7.9 (L) 8.9 - 10.3 mg/dL   Total Protein 5.2 (L) 6.5 - 8.1 g/dL   Albumin 2.0 (L) 3.5 - 5.0 g/dL   AST 17 15 - 41 U/L   ALT 24 0 - 44 U/L   Alkaline Phosphatase 124 38 - 126 U/L   Total Bilirubin 0.4 0.3 - 1.2 mg/dL   GFR calc non Af Amer 29 (L) >60 mL/min   GFR calc Af Amer 34 (  L) >60 mL/min   Anion gap 11 5 - 15    Comment: Performed at [facility], [address] [place], [place] [address]  Protime-INR     Status: None   Collection Time: 10/27/18  9:15 AM  Result Value Ref Range   Prothrombin Time 14.9 11.4 - 15.2 seconds   INR 1.2 0.8 - 1.2    Comment: (NOTE) INR goal varies based on device and disease states. Performed at [facility], [address] [place], [place] [address]   APTT     Status: None   Collection Time: 10/27/18  9:15 AM  Result Value Ref Range   aPTT 27 24 - 36 seconds    Comment: Performed at [facility], [address] [place], [place] [address]  Glucose, capillary     Status: Abnormal   Collection Time: 10/27/18  9:17 AM  Result Value Ref Range   Glucose-Capillary 146 (H) 70 - 99 mg/dL   Ct Abdomen Pelvis Wo Contrast  Result Date: 10/26/2018 CLINICAL DATA:  Diarrhea and leukocytosis. EXAM: CT ABDOMEN AND PELVIS WITHOUT CONTRAST TECHNIQUE: Multidetector CT imaging of the abdomen and pelvis was performed following the standard protocol without IV contrast. COMPARISON:  None. FINDINGS: LOWER CHEST: Trace left pleural effusion with atelectasis. HEPATOBILIARY: The hepatic contours and density are normal. There is no intra- or extrahepatic biliary dilatation. The gallbladder is normal. PANCREAS: The pancreatic parenchymal contours are normal and there is no ductal dilatation. There is no peripancreatic fluid collection. SPLEEN: Normal.  ADRENALS/URINARY TRACT: --Adrenal glands: Normal. --Right kidney/ureter: No hydronephrosis, nephroureterolithiasis, perinephric stranding or solid renal mass. --Left kidney/ureter: No hydronephrosis, nephroureterolithiasis, perinephric stranding or solid renal mass. --Urinary bladder: Normal for degree of distention STOMACH/BOWEL: --Stomach/Duodenum: There is no hiatal hernia or other gastric abnormality. The duodenal course and caliber are normal. --Small bowel: No dilatation or inflammation. --Colon: There is long segment colonic wall thickening with surrounding inflammatory stranding and adjacent free fluid, involving essentially the entire colon. No free intraperitoneal air. No walled-off collection. --Appendix: Normal. VASCULAR/LYMPHATIC: There is aortic atherosclerosis without hemodynamically significant stenosis. No abdominal or pelvic lymphadenopathy. REPRODUCTIVE: Normal uterus. No adnexal mass. MUSCULOSKELETAL. Left total hip arthroplasty. Multilevel facet hypertrophy. No acute abnormality. OTHER: None. IMPRESSION: 1. Long segment colitis involving essentially the entire colon, likely infectious pancolitis. 2. No free intraperitoneal air or abscess formation. 3. Trace left pleural effusion with associated atelectasis. 4.  Aortic atherosclerosis (ICD10-I70.0). Electronically Signed   By: Ulyses Jarred M.D.   On: 10/26/2018 22:19    Pending Labs Unresulted Labs (From admission, onward)    Start     Ordered   10/28/18 0500  CBC with Differential/Platelet  Tomorrow morning,   STAT     10/27/18 1341   10/28/18 1308  Basic metabolic panel  Tomorrow morning,   STAT     10/27/18 1341   10/27/18 0140  Urine Culture  Add-on,   AD     10/27/18 0139   10/27/18 0135  HIV antibody (Routine Testing)  Once,   STAT     10/27/18 0135   10/27/18 0135  C difficile quick scan w PCR reflex  (C Difficile quick screen w PCR reflex panel)  Once, for 24 hours,   STAT     10/27/18 0135   10/26/18 1856  C difficile  quick scan w PCR reflex  (C Difficile quick screen w PCR reflex panel)  Once, for 24 hours,   STAT     10/26/18 1857  10/26/18 1856  Gastrointestinal Panel by PCR , Stool  (Gastrointestinal Panel by PCR, Stool)  Once,   STAT     10/26/18 1857          Vitals/Pain Today's Vitals   10/27/18 1353 10/27/18 1515 10/27/18 1518 10/27/18 1519  BP:   (!) 138/99   Pulse: 84 88 88   Resp:  (!) 21 (!) 24   Temp:      TempSrc:      SpO2: 96% 94% 92%   Weight:      Height:      PainSc:    7     Isolation Precautions Enteric precautions (UV disinfection)  Medications Medications  acetaminophen (TYLENOL) tablet 325-650 mg (has no administration in time range)  traMADol (ULTRAM) tablet 50 mg (has no administration in time range)  methocarbamol (ROBAXIN) tablet 500 mg (has no administration in time range)  0.9 %  sodium chloride infusion ( Intravenous New Bag/Given 10/27/18 1309)  ondansetron (ZOFRAN) tablet 4 mg ( Oral See Alternative 10/27/18 1337)    Or  ondansetron (ZOFRAN) injection 4 mg (4 mg Intravenous Given 10/27/18 1337)  heparin injection 5,000 Units (5,000 Units Subcutaneous Given 10/27/18 1328)  sodium chloride flush (NS) 0.9 % injection 3 mL (3 mLs Intravenous Given 10/27/18 0139)  vancomycin (VANCOCIN) 50 mg/mL oral solution 125 mg (125 mg Oral Refused 10/27/18 1512)  cefTRIAXone (ROCEPHIN) 1 g in sodium chloride 0.9 % 100 mL IVPB (0 g Intravenous Stopped 10/27/18 0439)  0.9 %  sodium chloride infusion ( Intravenous Stopped 10/26/18 2107)  morphine 4 MG/ML injection 4 mg (4 mg Intravenous Given 10/26/18 2005)  ondansetron (ZOFRAN) injection 4 mg (4 mg Intravenous Given 10/26/18 2007)  0.9 %  sodium chloride infusion ( Intravenous Stopped 10/26/18 2221)  calcium gluconate 1 g/ 50 mL sodium chloride IVPB (0 mg Intravenous Stopped 10/27/18 0000)  metroNIDAZOLE (FLAGYL) IVPB 500 mg (0 mg Intravenous Stopped 10/27/18 0056)    Mobility walks Moderate fall risk   Focused Assessments diarrhea;  nausea; weakness; GFR dec; plts inc   R Recommendations: See Admitting Provider Note  Report given to:   Additional Notes:  NS at 185mL/hr; A&Ox4; L arm 20g IV; temporarily needs person assisting when walking; VSS.

## 2018-10-27 NOTE — ED Notes (Signed)
Pt repositioned in bed.

## 2018-10-27 NOTE — ED Notes (Signed)
Pt given clear liquid diet tray.

## 2018-10-27 NOTE — ED Notes (Signed)
Pt was changed twice after having BM, Nurse collected stool sample and bed linen was changed. Nurse brought pt drink and ice. Olivia Rodriguez

## 2018-10-27 NOTE — ED Notes (Signed)
Awaiting medication from pharmacy.

## 2018-10-27 NOTE — Progress Notes (Addendum)
[place] at [place] NAME: Olivia Rodriguez    MR#:  [id number]  DATE OF BIRTH:  1950/11/07  SUBJECTIVE:   Patient continuing to have some crampy abdominal pain this morning.  She has had multiple episodes of foul-smelling diarrhea at home, but has not had any BMs here.  She is also nauseous.  She feels like her abdomen is swollen.  She denies any vomiting, shortness of breath, or chest pain.  REVIEW OF SYSTEMS:  Review of Systems  Constitutional: Negative for chills and fever.  HENT: Negative for congestion and sore throat.   Eyes: Negative for blurred vision and double vision.  Respiratory: Negative for cough and shortness of breath.   Cardiovascular: Negative for chest pain and palpitations.  Gastrointestinal: Positive for abdominal pain, diarrhea and nausea. Negative for vomiting.  Genitourinary: Negative for dysuria and urgency.  Musculoskeletal: Negative for back pain and neck pain.  Neurological: Negative for dizziness and headaches.  Psychiatric/Behavioral: Negative for depression. The patient is not nervous/anxious.     DRUG ALLERGIES:   Allergies  Allergen Reactions  . Penicillins Hives and Other (See Comments)    Did it involve swelling of the face/tongue/throat, SOB, or low BP? No Did it involve sudden or severe rash/hives, skin peeling, or any reaction on the inside of your mouth or nose? Yes Did you need to seek medical attention at a hospital or doctor's office? Yes When did it last happen? Over 60 years ago If all above answers are "NO", may proceed with cephalosporin use.    VITALS:  Blood pressure (!) 133/97, pulse 85, temperature 97.7 F (36.5 C), temperature source Oral, resp. rate 18, height 5\' 2"  (1.575 m), weight 115.7 kg, SpO2 94 %. PHYSICAL EXAMINATION:  Physical Exam  GENERAL:  68 y.o.-year-old patient lying in the bed with no acute distress.  EYES: Pupils equal, round, reactive to light and accommodation.  No scleral icterus. Extraocular muscles intact.  HEENT: Head atraumatic, normocephalic. Oropharynx and nasopharynx clear.  NECK:  Supple, no jugular venous distention. No thyroid enlargement, no tenderness.  LUNGS: Normal breath sounds bilaterally, no wheezing, rales,rhonchi or crepitation. No use of accessory muscles of respiration.  CARDIOVASCULAR: RRR, S1, S2 normal. No murmurs, rubs, or gallops.  ABDOMEN: Soft, mild diffuse tenderness, +distention. Bowel sounds present. No organomegaly or mass. + Umbilical hernia present, nontender and easily reducible. EXTREMITIES: No pedal edema, cyanosis, or clubbing.  NEUROLOGIC: Cranial nerves II through XII are intact. Muscle strength 5/5 in all extremities. Sensation intact. Gait not checked.  PSYCHIATRIC: The patient is alert and oriented x 3.  SKIN: No obvious rash, lesion, or ulcer.  LABORATORY PANEL:  Female CBC Recent Labs  Lab 10/27/18 0915  WBC 43.4*  HGB 14.8  HCT 45.4  PLT 631*   ------------------------------------------------------------------------------------------------------------------ Chemistries  Recent Labs  Lab 10/27/18 0915  NA 133*  K 3.8  CL 102  CO2 20*  GLUCOSE 151*  BUN 65*  CREATININE 1.76*  CALCIUM 7.9*  AST 17  ALT 24  ALKPHOS 124  BILITOT 0.4   RADIOLOGY:  Ct Abdomen Pelvis Wo Contrast  Result Date: 10/26/2018 CLINICAL DATA:  Diarrhea and leukocytosis. EXAM: CT ABDOMEN AND PELVIS WITHOUT CONTRAST TECHNIQUE: Multidetector CT imaging of the abdomen and pelvis was performed following the standard protocol without IV contrast. COMPARISON:  None. FINDINGS: LOWER CHEST: Trace left pleural effusion with atelectasis. HEPATOBILIARY: The hepatic contours and density are normal. There is no intra- or extrahepatic biliary dilatation. The  gallbladder is normal. PANCREAS: The pancreatic parenchymal contours are normal and there is no ductal dilatation. There is no peripancreatic fluid collection. SPLEEN: Normal.  ADRENALS/URINARY TRACT: --Adrenal glands: Normal. --Right kidney/ureter: No hydronephrosis, nephroureterolithiasis, perinephric stranding or solid renal mass. --Left kidney/ureter: No hydronephrosis, nephroureterolithiasis, perinephric stranding or solid renal mass. --Urinary bladder: Normal for degree of distention STOMACH/BOWEL: --Stomach/Duodenum: There is no hiatal hernia or other gastric abnormality. The duodenal course and caliber are normal. --Small bowel: No dilatation or inflammation. --Colon: There is long segment colonic wall thickening with surrounding inflammatory stranding and adjacent free fluid, involving essentially the entire colon. No free intraperitoneal air. No walled-off collection. --Appendix: Normal. VASCULAR/LYMPHATIC: There is aortic atherosclerosis without hemodynamically significant stenosis. No abdominal or pelvic lymphadenopathy. REPRODUCTIVE: Normal uterus. No adnexal mass. MUSCULOSKELETAL. Left total hip arthroplasty. Multilevel facet hypertrophy. No acute abnormality. OTHER: None. IMPRESSION: 1. Long segment colitis involving essentially the entire colon, likely infectious pancolitis. 2. No free intraperitoneal air or abscess formation. 3. Trace left pleural effusion with associated atelectasis. 4.  Aortic atherosclerosis (ICD10-I70.0). Electronically Signed   By: Ulyses Jarred M.D.   On: 10/26/2018 22:19   ASSESSMENT AND PLAN:   Colitis: Likely C. Difficile due to leukocytosis and recent antibiotic use.  Has not had any BMs in the ED. Patient not meeting sepsis criteria on admission. -Check C. difficile and GI pathogen panel -Stop IV Flagyl -Continue oral vancomycin -GI consult pending  -Blood culture with no growth to date  Leukocytosis- likely due to above -Treatment as above -Monitor  AKI- likely prerenal from diarrhea and dehydration, also with urinary retention and possible UTI.  Creatinine improved from 2.16 > 1.76. -Continue IV fluids -Avoid nephrotoxic  agents  -Monitor  Acute urinary retention and ?UTI- patient denies any dysuria, frequency, urgency. -Foley catheter in place -Continue ceftriaxone for now, but will discontinue if urine culture comes back negative.  Recent hip fracture-patient currently residing at SNF -Tylenol and tramadol as needed pain -Plan to discharge back to SNF  All the records are reviewed and case discussed with Care Management/Social Worker. Management plans discussed with the patient, family and they are in agreement.  CODE STATUS: Full Code  TOTAL TIME TAKING CARE OF THIS PATIENT: 40 minutes.   More than 50% of the time was spent in counseling/coordination of care: YES  POSSIBLE D/C IN 1-2 DAYS, DEPENDING ON CLINICAL CONDITION.   Berna Spare Runette Scifres M.D on 10/27/2018 at 1:31 PM  Between 7am to 6pm - Pager - [phone]  After 6pm go to [email or web address] - Proofreader  [facility] [place]  Office  [phone]  CC: Primary care physician; Patient, No Pcp Per  Note: This dictation was prepared with Dragon dictation along with smaller phrase technology. Any transcriptional errors that result from this process are unintentional.

## 2018-10-28 ENCOUNTER — Inpatient Hospital Stay: Payer: Medicare Other

## 2018-10-28 LAB — BASIC METABOLIC PANEL
Anion gap: 9 (ref 5–15)
BUN: 66 mg/dL — ABNORMAL HIGH (ref 8–23)
CO2: 23 mmol/L (ref 22–32)
Calcium: 7.9 mg/dL — ABNORMAL LOW (ref 8.9–10.3)
Chloride: 102 mmol/L (ref 98–111)
Creatinine, Ser: 1.56 mg/dL — ABNORMAL HIGH (ref 0.44–1.00)
GFR calc Af Amer: 39 mL/min — ABNORMAL LOW (ref >60)
GFR calc non Af Amer: 34 mL/min — ABNORMAL LOW (ref >60)
Glucose, Bld: 139 mg/dL — ABNORMAL HIGH (ref 70–99)
Potassium: 4.5 mmol/L (ref 3.5–5.1)
Sodium: 134 mmol/L — ABNORMAL LOW (ref 135–145)

## 2018-10-28 LAB — CBC WITH DIFFERENTIAL/PLATELET
Abs Immature Granulocytes: 2.03 10*3/uL — ABNORMAL HIGH (ref 0.00–0.07)
Basophils Absolute: 0 10*3/uL (ref 0.0–0.1)
Basophils Relative: 0 %
Eosinophils Absolute: 0 10*3/uL (ref 0.0–0.5)
Eosinophils Relative: 0 %
HEMATOCRIT: 44 % (ref 36.0–46.0)
Hemoglobin: 14.5 g/dL (ref 12.0–15.0)
Immature Granulocytes: 6 %
Lymphocytes Relative: 8 %
Lymphs Abs: 2.8 10*3/uL (ref 0.7–4.0)
MCH: 29.7 pg (ref 26.0–34.0)
MCHC: 33 g/dL (ref 30.0–36.0)
MCV: 90.2 fL (ref 80.0–100.0)
MONO ABS: 2.7 10*3/uL — AB (ref 0.1–1.0)
Monocytes Relative: 8 %
Neutro Abs: 27.5 10*3/uL — ABNORMAL HIGH (ref 1.7–7.7)
Neutrophils Relative %: 78 %
Platelets: 611 10*3/uL — ABNORMAL HIGH (ref 150–400)
RBC: 4.88 MIL/uL (ref 3.87–5.11)
RDW: 12.8 % (ref 11.5–15.5)
WBC: 35.1 10*3/uL — ABNORMAL HIGH (ref 4.0–10.5)
nRBC: 0.1 % (ref 0.0–0.2)

## 2018-10-28 LAB — URINE CULTURE

## 2018-10-28 LAB — HIV ANTIBODY (ROUTINE TESTING W REFLEX): HIV Screen 4th Generation wRfx: NONREACTIVE

## 2018-10-28 MED ORDER — MORPHINE SULFATE (PF) 2 MG/ML IV SOLN
2.0000 mg | INTRAVENOUS | Status: DC | PRN
  Administered 2018-10-28 (×2): 2 mg via INTRAVENOUS
  Filled 2018-10-28 (×2): qty 1

## 2018-10-28 MED ORDER — HYDRALAZINE HCL 20 MG/ML IJ SOLN
5.0000 mg | INTRAMUSCULAR | Status: DC | PRN
  Administered 2018-10-28 – 2018-10-29 (×2): 5 mg via INTRAVENOUS
  Filled 2018-10-28 (×2): qty 1

## 2018-10-28 MED ORDER — SODIUM CHLORIDE 0.9 % IV SOLN
INTRAVENOUS | Status: DC
  Administered 2018-10-28 – 2018-11-01 (×6): via INTRAVENOUS

## 2018-10-28 NOTE — NC FL2 (Signed)
  [place] LEVEL OF CARE SCREENING TOOL     IDENTIFICATION  Patient Name: Olivia Rodriguez Birthdate: 10/10/1950 Sex: female Admission Date (Current Location): 10/26/2018  [place] and [place] Number:  Engineering geologist and Address:  [facility], [address], [place], [place] [address]      Provider Number: [id number]  Attending Physician Name and Address:  Sela Hua, MD  Relative Name and Phone Number:       Current Level of Care: Hospital Recommended Level of Care: [place] Prior Approval Number:    Date Approved/Denied:   PASRR Number: [id number]  Discharge Plan: SNF    Current Diagnoses: Patient Active Problem List   Diagnosis Date Noted  . Colitis 10/27/2018  . Status post total hip replacement, left 10/06/2018    Orientation RESPIRATION BLADDER Height & Weight     Self, Time, Situation, Place  Normal Continent Weight: 255 lb (115.7 kg) Height:  5\' 2"  (157.5 cm)  BEHAVIORAL SYMPTOMS/MOOD NEUROLOGICAL BOWEL NUTRITION STATUS  (none) (none) Continent Diet(clear liquid)  AMBULATORY STATUS COMMUNICATION OF NEEDS Skin   Extensive Assist Verbally Normal                       Personal Care Assistance Level of Assistance  Bathing, Dressing Bathing Assistance: Limited assistance Feeding assistance: Independent Dressing Assistance: Limited assistance     Functional Limitations Info  Hearing   Hearing Info: Impaired      SPECIAL CARE FACTORS FREQUENCY  PT (By licensed PT)                    Contractures Contractures Info: Not present    Additional Factors Info  Code Status, Allergies, Isolation Precautions(cdiff) Code Status Info: full Allergies Info: pcn's     Isolation Precautions Info: c diff     Current Medications (10/28/2018):  This is the current hospital active medication list Current Facility-Administered Medications  Medication Dose Route Frequency Provider Last Rate  Last Dose  . acetaminophen (TYLENOL) tablet 325-650 mg  325-650 mg Oral Q6H PRN Sedalia Muta, MD      . heparin injection 5,000 Units  5,000 Units Subcutaneous Q8H Sedalia Muta, MD   5,000 Units at 10/28/18 1315  . hydrALAZINE (APRESOLINE) injection 5 mg  5 mg Intravenous Q4H PRN Mayo, Pete Pelt, MD   5 mg at 10/28/18 0805  . methocarbamol (ROBAXIN) tablet 500 mg  500 mg Oral Q6H PRN Sedalia Muta, MD      . morphine 2 MG/ML injection 2 mg  2 mg Intravenous Q3H PRN Mayo, Pete Pelt, MD   2 mg at 10/28/18 1119  . ondansetron (ZOFRAN) tablet 4 mg  4 mg Oral Q6H PRN Sedalia Muta, MD       Or  . ondansetron [facility]) injection 4 mg  4 mg Intravenous Q6H PRN Sedalia Muta, MD   4 mg at 10/28/18 0839  . sodium chloride flush (NS) 0.9 % injection 3 mL  3 mL Intravenous Q12H Sedalia Muta, MD   3 mL at 10/28/18 0843  . vancomycin (VANCOCIN) 50 mg/mL oral solution 125 mg  125 mg Oral QID Sedalia Muta, MD   125 mg at 10/28/18 1316     Discharge Medications: Please see discharge summary for a list of discharge medications.  Relevant Imaging Results:  Relevant Lab Results:   Additional Information ss: [id number]  Shela Leff, LCSW

## 2018-10-28 NOTE — Clinical Social Work Note (Signed)
Clinical Social Work Assessment  Patient Details  Name: Olivia Rodriguez MRN: [id number] Date of Birth: [birth year]  Date of referral:  10/28/18               Reason for consult:  Discharge Planning                Permission sought to share information with:  Chartered certified accountant granted to share information::  Yes, Verbal Permission Granted  Name::        Agency::     Relationship::     Contact Information:     Housing/Transportation Living arrangements for the past 2 months:  Single Family Home Source of Information:  Patient Patient Interpreter Needed:  None Criminal Activity/Legal Involvement Pertinent to Current Situation/Hospitalization:  No - Comment as needed Significant Relationships:  Adult Children Lives with:  Self Do you feel safe going back to the place where you live?  Yes Need for family participation in patient care:  No (Coment)  Care giving concerns:  Patient resides alone at home.    Social Worker assessment / plan:  CSW spoke with patient this afternoon. She has been at [facility] for rehab. She states she does not want to return because she got sick from being there. Patient stated that she still wishes to think about going home versus skilled rehab but is allowing CSW to initiate a bed search in the event she chooses to go to short term rehab.  Employment status:  Retired Forensic scientist:  Managed Care PT Recommendations:  [place] / Referral to community resources:     Patient/Family's Response to care:  Patient expressed appreciation for CSW assistance.  Patient/Family's Understanding of and Emotional Response to Diagnosis, Current Treatment, and Prognosis:  Patient does not want to go to rehab and would like to go home but she realizes that she may not be able to manage alone.   Emotional Assessment Appearance:  Appears stated age Attitude/Demeanor/Rapport:  (pleasant and cooperative) Affect (typically  observed):  Accepting, Calm Orientation:  Oriented to Self, Oriented to Place, Oriented to  Time, Oriented to Situation Alcohol / Substance use:  Not Applicable Psych involvement (Current and /or in the community):  No (Comment)  Discharge Needs  Concerns to be addressed:  Care Coordination Readmission within the last 30 days:  No Current discharge risk:  None Barriers to Discharge:  No Barriers Identified   Shela Leff, [place] 10/28/2018, 3:15 PM

## 2018-10-28 NOTE — Progress Notes (Signed)
Per MD okay for RN to DC IVF and telemetry monitor.

## 2018-10-28 NOTE — Progress Notes (Addendum)
Olivia Darby, MD [address]  [place], [place] [address]  Main: [phone]  Fax: [phone] Pager: [phone]   Subjective: She reports feeling better today, receiving morphine for abdominal pain.  Feels nauseous.  Tolerating clear liquid diet.  Reports passing some gas.  Abdomen is less distended today.  Denies fever.   Objective: Vital signs in last 24 hours: Vitals:   10/28/18 0805 10/28/18 0828 10/28/18 1120 10/28/18 1735  BP: (!) 175/100 (!) 176/87 (!) 168/86 (!) 155/90  Pulse:  96 99 83  Resp:   20   Temp:   97.9 F (36.6 C)   TempSrc:   Oral   SpO2:   96%   Weight:      Height:       Weight change:   Intake/Output Summary (Last 24 hours) at 10/28/2018 1933 Last data filed at 10/28/2018 1446 Gross per 24 hour  Intake 2353.59 ml  Output 1550 ml  Net 803.59 ml     Exam: Heart:: Regular rate and rhythm or S1S2 present Lungs: normal and clear to auscultation Abdomen: Soft, diffusely distended, tympanic to percussion, hypoactive bowel sounds, nontender   Lab Results: CBC Latest Ref Rng & Units 10/28/2018 10/27/2018 10/26/2018  WBC 4.0 - 10.5 K/uL 35.1(H) 43.4(H) 43.6(H)  Hemoglobin 12.0 - 15.0 g/dL 14.5 14.8 14.4  Hematocrit 36.0 - 46.0 % 44.0 45.4 42.7  Platelets 150 - 400 K/uL 611(H) 631(H) 616(H)   CMP Latest Ref Rng & Units 10/28/2018 10/27/2018 10/26/2018  Glucose 70 - 99 mg/dL 139(H) 151(H) 154(H)  BUN 8 - 23 mg/dL 66(H) 65(H) 64(H)  Creatinine 0.44 - 1.00 mg/dL 1.56(H) 1.76(H) 2.16(H)  Sodium 135 - 145 mmol/L 134(L) 133(L) 132(L)  Potassium 3.5 - 5.1 mmol/L 4.5 3.8 4.4  Chloride 98 - 111 mmol/L 102 102 97(L)  CO2 22 - 32 mmol/L 23 20(L) 21(L)  Calcium 8.9 - 10.3 mg/dL 7.9(L) 7.9(L) 7.7(L)  Total Protein 6.5 - 8.1 g/dL - 5.2(L) 5.7(L)  Total Bilirubin 0.3 - 1.2 mg/dL - 0.4 1.0  Alkaline Phos 38 - 126 U/L - 124 161(H)  AST 15 - 41 U/L - 17 34  ALT 0 - 44 U/L - 24 28   Micro Results: Recent Results (from the past 240 hour(s))   Blood culture (routine x 2)     Status: None (Preliminary result)   Collection Time: 10/26/18 10:29 PM  Result Value Ref Range Status   Specimen Description BLOOD BLOOD RIGHT WRIST  Final   Special Requests   Final    BOTTLES DRAWN AEROBIC AND ANAEROBIC Blood Culture adequate volume   Culture   Final    NO GROWTH 2 DAYS Performed at [facility], [address] [place], [place] [address]    Report Status PENDING  Incomplete  Blood culture (routine x 2)     Status: None (Preliminary result)   Collection Time: 10/26/18 10:29 PM  Result Value Ref Range Status   Specimen Description BLOOD BLOOD LEFT FOREARM  Final   Special Requests   Final    BOTTLES DRAWN AEROBIC AND ANAEROBIC Blood Culture adequate volume   Culture   Final    NO GROWTH 2 DAYS Performed at [facility], [address], [place],  [address]    Report Status PENDING  Incomplete  Urine Culture     Status: Abnormal   Collection Time: 10/26/18 11:47 PM  Result Value Ref Range Status   Specimen Description   Final  URINE, RANDOM Performed at [facility], [address] [place], [place] [address]    Special Requests   Final    NONE Performed at [facility], [address] [place], [place] [address]    Culture MULTIPLE SPECIES PRESENT, SUGGEST RECOLLECTION (A)  Final   Report Status 10/28/2018 FINAL  Final  C difficile quick scan w PCR reflex     Status: Abnormal   Collection Time: 10/27/18  9:15 AM  Result Value Ref Range Status   C Diff antigen POSITIVE (A) NEGATIVE Final   C Diff toxin POSITIVE (A) NEGATIVE Final   C Diff interpretation Toxin producing C. difficile detected.  Final    Comment: CRITICAL RESULT CALLED TO, READ BACK BY AND VERIFIED WITH: [place] HAHLE 10/27/18 1730 KLW Performed at [facility], [address], [place], [place] [address]    Studies/Results: Ct Abdomen Pelvis Wo Contrast  Result Date: 10/26/2018 CLINICAL DATA:   Diarrhea and leukocytosis. EXAM: CT ABDOMEN AND PELVIS WITHOUT CONTRAST TECHNIQUE: Multidetector CT imaging of the abdomen and pelvis was performed following the standard protocol without IV contrast. COMPARISON:  None. FINDINGS: LOWER CHEST: Trace left pleural effusion with atelectasis. HEPATOBILIARY: The hepatic contours and density are normal. There is no intra- or extrahepatic biliary dilatation. The gallbladder is normal. PANCREAS: The pancreatic parenchymal contours are normal and there is no ductal dilatation. There is no peripancreatic fluid collection. SPLEEN: Normal. ADRENALS/URINARY TRACT: --Adrenal glands: Normal. --Right kidney/ureter: No hydronephrosis, nephroureterolithiasis, perinephric stranding or solid renal mass. --Left kidney/ureter: No hydronephrosis, nephroureterolithiasis, perinephric stranding or solid renal mass. --Urinary bladder: Normal for degree of distention STOMACH/BOWEL: --Stomach/Duodenum: There is no hiatal hernia or other gastric abnormality. The duodenal course and caliber are normal. --Small bowel: No dilatation or inflammation. --Colon: There is long segment colonic wall thickening with surrounding inflammatory stranding and adjacent free fluid, involving essentially the entire colon. No free intraperitoneal air. No walled-off collection. --Appendix: Normal. VASCULAR/LYMPHATIC: There is aortic atherosclerosis without hemodynamically significant stenosis. No abdominal or pelvic lymphadenopathy. REPRODUCTIVE: Normal uterus. No adnexal mass. MUSCULOSKELETAL. Left total hip arthroplasty. Multilevel facet hypertrophy. No acute abnormality. OTHER: None. IMPRESSION: 1. Long segment colitis involving essentially the entire colon, likely infectious pancolitis. 2. No free intraperitoneal air or abscess formation. 3. Trace left pleural effusion with associated atelectasis. 4.  Aortic atherosclerosis (ICD10-I70.0). Electronically Signed   By: Ulyses Jarred M.D.   On: 10/26/2018 22:19    Medications:  I have reviewed the patient's current medications. Prior to Admission:  Medications Prior to Admission  Medication Sig Dispense Refill Last Dose  . Amino Acids-Protein Hydrolys (FEEDING SUPPLEMENT, PRO-STAT SUGAR FREE 64,) LIQD Take 30 mLs by mouth 2 (two) times daily between meals.   10/26/2018 at 0900  . benzonatate (TESSALON) 100 MG capsule Take 100 mg by mouth 3 (three) times daily as needed for cough.   10/26/2018 at 0900  . fluticasone (FLONASE) 50 MCG/ACT nasal spray Place 2 sprays into both nostrils daily.   10/25/2018 at 2100  . hydrochlorothiazide (HYDRODIURIL) 12.5 MG tablet Take 12.5 mg by mouth daily.   10/26/2018 at 0900  . loratadine (CLARITIN) 10 MG tablet Take 10 mg by mouth at bedtime.   10/25/2018 at 2000  . mirabegron ER (MYRBETRIQ) 25 MG TB24 tablet Take 25 mg by mouth daily.   10/25/2018 at 2000  . Multiple Vitamin (MULTIVITAMIN WITH MINERALS) TABS tablet Take 1 tablet by mouth daily.   10/26/2018 at 0900  . saccharomyces boulardii (FLORASTOR) 250 MG capsule Take  250 mg by mouth 2 (two) times daily.   10/26/2018 at 0900  . traMADol (ULTRAM) 50 MG tablet Take 50 mg by mouth 4 (four) times daily.   prn at prn  . vitamin C (ASCORBIC ACID) 250 MG tablet Take 250 mg by mouth 2 (two) times daily.   10/26/2018 at 0900  . zinc sulfate 220 (50 Zn) MG capsule Take 220 mg by mouth daily.   10/26/2018 at 0900  . acetaminophen (TYLENOL) 325 MG tablet Take 1-2 tablets (325-650 mg total) by mouth every 6 (six) hours as needed for mild pain (pain score 1-3 or temp > 100.5).   prn at prn  . docusate sodium (COLACE) 100 MG capsule Take 1 capsule (100 mg total) by mouth 2 (two) times daily. 10 capsule 0 prn at prn  . enoxaparin (LOVENOX) 40 MG/0.4ML injection Inject 0.4 mLs (40 mg total) into the skin daily for 14 days. 14 Syringe 0   . methocarbamol (ROBAXIN) 500 MG tablet Take 1 tablet (500 mg total) by mouth every 6 (six) hours as needed for muscle spasms. 30 tablet 0 prn at prn  . oxyCODONE  10 MG TABS Take 1 tablet (10 mg total) by mouth every 3 (three) hours as needed for moderate pain. (Patient not taking: Reported on 10/26/2018) 30 tablet 0 Completed Course at Unknown time   Scheduled: . heparin  5,000 Units Subcutaneous Q8H  . sodium chloride flush  3 mL Intravenous Q12H  . vancomycin  125 mg Oral QID   Continuous:  RCV:[id number], hydrALAZINE, methocarbamol, morphine injection, ondansetron **OR** ondansetron (ZOFRAN) IV Anti-infectives (From admission, onward)   Start     Dose/Rate Route Frequency Ordered Stop   10/27/18 1000  vancomycin (VANCOCIN) 50 mg/mL oral solution 125 mg     125 mg Oral 4 times daily 10/27/18 0135 11/06/18 0959   10/27/18 0145  metroNIDAZOLE (FLAGYL) IVPB 500 mg  Status:  Discontinued     500 mg 100 mL/hr over 60 Minutes Intravenous Every 8 hours 10/27/18 0135 10/27/18 1314   10/27/18 0145  vancomycin (VANCOCIN) 50 mg/mL oral solution 125 mg  Status:  Discontinued     125 mg Oral Every 6 hours 10/27/18 0135 10/27/18 0548   10/27/18 0145  cefTRIAXone (ROCEPHIN) 1 g in sodium chloride 0.9 % 100 mL IVPB  Status:  Discontinued     1 g 200 mL/hr over 30 Minutes Intravenous Every 24 hours 10/27/18 0138 10/28/18 0914   10/27/18 0000  vancomycin (VANCOCIN) 50 mg/mL oral solution 125 mg  Status:  Discontinued     125 mg Oral Every 6 hours 10/26/18 2226 10/27/18 1312   10/26/18 2230  metroNIDAZOLE (FLAGYL) IVPB 500 mg     500 mg 100 mL/hr over 60 Minutes Intravenous  Once 10/26/18 2228 10/27/18 0056     Scheduled Meds: . heparin  5,000 Units Subcutaneous Q8H  . sodium chloride flush  3 mL Intravenous Q12H  . vancomycin  125 mg Oral QID   Continuous Infusions: PRN Meds:.acetaminophen, hydrALAZINE, methocarbamol, morphine injection, ondansetron **OR** ondansetron (ZOFRAN) IV   Assessment: Active Problems:   Colitis  Probable C. difficile colitis with recent antibiotic use Stool studies are pending Patient is clinically improving, WBC  trending down Renal function is improving  Plan: Clear liquid diet only Continue oral vancomycin 125 mg every 6 hourly X-ray KUB to assess abdominal distention Monitor CBC with differential Encourage patient to ambulate as tolerated Minimize narcotic use  Will follow along with you   LOS:  1 day   Elleanna Melling 10/28/2018, 7:33 PM

## 2018-10-28 NOTE — Progress Notes (Signed)
Dr. Jannifer Franklin notified by secure chat of dorsal pulses auscultated with doppler. Barbaraann Faster, RN 10:49 PM 10/28/2018

## 2018-10-28 NOTE — Progress Notes (Signed)
Dr. Jannifer Franklin notified of LLE temp, cap refill, strong pedal pulse and purple toes r/t Left hip surgery last month; Acknowledged; check dorsal pulse, use doppler if necessary and will consider Vascular consult in am. Barbaraann Faster, RN 9:40 PM 10/28/2018

## 2018-10-28 NOTE — Evaluation (Signed)
Physical Therapy Evaluation Patient Details Name: Olivia Rodriguez MRN: [id number] DOB: May 20, 1951 Today's Date: 10/28/2018   History of Present Illness  68 y.o. female s/p L THA anterior approach 10/06/18 was readmitted from SNF for c-diff symptoms, now referred to PT.  Pt is expecting to go home.  PMHx:  atherosclerosis, OA, L THA with direct anterior approach,   Clinical Impression  Pt was seen for re-eval after going to SNF for L THA rehab.  Pt is upset with SNF, reporting her c-diff was a result of being there.  Talked with her about the SNF not likely being the source of her infection, and the benefit of going back to a rehab setting.  Pt wishes to go home from hosp and thinks she will ask her family to assist her to get up the stairs for home.  Pt is quite dependent and not a good home candidate.  Will continue to recommend SNF due to [facility] limitations that will make steps difficult and her ongoing pain and difficulties with mobility, as well as WB limitations.  Follow acutely for gait and transfers, strengthening as tolerated.    Follow Up Recommendations SNF    Equipment Recommendations  None recommended by PT    Recommendations for Other Services       Precautions / Restrictions Precautions Precautions: Fall Precaution Booklet Issued: Yes (comment) Restrictions Weight Bearing Restrictions: Yes LLE Weight Bearing: Partial weight bearing LLE Partial Weight Bearing Percentage or Pounds: 50% Other Position/Activity Restrictions: per her admission from 2/14      Mobility  Bed Mobility Overal bed mobility: Needs Assistance Bed Mobility: Supine to Sit;Sit to Supine     Supine to sit: Min assist;HOB elevated(assisted LLE) Sit to supine: Mod assist   General bed mobility comments: assisted LE's and pt used bed rail as instructed  Transfers Overall transfer level: Needs assistance Equipment used: Rolling walker (2 wheeled) Transfers: Sit to/from Stand Sit to Stand: Mod assist          General transfer comment: assisted to stand from higher surface  Ambulation/Gait Ambulation/Gait assistance: Min assist Gait Distance (Feet): 5 Feet Assistive device: Rolling walker (2 wheeled) Gait Pattern/deviations: Step-to pattern;Decreased stride length;Wide base of support;Decreased stance time - left;Decreased weight shift to left Gait velocity: decreased Gait velocity interpretation: <1.8 ft/sec, indicate of risk for recurrent falls General Gait Details: reminders for safety with use of walker to stand upright and wb through arms to maintain precautions.  Stairs            Wheelchair Mobility    Modified Rankin (Stroke Patients Only)       Balance Overall balance assessment: Needs assistance Sitting-balance support: Feet supported Sitting balance-Leahy Scale: Fair     Standing balance support: Bilateral upper extremity supported;During functional activity Standing balance-Leahy Scale: Poor                               Pertinent Vitals/Pain Pain Assessment: 0-10 Pain Score: 6  Pain Location: L hip Pain Descriptors / Indicators: Sore;Discomfort Pain Intervention(s): Limited activity within patient's tolerance;Monitored during session;Premedicated before session;Repositioned    Home Living Family/patient expects to be discharged to:: Private residence Living Arrangements: Alone Available Help at Discharge: Family;Available 24 hours/day Type of Home: House Home Access: Stairs to enter Entrance Stairs-Rails: Chemical engineer of Steps: 2 steps with R railing from back (where she parks the car) and 4-5 steps with L railing from front Home Layout:  One level Home Equipment: Cane - single point;Grab bars - tub/shower      Prior Function Level of Independence: Needs assistance   Gait / Transfers Assistance Needed: SNF admission for short gait distances  ADL's / Homemaking Assistance Needed: pt is in SNF with staff assisting  all dressing and bathing        Hand Dominance   Dominant Hand: Right    Extremity/Trunk Assessment   Upper Extremity Assessment Upper Extremity Assessment: Generalized weakness    Lower Extremity Assessment Lower Extremity Assessment: Generalized weakness;LLE deficits/detail LLE Deficits / Details: s/p THA  LLE: Unable to fully assess due to pain LLE Coordination: decreased gross motor;decreased fine motor    Cervical / Trunk Assessment Cervical / Trunk Assessment: Normal  Communication   Communication: No difficulties  Cognition Arousal/Alertness: Awake/alert Behavior During Therapy: WFL for tasks assessed/performed Overall Cognitive Status: Within Functional Limits for tasks assessed                                        General Comments General comments (skin integrity, edema, etc.): Pt has clean incision on L hip with healed area, staples removed.    Exercises     Assessment/Plan    PT Assessment Patient needs continued PT services  PT Problem List Decreased strength;Decreased activity tolerance;Decreased balance;Decreased mobility;Decreased knowledge of use of DME;Decreased knowledge of precautions;Pain;Decreased skin integrity;Decreased range of motion;Decreased coordination;Decreased safety awareness;Obesity       PT Treatment Interventions DME instruction;Gait training;Stair training;Functional mobility training;Therapeutic activities;Therapeutic exercise;Balance training;Neuromuscular re-education;Patient/family education    PT Goals (Current goals can be found in the Care Plan section)  Acute Rehab PT Goals Patient Stated Goal: to go home PT Goal Formulation: With patient Time For Goal Achievement: 11/11/18 Potential to Achieve Goals: Fair    Frequency Min 2X/week   Barriers to discharge Inaccessible home environment;Decreased caregiver support home alone with 2 stairs to enter and MD reports PWB for two more weeks     Co-evaluation               AM-PAC PT "6 Clicks" Mobility  Outcome Measure Help needed turning from your back to your side while in a flat bed without using bedrails?: A Little Help needed moving from lying on your back to sitting on the side of a flat bed without using bedrails?: A Lot Help needed moving to and from a bed to a chair (including a wheelchair)?: A Lot Help needed standing up from a chair using your arms (e.g., wheelchair or bedside chair)?: A Lot Help needed to walk in hospital room?: A Little Help needed climbing 3-5 steps with a railing? : Total 6 Click Score: 13    End of Session Equipment Utilized During Treatment: Gait belt Activity Tolerance: Patient limited by fatigue;Patient limited by pain;Other (comment)(Fearful of moving) Patient left: in bed;with call bell/phone within reach;with bed alarm set(Pt declined to be OOB in chair) Nurse Communication: Mobility status PT Visit Diagnosis: Other abnormalities of gait and mobility (R26.89);Muscle weakness (generalized) (M62.81);History of falling (Z91.81);Difficulty in walking, not elsewhere classified (R26.2);Pain Pain - Right/Left: Left Pain - part of body: Hip    Time: 1015-1040 PT Time Calculation (min) (ACUTE ONLY): 25 min   Charges:   PT Evaluation $PT Eval Moderate Complexity: 1 Mod PT Treatments $Gait Training: 8-22 mins       Ramond Dial 10/28/2018, 1:00 PM

## 2018-10-28 NOTE — Progress Notes (Addendum)
[place] [facility] [place] NAME: Athanasia Stanwood    MR#:  [id number]  DATE OF BIRTH:  [birth year]  SUBJECTIVE:   Patient continues to feel very nauseated this morning.  This occurs anytime she tries to eat something.  Denies any vomiting.  She only had one bowel movement last night.  She also endorses some crampy abdominal pain in her lower abdomen.  No melena or hematochezia.  REVIEW OF SYSTEMS:  Review of Systems  Constitutional: Negative for chills and fever.  HENT: Negative for congestion and sore throat.   Eyes: Negative for blurred vision and double vision.  Respiratory: Negative for cough and shortness of breath.   Cardiovascular: Negative for chest pain and palpitations.  Gastrointestinal: Positive for abdominal pain, diarrhea and nausea. Negative for vomiting.  Genitourinary: Negative for dysuria and urgency.  Musculoskeletal: Negative for back pain and neck pain.  Neurological: Negative for dizziness and headaches.  Psychiatric/Behavioral: Negative for depression. The patient is not nervous/anxious.     DRUG ALLERGIES:   Allergies  Allergen Reactions  . Penicillins Hives and Other (See Comments)    Did it involve swelling of the face/tongue/throat, SOB, or low BP? No Did it involve sudden or severe rash/hives, skin peeling, or any reaction on the inside of your mouth or nose? Yes Did you need to seek medical attention at a hospital or doctor's office? Yes When did it last happen? Over 60 years ago If all above answers are "NO", may proceed with cephalosporin use.    VITALS:  Blood pressure (!) 168/86, pulse 99, temperature 97.9 F (36.6 C), temperature source Oral, resp. rate 20, height 5\' 2"  (1.575 m), weight 115.7 kg, SpO2 96 %. PHYSICAL EXAMINATION:  Physical Exam  GENERAL:  68 y.o.-year-old patient lying in the bed with no acute distress.  EYES: Pupils equal, round, reactive to light and accommodation. No scleral icterus.  Extraocular muscles intact.  HEENT: Head atraumatic, normocephalic. Oropharynx and nasopharynx clear.  NECK:  Supple, no jugular venous distention. No thyroid enlargement, no tenderness.  LUNGS: Normal breath sounds bilaterally, no wheezing, rales,rhonchi or crepitation. No use of accessory muscles of respiration.  CARDIOVASCULAR: RRR, S1, S2 normal. No murmurs, rubs, or gallops.  ABDOMEN: Soft, mild diffuse tenderness, + moderate distention. Bowel sounds present. No organomegaly or mass. + Umbilical hernia present, nontender and easily reducible. EXTREMITIES: No pedal edema, cyanosis, or clubbing.  NEUROLOGIC: Cranial nerves II through XII are intact. + Global weakness. Sensation intact. Gait not checked.  PSYCHIATRIC: The patient is alert and oriented x 3.  SKIN: No obvious rash, lesion, or ulcer.  LABORATORY PANEL:  Female CBC Recent Labs  Lab 10/28/18 0428  WBC 35.1*  HGB 14.5  HCT 44.0  PLT 611*   ------------------------------------------------------------------------------------------------------------------ Chemistries  Recent Labs  Lab 10/27/18 0915 10/28/18 0428  NA 133* 134*  K 3.8 4.5  CL 102 102  CO2 20* 23  GLUCOSE 151* 139*  BUN 65* 66*  CREATININE 1.76* 1.56*  CALCIUM 7.9* 7.9*  AST 17  --   ALT 24  --   ALKPHOS 124  --   BILITOT 0.4  --    RADIOLOGY:  No results found. ASSESSMENT AND PLAN:   C. difficile colitis-related to recent antibiotic use. Patient not meeting sepsis criteria on admission.  Leukocytosis improving. -Continue oral vancomycin -Blood culture with no growth to date -IV antiemetics prn  Leukocytosis- likely due to above.  WBC improving. -Treatment as above -Monitor  AKI-  likely prerenal from diarrhea and dehydration. Creatinine improving. -Will stop IV fluids and encourage p.o. intake -Avoid nephrotoxic agents  -Monitor  ?  UTI- patient with mildly abnormal UA on admission.  She denies any urinary symptoms. -Urine culture with  multiple species -Will discontinue ceftriaxone  Acute urinary retention- Foley catheter placed on admission -Will attempt voiding trial today  Recent hip fracture-patient currently residing at SNF -Tylenol and tramadol as needed pain -Patient declines discharge back to SNF, will plan on discharge home with home health  All the records are reviewed and case discussed with Care Management/Social Worker. Management plans discussed with the patient, family and they are in agreement.  CODE STATUS: Full Code  TOTAL TIME TAKING CARE OF THIS PATIENT: 35 minutes.   More than 50% of the time was spent in counseling/coordination of care: YES  POSSIBLE D/C tomorrow, DEPENDING ON CLINICAL CONDITION.   Berna Spare  M.D on 10/28/2018 at 1:44 PM  Between 7am to 6pm - Pager - [phone]  After 6pm go to [email or web address] - Proofreader  [facility]  Office  [phone]  CC: Primary care physician; Patient, No Pcp Per  Note: This dictation was prepared with Dragon dictation along with smaller phrase technology. Any transcriptional errors that result from this process are unintentional.

## 2018-10-29 ENCOUNTER — Inpatient Hospital Stay: Payer: Medicare Other

## 2018-10-29 DIAGNOSIS — Z96642 Presence of left artificial hip joint: Secondary | ICD-10-CM

## 2018-10-29 DIAGNOSIS — A0472 Enterocolitis due to Clostridium difficile, not specified as recurrent: Secondary | ICD-10-CM

## 2018-10-29 DIAGNOSIS — K529 Noninfective gastroenteritis and colitis, unspecified: Secondary | ICD-10-CM

## 2018-10-29 DIAGNOSIS — R230 Cyanosis: Secondary | ICD-10-CM

## 2018-10-29 LAB — TSH: TSH: 1.785 u[IU]/mL (ref 0.350–4.500)

## 2018-10-29 LAB — CBC
HCT: 43 % (ref 36.0–46.0)
Hemoglobin: 13.7 g/dL (ref 12.0–15.0)
MCH: 29.3 pg (ref 26.0–34.0)
MCHC: 31.9 g/dL (ref 30.0–36.0)
MCV: 91.9 fL (ref 80.0–100.0)
Platelets: 579 10*3/uL — ABNORMAL HIGH (ref 150–400)
RBC: 4.68 MIL/uL (ref 3.87–5.11)
RDW: 13 % (ref 11.5–15.5)
WBC: 29.7 10*3/uL — ABNORMAL HIGH (ref 4.0–10.5)
nRBC: 0.2 % (ref 0.0–0.2)

## 2018-10-29 LAB — BASIC METABOLIC PANEL
Anion gap: 9 (ref 5–15)
BUN: 59 mg/dL — ABNORMAL HIGH (ref 8–23)
CO2: 22 mmol/L (ref 22–32)
Calcium: 8 mg/dL — ABNORMAL LOW (ref 8.9–10.3)
Chloride: 102 mmol/L (ref 98–111)
Creatinine, Ser: 1 mg/dL (ref 0.44–1.00)
GFR calc Af Amer: >60 mL/min (ref >60)
GFR calc non Af Amer: 58 mL/min — ABNORMAL LOW (ref >60)
Glucose, Bld: 118 mg/dL — ABNORMAL HIGH (ref 70–99)
Potassium: 3.9 mmol/L (ref 3.5–5.1)
Sodium: 133 mmol/L — ABNORMAL LOW (ref 135–145)

## 2018-10-29 LAB — GLUCOSE, CAPILLARY
Glucose-Capillary: 105 mg/dL — ABNORMAL HIGH (ref 70–99)
Glucose-Capillary: 120 mg/dL — ABNORMAL HIGH (ref 70–99)

## 2018-10-29 MED ORDER — SACCHAROMYCES BOULARDII 250 MG PO CAPS
250.0000 mg | ORAL_CAPSULE | Freq: Two times a day (BID) | ORAL | Status: DC
  Filled 2018-10-29 (×2): qty 1

## 2018-10-29 MED ORDER — METRONIDAZOLE IN NACL 5-0.79 MG/ML-% IV SOLN
500.0000 mg | Freq: Three times a day (TID) | INTRAVENOUS | Status: DC
  Administered 2018-10-29 – 2018-11-01 (×8): 500 mg via INTRAVENOUS
  Filled 2018-10-29 (×10): qty 100

## 2018-10-29 MED ORDER — LORATADINE 10 MG PO TABS
10.0000 mg | ORAL_TABLET | Freq: Every day | ORAL | Status: DC
  Administered 2018-10-29 – 2018-11-02 (×3): 10 mg via ORAL
  Filled 2018-10-29 (×7): qty 1

## 2018-10-29 MED ORDER — OXYCODONE HCL 5 MG PO TABS
5.0000 mg | ORAL_TABLET | Freq: Four times a day (QID) | ORAL | Status: DC | PRN
  Administered 2018-10-29: 5 mg via ORAL
  Filled 2018-10-29: qty 1

## 2018-10-29 MED ORDER — HYDROCHLOROTHIAZIDE 25 MG PO TABS
12.5000 mg | ORAL_TABLET | Freq: Every day | ORAL | Status: DC
  Administered 2018-10-29 – 2018-11-05 (×8): 12.5 mg via ORAL
  Filled 2018-10-29 (×8): qty 1

## 2018-10-29 MED ORDER — MORPHINE SULFATE (PF) 2 MG/ML IV SOLN: 2.0000 mg | INTRAVENOUS | Status: DC | PRN

## 2018-10-29 MED ORDER — FLUTICASONE PROPIONATE 50 MCG/ACT NA SUSP
2.0000 | Freq: Every day | NASAL | Status: DC
  Administered 2018-10-29 – 2018-11-01 (×4): 2 via NASAL
  Filled 2018-10-29: qty 16

## 2018-10-29 MED ORDER — ADULT MULTIVITAMIN W/MINERALS CH: 1.0000 | ORAL_TABLET | Freq: Every day | ORAL | Status: DC

## 2018-10-29 MED ORDER — VANCOMYCIN HCL 500 MG IV SOLR
500.0000 mg | Freq: Four times a day (QID) | Status: DC
  Administered 2018-10-29: 500 mg via RECTAL
  Filled 2018-10-29 (×10): qty 500

## 2018-10-29 NOTE — Progress Notes (Signed)
PT Cancellation Note  Patient Details Name: Olivia Rodriguez MRN: [id number] DOB: 15-Jun-1951   Cancelled Treatment:    Reason Eval/Treat Not Completed: Other (comment)   Pt declined attempt again this pm.  Will continue in am.   Chesley Noon 10/29/2018, 3:55 PM

## 2018-10-29 NOTE — Clinical Social Work Note (Signed)
Patient has had one bed offer: from [facility]. Patient is reluctant to decide if she wants to go to short term rehab but is willing to have [facility] begin [place] with [facility].  Shela Leff MSW,LcSW [phone]

## 2018-10-29 NOTE — Consult Note (Signed)
[facility] Vascular Consult Note  MRN : [id number]  Olivia Rodriguez is a 68 y.o. (21-May-1951) female who presents with chief complaint of  Chief Complaint  Patient presents with  . Diarrhea  . Abnormal labs   History of Present Illness:  The patient is a 68 year old female with a past medical history of obesity, arthritis total left hip arthroplasty on 10/06/2018 admitted to the [facility] emergency department with a diagnosis of colitis.  Patient was noted to have bluish tinted toes and cold lower extremities.  Vascular surgery was consulted by Dr. Jannifer Franklin.  Patient endorses a past medical history of a total left hip replacement on October 06, 2018.  The patient notes this bluish tint to her lower extremity is new.  Patient states while having a pedicure a few days ago she was told by salon staff her toes were "blue".  The patient denies any claudication-like symptoms to the buttocks thigh or calf.  She denies any rest pain or ulcer formation to the bilateral legs.  Patient denies any bilateral foot pain.  Denies fever.  Patient is currently nauseous however most likely due to her colitis.  Current Facility-Administered Medications  Medication Dose Route Frequency Provider Last Rate Last Dose  . 0.9 %  sodium chloride infusion   Intravenous Continuous Lance Coon, MD 50 mL/hr at 10/29/18 [phone]    . acetaminophen (TYLENOL) tablet 325-650 mg  325-650 mg Oral Q6H PRN Sedalia Muta, MD      . heparin injection 5,000 Units  5,000 Units Subcutaneous Q8H Sedalia Muta, MD   5,000 Units at 10/29/18 [phone]  . hydrALAZINE (APRESOLINE) injection 5 mg  5 mg Intravenous Q4H PRN Mayo, Pete Pelt, MD   5 mg at 10/28/18 0805  . methocarbamol (ROBAXIN) tablet 500 mg  500 mg Oral Q6H PRN Sedalia Muta, MD      . morphine 2 MG/ML injection 2 mg  2 mg Intravenous Q3H PRN Mayo, Pete Pelt, MD   2 mg at 10/28/18 1732  . ondansetron (ZOFRAN) tablet 4 mg  4 mg Oral  Q6H PRN Sedalia Muta, MD       Or  . ondansetron [facility]) injection 4 mg  4 mg Intravenous Q6H PRN Sedalia Muta, MD   4 mg at 10/28/18 1731  . sodium chloride flush (NS) 0.9 % injection 3 mL  3 mL Intravenous Q12H Sedalia Muta, MD   3 mL at 10/29/18 0925  . vancomycin (VANCOCIN) 50 mg/mL oral solution 125 mg  125 mg Oral QID Sedalia Muta, MD   125 mg at 10/29/18 7341   Past Medical History:  Diagnosis Date  . Arthritis    Past Surgical History:  Procedure Laterality Date  . CESAREAN SECTION    . REFRACTIVE SURGERY Bilateral 1999  . TONSILLECTOMY    . TOTAL HIP ARTHROPLASTY Left 10/06/2018   Procedure: TOTAL HIP ARTHROPLASTY ANTERIOR APPROACH-LEFT;  Surgeon: Hessie Knows, MD;  Location: [facility] ORS;  Service: Orthopedics;  Laterality: Left;   Social History Social History   Tobacco Use  . Smoking status: Never Smoker  . Smokeless tobacco: Never Used  Substance Use Topics  . Alcohol use: Not Currently    Comment: Occasional wine or mixed drink  . Drug use: Never   Family History History reviewed. No pertinent family history.  Denies family history of peripheral artery disease, venous disease or renal disease  Allergies  Allergen Reactions  . Penicillins Hives and Other (See Comments)    Did  it involve swelling of the face/tongue/throat, SOB, or low BP? No Did it involve sudden or severe rash/hives, skin peeling, or any reaction on the inside of your mouth or nose? Yes Did you need to seek medical attention at a hospital or doctor's office? Yes When did it last happen? Over 60 years ago If all above answers are "NO", may proceed with cephalosporin use.    REVIEW OF SYSTEMS (Negative unless checked)  Constitutional: [] Weight loss  [] Fever  [] Chills Cardiac: [] Chest pain   [] Chest pressure   [] Palpitations   [] Shortness of breath when laying flat   [] Shortness of breath at rest   [] Shortness of breath with exertion. Vascular:  [] Pain in legs with walking   [] Pain in  legs at rest   [] Pain in legs when laying flat   [] Claudication   [] Pain in feet when walking  [] Pain in feet at rest  [] Pain in feet when laying flat   [] History of DVT   [] Phlebitis   [x] Swelling in legs   [] Varicose veins   [] Non-healing ulcers Pulmonary:   [] Uses home oxygen   [] Productive cough   [] Hemoptysis   [] Wheeze  [] COPD   [] Asthma Neurologic:  [] Dizziness  [] Blackouts   [] Seizures   [] History of stroke   [] History of TIA  [] Aphasia   [] Temporary blindness   [] Dysphagia   [] Weakness or numbness in arms   [] Weakness or numbness in legs Musculoskeletal:  [] Arthritis   [] Joint swelling   [] Joint pain   [] Low back pain Hematologic:  [] Easy bruising  [] Easy bleeding   [] Hypercoagulable state   [] Anemic  [] Hepatitis Gastrointestinal:  [] Blood in stool   [] Vomiting blood  [] Gastroesophageal reflux/heartburn   [] Difficulty swallowing. Genitourinary:  [] Chronic kidney disease   [] Difficult urination  [] Frequent urination  [] Burning with urination   [] Blood in urine Skin:  [] Rashes   [] Ulcers   [] Wounds Psychological:  [] History of anxiety   []  History of major depression.  Physical Examination  Vitals:   10/28/18 2238 10/29/18 0500 10/29/18 0523 10/29/18 1157  BP: (!) 159/73  (!) 167/86 (!) 181/96  Pulse: 84  83 77  Resp:   20 18  Temp:   97.9 F (36.6 C)   TempSrc:   Oral   SpO2:   96% 99%  Weight:  116.6 kg    Height:       Body mass index is 47.02 kg/m. Gen:  WD/WN, NAD Head: [place]/AT, No temporalis wasting. Prominent temp pulse not noted. Ear/Nose/Throat: Hearing grossly intact, nares w/o erythema or drainage, oropharynx w/o Erythema/Exudate Eyes: Sclera non-icteric, conjunctiva clear Neck: Trachea midline.  No JVD.  Pulmonary:  Good air movement, respirations not labored, equal bilaterally.  Cardiac: RRR, normal S1, S2. Vascular:  Vessel Right Left  Radial Palpable Palpable  Ulnar Palpable Palpable  Brachial Palpable Palpable  Carotid Palpable, without bruit Palpable,  without bruit  Aorta Not palpable N/A  Femoral Palpable Palpable  Popliteal Palpable Palpable  PT Palpable Palpable  DP Palpable Palpable   Right Lower Extremity: Thigh soft , Calf soft. Non-tender, Mild / Moderate Edema, 2+ DP pulses  Left Lower Extremity: Thigh soft , Calf soft. Non-tender, Mild / Moderate Edema, 2+ DP pulses  Gastrointestinal: soft, non-tender/non-distended. No guarding/reflex.  Musculoskeletal: M/S 5/5 throughout.  Extremities without ischemic changes.  No deformity or atrophy.  Neurologic: Sensation grossly intact in extremities.  Symmetrical.  Speech is fluent. Motor exam as listed above. Psychiatric: Judgment intact, Mood & affect appropriate for pt's clinical situation. Dermatologic: No  rashes or ulcers noted.  No cellulitis or open wounds. Lymph : No Cervical, Axillary, or Inguinal lymphadenopathy.  CBC Lab Results  Component Value Date   WBC 29.7 (H) 10/29/2018   HGB 13.7 10/29/2018   HCT 43.0 10/29/2018   MCV 91.9 10/29/2018   PLT 579 (H) 10/29/2018   BMET    Component Value Date/Time   NA 133 (L) 10/29/2018 0309   K 3.9 10/29/2018 0309   CL 102 10/29/2018 0309   CO2 22 10/29/2018 0309   GLUCOSE 118 (H) 10/29/2018 0309   BUN 59 (H) 10/29/2018 0309   CREATININE 1.00 10/29/2018 0309   CALCIUM 8.0 (L) 10/29/2018 0309   GFRNONAA 58 (L) 10/29/2018 0309   GFRAA >60 10/29/2018 0309   Estimated Creatinine Clearance: 65.2 mL/min (by C-G formula based on SCr of 1 mg/dL).  COAG Lab Results  Component Value Date   INR 1.2 10/27/2018   INR 0.99 09/29/2018   Radiology Ct Abdomen Pelvis Wo Contrast  Result Date: 10/26/2018 CLINICAL DATA:  Diarrhea and leukocytosis. EXAM: CT ABDOMEN AND PELVIS WITHOUT CONTRAST TECHNIQUE: Multidetector CT imaging of the abdomen and pelvis was performed following the standard protocol without IV contrast. COMPARISON:  None. FINDINGS: LOWER CHEST: Trace left pleural effusion with atelectasis. HEPATOBILIARY: The hepatic  contours and density are normal. There is no intra- or extrahepatic biliary dilatation. The gallbladder is normal. PANCREAS: The pancreatic parenchymal contours are normal and there is no ductal dilatation. There is no peripancreatic fluid collection. SPLEEN: Normal. ADRENALS/URINARY TRACT: --Adrenal glands: Normal. --Right kidney/ureter: No hydronephrosis, nephroureterolithiasis, perinephric stranding or solid renal mass. --Left kidney/ureter: No hydronephrosis, nephroureterolithiasis, perinephric stranding or solid renal mass. --Urinary bladder: Normal for degree of distention STOMACH/BOWEL: --Stomach/Duodenum: There is no hiatal hernia or other gastric abnormality. The duodenal course and caliber are normal. --Small bowel: No dilatation or inflammation. --Colon: There is long segment colonic wall thickening with surrounding inflammatory stranding and adjacent free fluid, involving essentially the entire colon. No free intraperitoneal air. No walled-off collection. --Appendix: Normal. VASCULAR/LYMPHATIC: There is aortic atherosclerosis without hemodynamically significant stenosis. No abdominal or pelvic lymphadenopathy. REPRODUCTIVE: Normal uterus. No adnexal mass. MUSCULOSKELETAL. Left total hip arthroplasty. Multilevel facet hypertrophy. No acute abnormality. OTHER: None. IMPRESSION: 1. Long segment colitis involving essentially the entire colon, likely infectious pancolitis. 2. No free intraperitoneal air or abscess formation. 3. Trace left pleural effusion with associated atelectasis. 4.  Aortic atherosclerosis (ICD10-I70.0). Electronically Signed   By: Ulyses Jarred M.D.   On: 10/26/2018 22:19   Dg Abd 1 View  Result Date: 10/28/2018 CLINICAL DATA:  Gaseous abdominal distention. EXAM: ABDOMEN - 1 VIEW COMPARISON:  CT 2 days ago FINDINGS: Gaseous distension of transverse, descending, and sigmoid colon. Mild loss of haustral markings of colon in the left lower quadrant. No small bowel dilatation. No evidence  of free air on supine views. No radiopaque calculi. Left hip arthroplasty. IMPRESSION: Progressive gaseous colonic distention from CT 2 days ago. No obstruction. Colitis on CT is visualized in the lower colon. Electronically Signed   By: Keith Rake M.D.   On: 10/28/2018 20:04   Dg Hip Operative Unilat W Or W/o Pelvis Left  Result Date: 10/06/2018 CLINICAL DATA:  Left total hip replacement.  FT: 36 secs EXAM: OPERATIVE LEFT HIP (WITH PELVIS IF PERFORMED) 3 VIEWS TECHNIQUE: Fluoroscopic spot image(s) were submitted for interpretation post-operatively. COMPARISON:  None. FINDINGS: Initial image demonstrates native hip with significant joint space narrowing and large osteophytes. Subsequent images demonstrate stages of ORIF. There is no  evidence for dislocation. Hardware is intact. IMPRESSION: Status post LEFT hip arthroplasty. Electronically Signed   By: Nolon Nations M.D.   On: 10/06/2018 14:23   Dg Hip Unilat W Or W/o Pelvis 2-3 Views Left  Result Date: 10/06/2018 CLINICAL DATA:  Status post left total hip arthroplasty EXAM: DG HIP (WITH OR WITHOUT PELVIS) 2-3V LEFT COMPARISON:  None. FINDINGS: Expected postsurgical changes from left total hip arthroplasty, with no left hip dislocation and with no evidence of hardware fracture or loosening. No osseous fracture. Skin staples are noted lateral to the left hip. IMPRESSION: Satisfactory immediate postoperative appearance status post left total hip arthroplasty. Electronically Signed   By: Ilona Sorrel M.D.   On: 10/06/2018 15:16   Assessment/Plan The patient is a 68 year old female with a past medical history of obesity, arthritis total left hip arthroplasty on 10/06/2018 admitted to the [facility] emergency department with a diagnosis of colitis. Patient noted to have bluish tint to her toes 1. PAD: Patient with minimal risk factors for peripheral artery disease.  She denies any claudication-like symptoms, rest pain or ulcer  formation to the bilateral legs.  Patient does have 2+ pedal pulses on exam.  Motor/sensory is intact.  At this time, there is no acute vascular compromise to the patient's bilateral lower extremity. 2. Bluish Toes: Most likely due to recent surgery and subsequent decrease in activity. Patients physical exam is notable for edema, skin blanching and bluish tint to toes bilaterally most likely lymphedema. Would recommend compression socks. Will see as an outpatient to work up venous vs lymphatic disease. Patient is in agreement.  3. Colitis: Seen by GI. Followed by primary team.   Discussed with Dr. Mayme Genta, PA-C  10/29/2018 12:12 PM  This note was created with Dragon medical transcription system.  Any error is purely unintentional.

## 2018-10-29 NOTE — Progress Notes (Signed)
X ray KUB with mild progressive colonic ileus  Recommend NPO, vanc enemas, add IV flagyl  Cephas Darby, MD [address]  [facility]  [place], [place] [address]  Main: [phone]  Fax: [phone] Pager: [phone]

## 2018-10-29 NOTE — Progress Notes (Signed)
Patient ID: Olivia Rodriguez, female   DOB: [birth year], 68 y.o.   MRN: [id number]  [facility] PROGRESS NOTE  Mitzie Marlar IRW:[id number] DOB: 02/16/1951 DOA: 10/26/2018 PCP: Patient, No Pcp Per  HPI/Subjective: Patient had a recent hip surgery and was over at rehab and then developed diarrhea.  She was found to have C. difficile colitis.  Diarrhea is still loose and watery.  Still having some abdominal cramping.  She noticed her toes being blue last night.  Objective: Vitals:   10/29/18 0523 10/29/18 1157  BP: (!) 167/86 (!) 181/96  Pulse: 83 77  Resp: 20 18  Temp: 97.9 F (36.6 C)   SpO2: 96% 99%    Filed Weights   10/26/18 1859 10/29/18 0500  Weight: 115.7 kg 116.6 kg    ROS: Review of Systems  Constitutional: Negative for chills and fever.  Eyes: Negative for blurred vision.  Respiratory: Negative for cough and shortness of breath.   Cardiovascular: Negative for chest pain.  Gastrointestinal: Positive for abdominal pain and diarrhea. Negative for constipation, nausea and vomiting.  Genitourinary: Negative for dysuria.  Musculoskeletal: Positive for joint pain.  Neurological: Negative for dizziness and headaches.   Exam: Physical Exam  Constitutional: She is oriented to person, place, and time.  HENT:  Nose: No mucosal edema.  Mouth/Throat: No oropharyngeal exudate or posterior oropharyngeal edema.  Eyes: Pupils are equal, round, and reactive to light. Conjunctivae, EOM and lids are normal.  Neck: No JVD present. Carotid bruit is not present. No edema present. No thyroid mass and no thyromegaly present.  Cardiovascular: S1 normal and S2 normal. Exam reveals no gallop.  No murmur heard. Pulses:      Dorsalis pedis pulses are 2+ on the right side and 2+ on the left side.  Respiratory: No respiratory distress. She has decreased breath sounds in the right lower field and the left lower field. She has no wheezes. She has no rhonchi. She has no rales.  GI: Soft. Bowel  sounds are normal. There is no abdominal tenderness.  Musculoskeletal:     Right ankle: She exhibits swelling.     Left ankle: She exhibits swelling.  Lymphadenopathy:    She has no cervical adenopathy.  Neurological: She is alert and oriented to person, place, and time. No cranial nerve deficit.  Skin: Skin is warm. No rash noted. Nails show no clubbing.  Psychiatric: She has a normal mood and affect.      Data Reviewed: Basic Metabolic Panel: Recent Labs  Lab 10/26/18 1943 10/27/18 0915 10/28/18 0428 10/29/18 0309  NA 132* 133* 134* 133*  K 4.4 3.8 4.5 3.9  CL 97* 102 102 102  CO2 21* 20* 23 22  GLUCOSE 154* 151* 139* 118*  BUN 64* 65* 66* 59*  CREATININE 2.16* 1.76* 1.56* 1.00  CALCIUM 7.7* 7.9* 7.9* 8.0*   Liver Function Tests: Recent Labs  Lab 10/26/18 1943 10/27/18 0915  AST 34 17  ALT 28 24  ALKPHOS 161* 124  BILITOT 1.0 0.4  PROT 5.7* 5.2*  ALBUMIN 2.2* 2.0*   Recent Labs  Lab 10/26/18 1943  LIPASE 18   CBC: Recent Labs  Lab 10/26/18 1943 10/27/18 0915 10/28/18 0428 10/29/18 0309  WBC 43.6* 43.4* 35.1* 29.7*  NEUTROABS 35.7*  --  27.5*  --   HGB 14.4 14.8 14.5 13.7  HCT 42.7 45.4 44.0 43.0  MCV 89.0 90.4 90.2 91.9  PLT 616* 631* 611* 579*    CBG: Recent Labs  Lab 10/27/18 0917 10/29/18 0732  10/29/18 1155  GLUCAP 146* 105* 120*    Recent Results (from the past 240 hour(s))  Blood culture (routine x 2)     Status: None (Preliminary result)   Collection Time: 10/26/18 10:29 PM  Result Value Ref Range Status   Specimen Description BLOOD BLOOD RIGHT WRIST  Final   Special Requests   Final    BOTTLES DRAWN AEROBIC AND ANAEROBIC Blood Culture adequate volume   Culture   Final    NO GROWTH 3 DAYS Performed at [facility], [address], [place], [place] [address]    Report Status PENDING  Incomplete  Blood culture (routine x 2)     Status: None (Preliminary result)   Collection Time: 10/26/18 10:29 PM  Result Value Ref  Range Status   Specimen Description BLOOD BLOOD LEFT FOREARM  Final   Special Requests   Final    BOTTLES DRAWN AEROBIC AND ANAEROBIC Blood Culture adequate volume   Culture   Final    NO GROWTH 3 DAYS Performed at [facility], [address], [place], [place] [address]    Report Status PENDING  Incomplete  Urine Culture     Status: Abnormal   Collection Time: 10/26/18 11:47 PM  Result Value Ref Range Status   Specimen Description   Final    URINE, RANDOM Performed at [facility], [address] [place], [place] [address]    Special Requests   Final    NONE Performed at [facility], [address], [place],  [address]    Culture MULTIPLE SPECIES PRESENT, SUGGEST RECOLLECTION (A)  Final   Report Status 10/28/2018 FINAL  Final  C difficile quick scan w PCR reflex     Status: Abnormal   Collection Time: 10/27/18  9:15 AM  Result Value Ref Range Status   C Diff antigen POSITIVE (A) NEGATIVE Final   C Diff toxin POSITIVE (A) NEGATIVE Final   C Diff interpretation Toxin producing C. difficile detected.  Final    Comment: CRITICAL RESULT CALLED TO, READ BACK BY AND VERIFIED WITH: [place] HAHLE 10/27/18 1730 KLW Performed at [facility], [address] [place] [place],  [address]      Studies: Dg Abd 1 View  Result Date: 10/28/2018 CLINICAL DATA:  Gaseous abdominal distention. EXAM: ABDOMEN - 1 VIEW COMPARISON:  CT 2 days ago FINDINGS: Gaseous distension of transverse, descending, and sigmoid colon. Mild loss of haustral markings of colon in the left lower quadrant. No small bowel dilatation. No evidence of free air on supine views. No radiopaque calculi. Left hip arthroplasty. IMPRESSION: Progressive gaseous colonic distention from CT 2 days ago. No obstruction. Colitis on CT is visualized in the lower colon. Electronically Signed   By: Keith Rake M.D.   On: 10/28/2018 20:04    Scheduled Meds: . fluticasone  2 spray Each Nare  Daily  . heparin  5,000 Units Subcutaneous Q8H  . hydrochlorothiazide  12.5 mg Oral Daily  . loratadine  10 mg Oral QHS  . [START ON 10/30/2018] multivitamin with minerals  1 tablet Oral Daily  . saccharomyces boulardii  250 mg Oral BID  . sodium chloride flush  3 mL Intravenous Q12H  . vancomycin  125 mg Oral QID   Continuous Infusions: . sodium chloride 50 mL/hr at 10/29/18 0621    Assessment/Plan:   1. C. difficile colitis with leukocytosis.  Continue oral vancomycin.  White blood cell count trending better but still very high.  Would like  to see diarrhea form up prior to disposition.  Patient with colonic distention on last x-ray. 2. Acute kidney injury.  Creatinine moving in the right direction. 3. Accelerated hypertension.  Restart hydrochlorothiazide 4. Recent left hip surgery.  Physical therapy evaluation. 5. Bluish toes with pulses that are good on both extremities.  Both feet are cold.  Send off an ANA with reflex panel if positive.  Send off TSH.  Try to warm her feet to see if this is a Raynaud's phenomenon.  Seen by vascular surgery and they recommended TED hose. 6. Obesity with a BMI of 47.02.  Weight loss needed  Code Status:     Code Status Orders  (From admission, onward)         Start     Ordered   10/27/18 0135  Full code  Continuous     10/27/18 0135        Code Status History    Date Active Date Inactive Code Status Order ID Comments User Context   10/06/2018 1550 10/09/2018 2338 Full Code [id number]  Hessie Knows, MD Inpatient     Disposition Plan: Patient interested in going home  Consultants:  Gastroenterology  Vascular surgery  Antibiotics:  Oral vancomycin  Time spent: 28 minutes  [place]

## 2018-10-29 NOTE — Progress Notes (Signed)
PT Cancellation Note  Patient Details Name: Olivia Rodriguez MRN: [id number] DOB: [birth year]   Cancelled Treatment:    Reason Eval/Treat Not Completed: Other (comment)  Pt refuse this am.  No specific reason.  Will attempt again this pm as time allows.   Chesley Noon 10/29/2018, 11:46 AM

## 2018-10-29 NOTE — Progress Notes (Signed)
Olivia Darby, MD [address]  [place]  [place], [place] [address]  Main: [phone]  Fax: [phone] Pager: [phone]   Subjective: Has ongoing abdominal distention but started having nonbloody diarrhea, passing gas.  Reports feeling nauseous.  She is taking oral vancomycin.  Afebrile.  She denies any abdominal pain.  WBC count is improving   Objective: Vital signs in last 24 hours: Vitals:   10/28/18 2238 10/29/18 0500 10/29/18 0523 10/29/18 1157  BP: (!) 159/73  (!) 167/86 (!) 181/96  Pulse: 84  83 77  Resp:   20 18  Temp:   97.9 F (36.6 C)   TempSrc:   Oral   SpO2:   96% 99%  Weight:  116.6 kg    Height:       Weight change:   Intake/Output Summary (Last 24 hours) at 10/29/2018 1802 Last data filed at 10/29/2018 1637 Gross per 24 hour  Intake 839.21 ml  Output 450 ml  Net 389.21 ml     Exam: Heart:: Regular rate and rhythm or S1S2 present Lungs: normal and clear to auscultation Abdomen: Soft, diffusely distended, tympanic to percussion, normoactive bowel sounds, nontender   Lab Results: CBC Latest Ref Rng & Units 10/29/2018 10/28/2018 10/27/2018  WBC 4.0 - 10.5 K/uL 29.7(H) 35.1(H) 43.4(H)  Hemoglobin 12.0 - 15.0 g/dL 13.7 14.5 14.8  Hematocrit 36.0 - 46.0 % 43.0 44.0 45.4  Platelets 150 - 400 K/uL 579(H) 611(H) 631(H)   CMP Latest Ref Rng & Units 10/29/2018 10/28/2018 10/27/2018  Glucose 70 - 99 mg/dL 118(H) 139(H) 151(H)  BUN 8 - 23 mg/dL 59(H) 66(H) 65(H)  Creatinine 0.44 - 1.00 mg/dL 1.00 1.56(H) 1.76(H)  Sodium 135 - 145 mmol/L 133(L) 134(L) 133(L)  Potassium 3.5 - 5.1 mmol/L 3.9 4.5 3.8  Chloride 98 - 111 mmol/L 102 102 102  CO2 22 - 32 mmol/L 22 23 20(L)  Calcium 8.9 - 10.3 mg/dL 8.0(L) 7.9(L) 7.9(L)  Total Protein 6.5 - 8.1 g/dL - - 5.2(L)  Total Bilirubin 0.3 - 1.2 mg/dL - - 0.4  Alkaline Phos 38 - 126 U/L - - 124  AST 15 - 41 U/L - - 17  ALT 0 - 44 U/L - - 24   Micro Results: Recent Results (from the past 240 hour(s))  Blood  culture (routine x 2)     Status: None (Preliminary result)   Collection Time: 10/26/18 10:29 PM  Result Value Ref Range Status   Specimen Description BLOOD BLOOD RIGHT WRIST  Final   Special Requests   Final    BOTTLES DRAWN AEROBIC AND ANAEROBIC Blood Culture adequate volume   Culture   Final    NO GROWTH 3 DAYS Performed at [facility], [address] [place], [place] [address]    Report Status PENDING  Incomplete  Blood culture (routine x 2)     Status: None (Preliminary result)   Collection Time: 10/26/18 10:29 PM  Result Value Ref Range Status   Specimen Description BLOOD BLOOD LEFT FOREARM  Final   Special Requests   Final    BOTTLES DRAWN AEROBIC AND ANAEROBIC Blood Culture adequate volume   Culture   Final    NO GROWTH 3 DAYS Performed at [facility], [address] [place], [place] [address]    Report Status PENDING  Incomplete  Urine Culture     Status: Abnormal   Collection Time: 10/26/18 11:47 PM  Result Value Ref Range Status   Specimen Description  Final    URINE, RANDOM Performed at [facility], [address] [place], [place] [address]    Special Requests   Final    NONE Performed at [facility], [address] [place], [place] [address]    Culture MULTIPLE SPECIES PRESENT, SUGGEST RECOLLECTION (A)  Final   Report Status 10/28/2018 FINAL  Final  C difficile quick scan w PCR reflex     Status: Abnormal   Collection Time: 10/27/18  9:15 AM  Result Value Ref Range Status   C Diff antigen POSITIVE (A) NEGATIVE Final   C Diff toxin POSITIVE (A) NEGATIVE Final   C Diff interpretation Toxin producing C. difficile detected.  Final    Comment: CRITICAL RESULT CALLED TO, READ BACK BY AND VERIFIED WITH: [place] HAHLE 10/27/18 1730 KLW Performed at [facility], [address] [place], [place] [address]    Studies/Results: Dg Abd 1 View  Result Date: 10/28/2018 CLINICAL DATA:  Gaseous abdominal distention.  EXAM: ABDOMEN - 1 VIEW COMPARISON:  CT 2 days ago FINDINGS: Gaseous distension of transverse, descending, and sigmoid colon. Mild loss of haustral markings of colon in the left lower quadrant. No small bowel dilatation. No evidence of free air on supine views. No radiopaque calculi. Left hip arthroplasty. IMPRESSION: Progressive gaseous colonic distention from CT 2 days ago. No obstruction. Colitis on CT is visualized in the lower colon. Electronically Signed   By: Keith Rake M.D.   On: 10/28/2018 20:04   Medications:  I have reviewed the patient's current medications. Prior to Admission:  Medications Prior to Admission  Medication Sig Dispense Refill Last Dose  . Amino Acids-Protein Hydrolys (FEEDING SUPPLEMENT, PRO-STAT SUGAR FREE 64,) LIQD Take 30 mLs by mouth 2 (two) times daily between meals.   10/26/2018 at 0900  . benzonatate (TESSALON) 100 MG capsule Take 100 mg by mouth 3 (three) times daily as needed for cough.   10/26/2018 at 0900  . fluticasone (FLONASE) 50 MCG/ACT nasal spray Place 2 sprays into both nostrils daily.   10/25/2018 at 2100  . hydrochlorothiazide (HYDRODIURIL) 12.5 MG tablet Take 12.5 mg by mouth daily.   10/26/2018 at 0900  . loratadine (CLARITIN) 10 MG tablet Take 10 mg by mouth at bedtime.   10/25/2018 at 2000  . mirabegron ER (MYRBETRIQ) 25 MG TB24 tablet Take 25 mg by mouth daily.   10/25/2018 at 2000  . Multiple Vitamin (MULTIVITAMIN WITH MINERALS) TABS tablet Take 1 tablet by mouth daily.   10/26/2018 at 0900  . saccharomyces boulardii (FLORASTOR) 250 MG capsule Take 250 mg by mouth 2 (two) times daily.   10/26/2018 at 0900  . traMADol (ULTRAM) 50 MG tablet Take 50 mg by mouth 4 (four) times daily.   prn at prn  . vitamin C (ASCORBIC ACID) 250 MG tablet Take 250 mg by mouth 2 (two) times daily.   10/26/2018 at 0900  . zinc sulfate 220 (50 Zn) MG capsule Take 220 mg by mouth daily.   10/26/2018 at 0900  . acetaminophen (TYLENOL) 325 MG tablet Take 1-2 tablets (325-650 mg total) by  mouth every 6 (six) hours as needed for mild pain (pain score 1-3 or temp > 100.5).   prn at prn  . docusate sodium (COLACE) 100 MG capsule Take 1 capsule (100 mg total) by mouth 2 (two) times daily. 10 capsule 0 prn at prn  . enoxaparin (LOVENOX) 40 MG/0.4ML injection Inject 0.4 mLs (40 mg total) into the skin daily for 14 days.  14 Syringe 0   . methocarbamol (ROBAXIN) 500 MG tablet Take 1 tablet (500 mg total) by mouth every 6 (six) hours as needed for muscle spasms. 30 tablet 0 prn at prn   Scheduled: . fluticasone  2 spray Each Nare Daily  . heparin  5,000 Units Subcutaneous Q8H  . hydrochlorothiazide  12.5 mg Oral Daily  . loratadine  10 mg Oral QHS  . sodium chloride flush  3 mL Intravenous Q12H  . vancomycin  125 mg Oral QID  . vancomycin (VANCOCIN) rectal ENEMA  500 mg Rectal Q6H   Continuous: . sodium chloride 50 mL/hr at 10/29/18 1637   CZY:[id number], hydrALAZINE, methocarbamol, morphine injection, ondansetron **OR** ondansetron (ZOFRAN) IV, oxyCODONE Anti-infectives (From admission, onward)   Start     Dose/Rate Route Frequency Ordered Stop   10/29/18 1800  vancomycin (VANCOCIN) 500 mg in sodium chloride irrigation 0.9 % 100 mL ENEMA     500 mg Rectal Every 6 hours 10/29/18 1652     10/27/18 1000  vancomycin (VANCOCIN) 50 mg/mL oral solution 125 mg     125 mg Oral 4 times daily 10/27/18 0135 11/06/18 0959   10/27/18 0145  metroNIDAZOLE (FLAGYL) IVPB 500 mg  Status:  Discontinued     500 mg 100 mL/hr over 60 Minutes Intravenous Every 8 hours 10/27/18 0135 10/27/18 1314   10/27/18 0145  vancomycin (VANCOCIN) 50 mg/mL oral solution 125 mg  Status:  Discontinued     125 mg Oral Every 6 hours 10/27/18 0135 10/27/18 0548   10/27/18 0145  cefTRIAXone (ROCEPHIN) 1 g in sodium chloride 0.9 % 100 mL IVPB  Status:  Discontinued     1 g 200 mL/hr over 30 Minutes Intravenous Every 24 hours 10/27/18 0138 10/28/18 0914   10/27/18 0000  vancomycin (VANCOCIN) 50 mg/mL oral solution 125  mg  Status:  Discontinued     125 mg Oral Every 6 hours 10/26/18 2226 10/27/18 1312   10/26/18 2230  metroNIDAZOLE (FLAGYL) IVPB 500 mg     500 mg 100 mL/hr over 60 Minutes Intravenous  Once 10/26/18 2228 10/27/18 0056     Scheduled Meds: . fluticasone  2 spray Each Nare Daily  . heparin  5,000 Units Subcutaneous Q8H  . hydrochlorothiazide  12.5 mg Oral Daily  . loratadine  10 mg Oral QHS  . sodium chloride flush  3 mL Intravenous Q12H  . vancomycin  125 mg Oral QID  . vancomycin (VANCOCIN) rectal ENEMA  500 mg Rectal Q6H   Continuous Infusions: . sodium chloride 50 mL/hr at 10/29/18 1637   PRN Meds:.acetaminophen, hydrALAZINE, methocarbamol, morphine injection, ondansetron **OR** ondansetron (ZOFRAN) IV, oxyCODONE   Assessment: Active Problems:   Colitis  Probable C. difficile colitis with recent antibiotic use Stool studies confirmed C. difficile colitis WBC trending down but with persistent/progressing abdominal distention She is having bowel movements AKI resolved  Plan: Clear liquid diet only until abdominal distention has improved Continue oral vancomycin 125 mg every 6 hourly Recommend to start vancomycin enema 4 times a day due to persistent/worsening abdominal distention X-ray KUB to assess abdominal distention Monitor CBC with differential Encourage patient to ambulate as tolerated Minimize narcotic use Monitor electrolytes closely and replete as needed  Will follow along with you, Dr. Bonna Gains to cover from tomorrow   LOS: 2 days   Mattelyn Imhoff 10/29/2018, 6:02 PM

## 2018-10-29 NOTE — Progress Notes (Signed)
Patient complaining about rectal tube.Patient adamantly "take it out.  I'm refusing anymore enemas; I can't do this and hold it; I don't want it... can't I just get double doses of the medicine?"  Small amount of yellow, liquid stool expelled with tube removal, along with a lot of gas.  Hospitalist paged, awaiting callback. Barbaraann Faster, RN 8:37 PM 10/29/2018

## 2018-10-29 NOTE — Progress Notes (Signed)
Dr. Brett Albino notified of patient refusal of anymore enemas; acknowledged; "OK, to hold them for now"; Barbaraann Faster, RN 9:04 PM 10/29/2018

## 2018-10-30 LAB — GLUCOSE, CAPILLARY: GLUCOSE-CAPILLARY: 101 mg/dL — AB (ref 70–99)

## 2018-10-30 LAB — ANA W/REFLEX IF POSITIVE: ANA: NEGATIVE

## 2018-10-30 MED ORDER — VANCOMYCIN 50 MG/ML ORAL SOLUTION
500.0000 mg | Freq: Four times a day (QID) | ORAL | Status: DC
  Administered 2018-10-30 – 2018-11-02 (×11): 500 mg via ORAL
  Filled 2018-10-30 (×18): qty 10

## 2018-10-30 NOTE — Progress Notes (Signed)
Patient ID: Olivia Rodriguez, female   DOB: 1951-01-27, 68 y.o.   MRN: [id number]  [facility] PROGRESS NOTE  Olivia Rodriguez SNK:[id number] DOB: September 28, 1950 DOA: 10/26/2018 PCP: Patient, No Pcp Per  HPI/Subjective: Rectal tube placed secondary to diarrhea and patient sitting in a lot of diarrhea.  Patient feels nauseous.  Not much abdominal pain but some cramping.  Objective: Vitals:   10/30/18 0524 10/30/18 1100  BP: (!) 164/74 (!) 159/77  Pulse: 74 77  Resp: 20 19  Temp: 97.9 F (36.6 C) 98.2 F (36.8 C)  SpO2: 98% 99%    Filed Weights   10/26/18 1859 10/29/18 0500 10/30/18 0500  Weight: 115.7 kg 116.6 kg 118.5 kg    ROS: Review of Systems  Constitutional: Negative for chills and fever.  Eyes: Negative for blurred vision.  Respiratory: Negative for cough and shortness of breath.   Cardiovascular: Negative for chest pain.  Gastrointestinal: Positive for abdominal pain, diarrhea and nausea. Negative for constipation and vomiting.  Genitourinary: Negative for dysuria.  Musculoskeletal: Positive for joint pain.  Neurological: Negative for dizziness and headaches.   Exam: Physical Exam  Constitutional: She is oriented to person, place, and time.  HENT:  Nose: No mucosal edema.  Mouth/Throat: No oropharyngeal exudate or posterior oropharyngeal edema.  Eyes: Pupils are equal, round, and reactive to light. Conjunctivae, EOM and lids are normal.  Neck: No JVD present. Carotid bruit is not present. No edema present. No thyroid mass and no thyromegaly present.  Cardiovascular: S1 normal and S2 normal. Exam reveals no gallop.  No murmur heard. Pulses:      Dorsalis pedis pulses are 2+ on the right side and 2+ on the left side.  Respiratory: No respiratory distress. She has decreased breath sounds in the right lower field and the left lower field. She has no wheezes. She has no rhonchi. She has no rales.  GI: Soft. Bowel sounds are normal. She exhibits distension. There is no  abdominal tenderness.  Musculoskeletal:     Right ankle: She exhibits swelling.     Left ankle: She exhibits swelling.  Lymphadenopathy:    She has no cervical adenopathy.  Neurological: She is alert and oriented to person, place, and time. No cranial nerve deficit.  Skin: Skin is warm. No rash noted. Nails show no clubbing.  Psychiatric: She has a normal mood and affect.      Data Reviewed: Basic Metabolic Panel: Recent Labs  Lab 10/26/18 1943 10/27/18 0915 10/28/18 0428 10/29/18 0309  NA 132* 133* 134* 133*  K 4.4 3.8 4.5 3.9  CL 97* 102 102 102  CO2 21* 20* 23 22  GLUCOSE 154* 151* 139* 118*  BUN 64* 65* 66* 59*  CREATININE 2.16* 1.76* 1.56* 1.00  CALCIUM 7.7* 7.9* 7.9* 8.0*   Liver Function Tests: Recent Labs  Lab 10/26/18 1943 10/27/18 0915  AST 34 17  ALT 28 24  ALKPHOS 161* 124  BILITOT 1.0 0.4  PROT 5.7* 5.2*  ALBUMIN 2.2* 2.0*   Recent Labs  Lab 10/26/18 1943  LIPASE 18   CBC: Recent Labs  Lab 10/26/18 1943 10/27/18 0915 10/28/18 0428 10/29/18 0309  WBC 43.6* 43.4* 35.1* 29.7*  NEUTROABS 35.7*  --  27.5*  --   HGB 14.4 14.8 14.5 13.7  HCT 42.7 45.4 44.0 43.0  MCV 89.0 90.4 90.2 91.9  PLT 616* 631* 611* 579*    CBG: Recent Labs  Lab 10/27/18 0917 10/29/18 0732 10/29/18 1155 10/30/18 0741  GLUCAP 146* 105* 120* 101*  Recent Results (from the past 240 hour(s))  Blood culture (routine x 2)     Status: None (Preliminary result)   Collection Time: 10/26/18 10:29 PM  Result Value Ref Range Status   Specimen Description BLOOD BLOOD RIGHT WRIST  Final   Special Requests   Final    BOTTLES DRAWN AEROBIC AND ANAEROBIC Blood Culture adequate volume   Culture   Final    NO GROWTH 4 DAYS Performed at [facility], [address], [place], [place] [address]    Report Status PENDING  Incomplete  Blood culture (routine x 2)     Status: None (Preliminary result)   Collection Time: 10/26/18 10:29 PM  Result Value Ref Range Status    Specimen Description BLOOD BLOOD LEFT FOREARM  Final   Special Requests   Final    BOTTLES DRAWN AEROBIC AND ANAEROBIC Blood Culture adequate volume   Culture   Final    NO GROWTH 4 DAYS Performed at [facility], [address] [place], [place] [address]    Report Status PENDING  Incomplete  Urine Culture     Status: Abnormal   Collection Time: 10/26/18 11:47 PM  Result Value Ref Range Status   Specimen Description   Final    URINE, RANDOM Performed at [facility], [address] [place], [place] [address]    Special Requests   Final    NONE Performed at [facility], [address] [place], [place] [address]    Culture MULTIPLE SPECIES PRESENT, SUGGEST RECOLLECTION (A)  Final   Report Status 10/28/2018 FINAL  Final  C difficile quick scan w PCR reflex     Status: Abnormal   Collection Time: 10/27/18  9:15 AM  Result Value Ref Range Status   C Diff antigen POSITIVE (A) NEGATIVE Final   C Diff toxin POSITIVE (A) NEGATIVE Final   C Diff interpretation Toxin producing C. difficile detected.  Final    Comment: CRITICAL RESULT CALLED TO, READ BACK BY AND VERIFIED WITH: [place] HAHLE 10/27/18 1730 KLW Performed at [facility], [address], [place],  [address]      Studies: Dg Abd 1 View  Result Date: 10/29/2018 CLINICAL DATA:  Abdominal distention and nausea. Recently began having diarrhea and passing gas. EXAM: ABDOMEN - 1 VIEW COMPARISON:  10/28/2018. Abdomen and pelvis CT dated 10/26/2018. FINDINGS: Progressive mucosal edema/wall thickening involving the sigmoid colon. Less pronounced similar changes involving the remainder of the colon. No pneumatosis. The colon is mildly dilated with mild progression. Left lower lobe atelectasis and pleural fluid is again demonstrated, with improvement. Thoracic spine degenerative changes and mild levoconvex scoliosis. Left hip prosthesis. IMPRESSION: 1. Progressive mucosal edema/wall  thickening involving the sigmoid colon, compatible with previous findings of colitis. 2. Mildly progressive colonic ileus. Electronically Signed   By: Claudie Revering M.D.   On: 10/29/2018 18:54   Dg Abd 1 View  Result Date: 10/28/2018 CLINICAL DATA:  Gaseous abdominal distention. EXAM: ABDOMEN - 1 VIEW COMPARISON:  CT 2 days ago FINDINGS: Gaseous distension of transverse, descending, and sigmoid colon. Mild loss of haustral markings of colon in the left lower quadrant. No small bowel dilatation. No evidence of free air on supine views. No radiopaque calculi. Left hip arthroplasty. IMPRESSION: Progressive gaseous colonic distention from CT 2 days ago. No obstruction. Colitis on CT is visualized in the lower colon. Electronically Signed   By: Keith Rake M.D.   On: 10/28/2018 20:04    Scheduled  Meds: . fluticasone  2 spray Each Nare Daily  . heparin  5,000 Units Subcutaneous Q8H  . hydrochlorothiazide  12.5 mg Oral Daily  . loratadine  10 mg Oral QHS  . sodium chloride flush  3 mL Intravenous Q12H  . vancomycin  500 mg Oral QID  . vancomycin (VANCOCIN) rectal ENEMA  500 mg Rectal Q6H   Continuous Infusions: . sodium chloride 50 mL/hr at 10/30/18 0300  . metronidazole 500 mg (10/30/18 0554)    Assessment/Plan:   1. C. difficile colitis with leukocytosis.  Continue oral vancomycin, now increased dose 500 mg every 6.  White blood cell count trending better but still very high.  Rectal tube needed to be placed today.  Patient with colonic distention on last x-ray.  Made n.p.o. by GI. 2. Acute kidney injury.  Creatinine moving in the right direction.  Since n.p.o. I will keep the IV fluids going. 3. Essential hypertension.  Continue hydrochlorothiazide 4. Recent left hip surgery.  Physical therapy evaluation. 5. Bluish toes with pulses that are good on both extremities.  Vascular surgery recommended TED hose. 6. Obesity with a BMI of 47.02.  Weight loss needed  Code Status:     Code  Status Orders  (From admission, onward)         Start     Ordered   10/27/18 0135  Full code  Continuous     10/27/18 0135        Code Status History    Date Active Date Inactive Code Status Order ID Comments User Context   10/06/2018 1550 10/09/2018 2338 Full Code [id number]  Hessie Knows, MD Inpatient     Disposition Plan: Diarrhea will need to slow down prior to disposition.  Consultants:  Gastroenterology  Vascular surgery  Antibiotics:  Oral vancomycin  Time spent: 27 minutes  [place] Camp

## 2018-10-30 NOTE — Progress Notes (Addendum)
Olivia Antigua, MD [address] [place], [place], [place], [address] [address], [place], [place], [place], [address] Phone: [phone]  Fax: [phone]   Subjective:  Vancomycin enema was administered yesterday and patient states she does not want any further vancomycin enemas as she was uncomfortable with them.  She is taking oral vancomycin and receiving IV Flagyl.  Reports 1 large bowel movement today.  Now has a rectal tube in place.  No further bloody stools.  Objective: Exam: Vital signs in last 24 hours: Vitals:   10/29/18 1157 10/29/18 2228 10/30/18 0500 10/30/18 0524  BP: (!) 181/96 (!) 177/82  (!) 164/74  Pulse: 77 76  74  Resp: 18 20  20   Temp:  98.5 F (36.9 C)  97.9 F (36.6 C)  TempSrc:  Oral  Oral  SpO2: 99% 98%  98%  Weight:   118.5 kg   Height:       Weight change: 1.9 kg  Intake/Output Summary (Last 24 hours) at 10/30/2018 1127 Last data filed at 10/30/2018 1047 Gross per 24 hour  Intake 1099.12 ml  Output 200 ml  Net 899.12 ml    General: No acute distress, AAO x3 Abd: Soft, distended, nontender, No HSM Skin: Warm, no rashes Neck: Supple, Trachea midline   Lab Results: Lab Results  Component Value Date   WBC 29.7 (H) 10/29/2018   HGB 13.7 10/29/2018   HCT 43.0 10/29/2018   MCV 91.9 10/29/2018   PLT 579 (H) 10/29/2018   Micro Results: Recent Results (from the past 240 hour(s))  Blood culture (routine x 2)     Status: None (Preliminary result)   Collection Time: 10/26/18 10:29 PM  Result Value Ref Range Status   Specimen Description BLOOD BLOOD RIGHT WRIST  Final   Special Requests   Final    BOTTLES DRAWN AEROBIC AND ANAEROBIC Blood Culture adequate volume   Culture   Final    NO GROWTH 4 DAYS Performed at [facility], [address] [place], [place] [address]    Report Status PENDING  Incomplete  Blood culture (routine x 2)     Status: None (Preliminary result)   Collection Time: 10/26/18 10:29 PM  Result  Value Ref Range Status   Specimen Description BLOOD BLOOD LEFT FOREARM  Final   Special Requests   Final    BOTTLES DRAWN AEROBIC AND ANAEROBIC Blood Culture adequate volume   Culture   Final    NO GROWTH 4 DAYS Performed at [facility], [address], [place], [place] [address]    Report Status PENDING  Incomplete  Urine Culture     Status: Abnormal   Collection Time: 10/26/18 11:47 PM  Result Value Ref Range Status   Specimen Description   Final    URINE, RANDOM Performed at [facility], [address] [place], [place] [address]    Special Requests   Final    NONE Performed at [facility], [address] [place],  [address]    Culture MULTIPLE SPECIES PRESENT, SUGGEST RECOLLECTION (A)  Final   Report Status 10/28/2018 FINAL  Final  C difficile quick scan w PCR reflex     Status: Abnormal   Collection Time: 10/27/18  9:15 AM  Result Value Ref Range Status   C Diff antigen POSITIVE (A) NEGATIVE Final   C Diff toxin POSITIVE (A) NEGATIVE Final   C Diff interpretation Toxin producing C. difficile detected.  Final    Comment: CRITICAL  RESULT CALLED TO, READ BACK BY AND VERIFIED WITH: [place] HAHLE 10/27/18 1730 KLW Performed at [facility], [address] [place], [place] [address]    Studies/Results: Dg Abd 1 View  Result Date: 10/29/2018 CLINICAL DATA:  Abdominal distention and nausea. Recently began having diarrhea and passing gas. EXAM: ABDOMEN - 1 VIEW COMPARISON:  10/28/2018. Abdomen and pelvis CT dated 10/26/2018. FINDINGS: Progressive mucosal edema/wall thickening involving the sigmoid colon. Less pronounced similar changes involving the remainder of the colon. No pneumatosis. The colon is mildly dilated with mild progression. Left lower lobe atelectasis and pleural fluid is again demonstrated, with improvement. Thoracic spine degenerative changes and mild levoconvex scoliosis. Left hip prosthesis. IMPRESSION: 1. Progressive  mucosal edema/wall thickening involving the sigmoid colon, compatible with previous findings of colitis. 2. Mildly progressive colonic ileus. Electronically Signed   By: Claudie Revering M.D.   On: 10/29/2018 18:54   Dg Abd 1 View  Result Date: 10/28/2018 CLINICAL DATA:  Gaseous abdominal distention. EXAM: ABDOMEN - 1 VIEW COMPARISON:  CT 2 days ago FINDINGS: Gaseous distension of transverse, descending, and sigmoid colon. Mild loss of haustral markings of colon in the left lower quadrant. No small bowel dilatation. No evidence of free air on supine views. No radiopaque calculi. Left hip arthroplasty. IMPRESSION: Progressive gaseous colonic distention from CT 2 days ago. No obstruction. Colitis on CT is visualized in the lower colon. Electronically Signed   By: Keith Rake M.D.   On: 10/28/2018 20:04   Medications:  Scheduled Meds: . fluticasone  2 spray Each Nare Daily  . heparin  5,000 Units Subcutaneous Q8H  . hydrochlorothiazide  12.5 mg Oral Daily  . loratadine  10 mg Oral QHS  . sodium chloride flush  3 mL Intravenous Q12H  . vancomycin  125 mg Oral QID  . vancomycin (VANCOCIN) rectal ENEMA  500 mg Rectal Q6H   Continuous Infusions: . sodium chloride 50 mL/hr at 10/30/18 0300  . metronidazole 500 mg (10/30/18 0554)   PRN Meds:.acetaminophen, hydrALAZINE, methocarbamol, morphine injection, ondansetron **OR** ondansetron (ZOFRAN) IV, oxyCODONE   Assessment: Active Problems:   Colitis    Plan: Since patient continues to have distention noted on imaging and clinical exam, and is unwilling to take vancomycin enemas at this time, would recommend increasing vancomycin oral dosing to 500 mg 4 times daily.  As per up-to-date, the higher dosing can be used for C. Difficile.  I have messaged Dr. Earleen Newport in this regard, so he can okay it with the pharmacy, and ordere the new dosing if okay with pharmacy  Continue to monitor daily CBC What is reassuring is that the blood in her stools are  resolved, and her white count has decreased, but is still significantly elevated at this time. Continue close monitoring  If condition worsens, or does not improve, consult ID and surgery.     LOS: 3 days   Olivia Antigua, MD 10/30/2018, 11:27 AM

## 2018-10-30 NOTE — Care Management Important Message (Signed)
Important Message  Patient Details  Name: Olivia Rodriguez MRN: [id number] Date of Birth: May 31, 1951   Medicare Important Message Given:  Yes    Beverly Sessions, RN 10/30/2018, 3:24 PM

## 2018-10-30 NOTE — Clinical Social Work Note (Signed)
Olivia Rodriguez at [facility] informed CSW that prior Josem Kaufmann has been received and is good for 7 days. Shela Leff MSW,LCSW [phone]

## 2018-10-31 LAB — CBC
HCT: 39.8 % (ref 36.0–46.0)
Hemoglobin: 12.7 g/dL (ref 12.0–15.0)
MCH: 29.5 pg (ref 26.0–34.0)
MCHC: 31.9 g/dL (ref 30.0–36.0)
MCV: 92.3 fL (ref 80.0–100.0)
NRBC: 0.3 % — AB (ref 0.0–0.2)
Platelets: 484 10*3/uL — ABNORMAL HIGH (ref 150–400)
RBC: 4.31 MIL/uL (ref 3.87–5.11)
RDW: 12.9 % (ref 11.5–15.5)
WBC: 18.4 10*3/uL — ABNORMAL HIGH (ref 4.0–10.5)

## 2018-10-31 LAB — BASIC METABOLIC PANEL
Anion gap: 7 (ref 5–15)
BUN: 28 mg/dL — ABNORMAL HIGH (ref 8–23)
CHLORIDE: 104 mmol/L (ref 98–111)
CO2: 24 mmol/L (ref 22–32)
Calcium: 7.6 mg/dL — ABNORMAL LOW (ref 8.9–10.3)
Creatinine, Ser: 0.63 mg/dL (ref 0.44–1.00)
GFR calc Af Amer: >60 mL/min (ref >60)
GFR calc non Af Amer: >60 mL/min (ref >60)
Glucose, Bld: 102 mg/dL — ABNORMAL HIGH (ref 70–99)
POTASSIUM: 3.5 mmol/L (ref 3.5–5.1)
Sodium: 135 mmol/L (ref 135–145)

## 2018-10-31 LAB — CULTURE, BLOOD (ROUTINE X 2)
Culture: NO GROWTH
Culture: NO GROWTH
Special Requests: ADEQUATE
Special Requests: ADEQUATE

## 2018-10-31 LAB — MAGNESIUM: Magnesium: 2.4 mg/dL (ref 1.7–2.4)

## 2018-10-31 LAB — GLUCOSE, CAPILLARY: GLUCOSE-CAPILLARY: 95 mg/dL (ref 70–99)

## 2018-10-31 NOTE — Progress Notes (Signed)
Patient ID: Olivia Rodriguez, female   DOB: March 23, 1951, 68 y.o.   MRN: [id number]   [facility] PROGRESS NOTE  Astin Rape BMW:[id number] DOB: 1950-10-23 DOA: 10/26/2018 PCP: Patient, No Pcp Per  HPI/Subjective: Patient feeling a little bit better.  Still with some nausea.  Some abdominal discomfort but better than yesterday.  Not much output out of the rectal tube.  Objective: Vitals:   10/31/18 0455 10/31/18 1129  BP: (!) 155/76 (!) 159/74  Pulse: 67 73  Resp: 17   Temp: 98.2 F (36.8 C) 97.7 F (36.5 C)  SpO2: 96% 100%    Filed Weights   10/29/18 0500 10/30/18 0500 10/31/18 0455  Weight: 116.6 kg 118.5 kg 121.1 kg    ROS: Review of Systems  Constitutional: Negative for chills and fever.  Eyes: Negative for blurred vision.  Respiratory: Negative for cough and shortness of breath.   Cardiovascular: Negative for chest pain.  Gastrointestinal: Positive for abdominal pain, diarrhea and nausea. Negative for constipation and vomiting.  Genitourinary: Negative for dysuria.  Musculoskeletal: Positive for joint pain.  Neurological: Negative for dizziness and headaches.   Exam: Physical Exam  Constitutional: She is oriented to person, place, and time.  HENT:  Nose: No mucosal edema.  Mouth/Throat: No oropharyngeal exudate or posterior oropharyngeal edema.  Eyes: Pupils are equal, round, and reactive to light. Conjunctivae, EOM and lids are normal.  Neck: No JVD present. Carotid bruit is not present. No edema present. No thyroid mass and no thyromegaly present.  Cardiovascular: S1 normal and S2 normal. Exam reveals no gallop.  No murmur heard. Pulses:      Dorsalis pedis pulses are 2+ on the right side and 2+ on the left side.  Respiratory: No respiratory distress. She has decreased breath sounds in the right lower field and the left lower field. She has no wheezes. She has no rhonchi. She has no rales.  GI: Soft. Bowel sounds are normal. She exhibits distension. There is no  abdominal tenderness.  Musculoskeletal:     Right ankle: She exhibits swelling.     Left ankle: She exhibits swelling.  Lymphadenopathy:    She has no cervical adenopathy.  Neurological: She is alert and oriented to person, place, and time. No cranial nerve deficit.  Skin: Skin is warm. No rash noted. Nails show no clubbing.  Toes still little discolored.  Left foot is warm.  Right foot cool to the touch but good pulses.  Psychiatric: She has a normal mood and affect.      Data Reviewed: Basic Metabolic Panel: Recent Labs  Lab 10/26/18 1943 10/27/18 0915 10/28/18 0428 10/29/18 0309 10/31/18 0534  NA 132* 133* 134* 133* 135  K 4.4 3.8 4.5 3.9 3.5  CL 97* 102 102 102 104  CO2 21* 20* 23 22 24   GLUCOSE 154* 151* 139* 118* 102*  BUN 64* 65* 66* 59* 28*  CREATININE 2.16* 1.76* 1.56* 1.00 0.63  CALCIUM 7.7* 7.9* 7.9* 8.0* 7.6*  MG  --   --   --   --  2.4   Liver Function Tests: Recent Labs  Lab 10/26/18 1943 10/27/18 0915  AST 34 17  ALT 28 24  ALKPHOS 161* 124  BILITOT 1.0 0.4  PROT 5.7* 5.2*  ALBUMIN 2.2* 2.0*   Recent Labs  Lab 10/26/18 1943  LIPASE 18   CBC: Recent Labs  Lab 10/26/18 1943 10/27/18 0915 10/28/18 0428 10/29/18 0309 10/31/18 0534  WBC 43.6* 43.4* 35.1* 29.7* 18.4*  NEUTROABS 35.7*  --  27.5*  --   --   HGB 14.4 14.8 14.5 13.7 12.7  HCT 42.7 45.4 44.0 43.0 39.8  MCV 89.0 90.4 90.2 91.9 92.3  PLT 616* 631* 611* 579* 484*    CBG: Recent Labs  Lab 10/27/18 0917 10/29/18 0732 10/29/18 1155 10/30/18 0741 10/31/18 0745  GLUCAP 146* 105* 120* 101* 95    Recent Results (from the past 240 hour(s))  Blood culture (routine x 2)     Status: None   Collection Time: 10/26/18 10:29 PM  Result Value Ref Range Status   Specimen Description BLOOD BLOOD RIGHT WRIST  Final   Special Requests   Final    BOTTLES DRAWN AEROBIC AND ANAEROBIC Blood Culture adequate volume   Culture   Final    NO GROWTH 5 DAYS Performed at [facility],  [address], [place], [place] [address]    Report Status 10/31/2018 FINAL  Final  Blood culture (routine x 2)     Status: None   Collection Time: 10/26/18 10:29 PM  Result Value Ref Range Status   Specimen Description BLOOD BLOOD LEFT FOREARM  Final   Special Requests   Final    BOTTLES DRAWN AEROBIC AND ANAEROBIC Blood Culture adequate volume   Culture   Final    NO GROWTH 5 DAYS Performed at [facility], [address] [place], [place] [address]    Report Status 10/31/2018 FINAL  Final  Urine Culture     Status: Abnormal   Collection Time: 10/26/18 11:47 PM  Result Value Ref Range Status   Specimen Description   Final    URINE, RANDOM Performed at [facility], [address] [place], [place] [address]    Special Requests   Final    NONE Performed at [facility], [address] [place], [place] [address]    Culture MULTIPLE SPECIES PRESENT, SUGGEST RECOLLECTION (A)  Final   Report Status 10/28/2018 FINAL  Final  C difficile quick scan w PCR reflex     Status: Abnormal   Collection Time: 10/27/18  9:15 AM  Result Value Ref Range Status   C Diff antigen POSITIVE (A) NEGATIVE Final   C Diff toxin POSITIVE (A) NEGATIVE Final   C Diff interpretation Toxin producing C. difficile detected.  Final    Comment: CRITICAL RESULT CALLED TO, READ BACK BY AND VERIFIED WITH: [place] HAHLE 10/27/18 1730 KLW Performed at [facility], [address], [place], [place] [address]      Studies: Dg Abd 1 View  Result Date: 10/29/2018 CLINICAL DATA:  Abdominal distention and nausea. Recently began having diarrhea and passing gas. EXAM: ABDOMEN - 1 VIEW COMPARISON:  10/28/2018. Abdomen and pelvis CT dated 10/26/2018. FINDINGS: Progressive mucosal edema/wall thickening involving the sigmoid colon. Less pronounced similar changes involving the remainder of the colon. No pneumatosis. The colon is mildly dilated with mild progression. Left lower lobe  atelectasis and pleural fluid is again demonstrated, with improvement. Thoracic spine degenerative changes and mild levoconvex scoliosis. Left hip prosthesis. IMPRESSION: 1. Progressive mucosal edema/wall thickening involving the sigmoid colon, compatible with previous findings of colitis. 2. Mildly progressive colonic ileus. Electronically Signed   By: Claudie Revering M.D.   On: 10/29/2018 18:54    Scheduled Meds: . fluticasone  2 spray Each Nare Daily  . heparin  5,000 Units Subcutaneous Q8H  . hydrochlorothiazide  12.5 mg Oral Daily  . loratadine  10 mg Oral QHS  . sodium chloride flush  3  mL Intravenous Q12H  . vancomycin  500 mg Oral QID   Continuous Infusions: . sodium chloride 50 mL/hr at 10/30/18 0300  . metronidazole 500 mg (10/31/18 0618)    Assessment/Plan:   1. C. difficile colitis with leukocytosis.  Continue oral vancomycin 500 mg every 6.  Patient also started on metronidazole.  White blood cell count trending better but still elevated at 18.4.  Rectal tube needed to be placed yesterday.  Not much output out of the rectal tube as of this morning.  Continue to watch today.  May be able to get rectal tube out tomorrow.  Gastroenterology made n.p.o. yesterday. 2. Acute kidney injury.  Creatinine moving in the right direction.  Since n.p.o. I will keep the IV fluids going. 3. Essential hypertension.  Continue hydrochlorothiazide 4. Recent left hip surgery.  Physical therapy evaluation. 5. Bluish toes with pulses that are good on both extremities.  Vascular surgery recommended TED hose. 6. Obesity with a BMI of 48.83.  Weight loss needed  Code Status:     Code Status Orders  (From admission, onward)         Start     Ordered   10/27/18 0135  Full code  Continuous     10/27/18 0135        Code Status History    Date Active Date Inactive Code Status Order ID Comments User Context   10/06/2018 1550 10/09/2018 2338 Full Code [id number]  Hessie Knows, MD Inpatient      Disposition Plan: To be determined based on diarrhea volume  Consultants:  Gastroenterology  Vascular surgery  Antibiotics:  Oral vancomycin  IV Flagyl  Time spent: 28 minutes.  Case discussed with nursing staff  [facility]

## 2018-10-31 NOTE — Progress Notes (Signed)
Vonda Antigua, MD [address], [place], [place], [place], [address] [address], [place], [place], [place], [address] Phone: [phone]  Fax: [phone]   Subjective: Patient states she feels much better today.  Diarrhea has improved.  No fever or chills.   Objective: Exam: Vital signs in last 24 hours: Vitals:   10/30/18 0524 10/30/18 1100 10/30/18 1934 10/31/18 0455  BP: (!) 164/74 (!) 159/77 (!) 178/77 (!) 155/76  Pulse: 74 77 75 67  Resp: 20 19 20 17   Temp: 83.1 F (36.6 C) 98.2 F (36.8 C) 97.8 F (36.6 C) 98.2 F (36.8 C)  TempSrc: Oral Oral Oral   SpO2: 98% 99% 98% 96%  Weight:    121.1 kg  Height:       Weight change: 2.6 kg  Intake/Output Summary (Last 24 hours) at 10/31/2018 5176 Last data filed at 10/31/2018 1607 Gross per 24 hour  Intake 1196.41 ml  Output 500 ml  Net 696.41 ml    General: No acute distress, AAO x3 Abd: Soft, NT/ND, No HSM Skin: Warm, no rashes Neck: Supple, Trachea midline   Lab Results: Lab Results  Component Value Date   WBC 18.4 (H) 10/31/2018   HGB 12.7 10/31/2018   HCT 39.8 10/31/2018   MCV 92.3 10/31/2018   PLT 484 (H) 10/31/2018   Micro Results: Recent Results (from the past 240 hour(s))  Blood culture (routine x 2)     Status: None   Collection Time: 10/26/18 10:29 PM  Result Value Ref Range Status   Specimen Description BLOOD BLOOD RIGHT WRIST  Final   Special Requests   Final    BOTTLES DRAWN AEROBIC AND ANAEROBIC Blood Culture adequate volume   Culture   Final    NO GROWTH 5 DAYS Performed at [facility], [address] [place], [place] [address]    Report Status 10/31/2018 FINAL  Final  Blood culture (routine x 2)     Status: None   Collection Time: 10/26/18 10:29 PM  Result Value Ref Range Status   Specimen Description BLOOD BLOOD LEFT FOREARM  Final   Special Requests   Final    BOTTLES DRAWN AEROBIC AND ANAEROBIC Blood Culture adequate volume   Culture   Final    NO GROWTH 5  DAYS Performed at [facility], [address] [place], [place] [address]    Report Status 10/31/2018 FINAL  Final  Urine Culture     Status: Abnormal   Collection Time: 10/26/18 11:47 PM  Result Value Ref Range Status   Specimen Description   Final    URINE, RANDOM Performed at [facility], [address] [place], [place] [address]    Special Requests   Final    NONE Performed at [facility], [address] [place], [place] [address]    Culture MULTIPLE SPECIES PRESENT, SUGGEST RECOLLECTION (A)  Final   Report Status 10/28/2018 FINAL  Final  C difficile quick scan w PCR reflex     Status: Abnormal   Collection Time: 10/27/18  9:15 AM  Result Value Ref Range Status   C Diff antigen POSITIVE (A) NEGATIVE Final   C Diff toxin POSITIVE (A) NEGATIVE Final   C Diff interpretation Toxin producing C. difficile detected.  Final    Comment: CRITICAL RESULT CALLED TO, READ BACK BY AND VERIFIED WITH: [place] HAHLE 10/27/18 1730 KLW Performed at [facility], [address], [place],  [address]    Studies/Results: Dg Abd  1 View  Result Date: 10/29/2018 CLINICAL DATA:  Abdominal distention and nausea. Recently began having diarrhea and passing gas. EXAM: ABDOMEN - 1 VIEW COMPARISON:  10/28/2018. Abdomen and pelvis CT dated 10/26/2018. FINDINGS: Progressive mucosal edema/wall thickening involving the sigmoid colon. Less pronounced similar changes involving the remainder of the colon. No pneumatosis. The colon is mildly dilated with mild progression. Left lower lobe atelectasis and pleural fluid is again demonstrated, with improvement. Thoracic spine degenerative changes and mild levoconvex scoliosis. Left hip prosthesis. IMPRESSION: 1. Progressive mucosal edema/wall thickening involving the sigmoid colon, compatible with previous findings of colitis. 2. Mildly progressive colonic ileus. Electronically Signed   By: Claudie Revering M.D.   On: 10/29/2018  18:54   Medications:  Scheduled Meds: . fluticasone  2 spray Each Nare Daily  . heparin  5,000 Units Subcutaneous Q8H  . hydrochlorothiazide  12.5 mg Oral Daily  . loratadine  10 mg Oral QHS  . sodium chloride flush  3 mL Intravenous Q12H  . vancomycin  500 mg Oral QID  . vancomycin (VANCOCIN) rectal ENEMA  500 mg Rectal Q6H   Continuous Infusions: . sodium chloride 50 mL/hr at 10/30/18 0300  . metronidazole 500 mg (10/31/18 0618)   PRN Meds:.acetaminophen, hydrALAZINE, methocarbamol, morphine injection, ondansetron **OR** ondansetron (ZOFRAN) IV, oxyCODONE   Assessment: Active Problems:   Colitis    Plan: Patient is clinically much improved after increasing dose of vancomycin Her white count has also improved significantly from yesterday Clinical symptoms are thus consistent with improving C. difficile colitis Continue oral vancomycin Continue IV Flagyl  continue the current dose of vancomycin for 7 to 10 days Then continue pulse tapered regimen as per up-to-date "125 mg 4 times daily for 10 to 14 days, then 125 mg twice daily for 7 days, then 125 mg once daily for 7 days, then 125 mg every 2 or 3 days for 2 to 8 weeks"  Primary team can consider infectious disease consult if needed, for determining appropriate discharge regimen for this patient  Consider repeat abdominal x-ray if abdominal distention worsens or does not improve  Patient is currently clinically improving GI service will sign off, please page with any questions or concerns    LOS: 4 days   Vonda Antigua, MD 10/31/2018, 9:21 AM

## 2018-11-01 LAB — CBC
HCT: 39.8 % (ref 36.0–46.0)
Hemoglobin: 13.1 g/dL (ref 12.0–15.0)
MCH: 30 pg (ref 26.0–34.0)
MCHC: 32.9 g/dL (ref 30.0–36.0)
MCV: 91.1 fL (ref 80.0–100.0)
Platelets: 480 10*3/uL — ABNORMAL HIGH (ref 150–400)
RBC: 4.37 MIL/uL (ref 3.87–5.11)
RDW: 12.9 % (ref 11.5–15.5)
WBC: 18.3 10*3/uL — ABNORMAL HIGH (ref 4.0–10.5)
nRBC: 0.2 % (ref 0.0–0.2)

## 2018-11-01 LAB — BASIC METABOLIC PANEL
Anion gap: 8 (ref 5–15)
BUN: 22 mg/dL (ref 8–23)
CALCIUM: 7.9 mg/dL — AB (ref 8.9–10.3)
CO2: 23 mmol/L (ref 22–32)
Chloride: 103 mmol/L (ref 98–111)
Creatinine, Ser: 0.64 mg/dL (ref 0.44–1.00)
GFR calc Af Amer: >60 mL/min (ref >60)
GFR calc non Af Amer: >60 mL/min (ref >60)
GLUCOSE: 108 mg/dL — AB (ref 70–99)
Potassium: 3.4 mmol/L — ABNORMAL LOW (ref 3.5–5.1)
Sodium: 134 mmol/L — ABNORMAL LOW (ref 135–145)

## 2018-11-01 LAB — GLUCOSE, CAPILLARY: Glucose-Capillary: 132 mg/dL — ABNORMAL HIGH (ref 70–99)

## 2018-11-01 NOTE — Progress Notes (Signed)
Patient ID: Olivia Rodriguez, female   DOB: 1950/10/16, 68 y.o.   MRN: [id number]   [facility] PROGRESS NOTE  Olivia Rodriguez QVZ:[id number] DOB: [birth year] DOA: 10/26/2018 PCP: Patient, No Pcp Per  HPI/Subjective: Patient with worsening abdominal cramping and diarrhea blowouts after IV Flagyl given.  Patient overall feels better.  Tolerated liquid diet this morning.  Objective: Vitals:   11/01/18 0532 11/01/18 1232  BP: (!) 159/69 (!) 157/67  Pulse: 70 75  Resp: 20 20  Temp: 98.1 F (36.7 C) 98.2 F (36.8 C)  SpO2: 99% 97%    Filed Weights   10/29/18 0500 10/30/18 0500 10/31/18 0455  Weight: 116.6 kg 118.5 kg 121.1 kg    ROS: Review of Systems  Constitutional: Negative for chills and fever.  Eyes: Negative for blurred vision.  Respiratory: Negative for cough and shortness of breath.   Cardiovascular: Negative for chest pain.  Gastrointestinal: Positive for abdominal pain, diarrhea and nausea. Negative for constipation and vomiting.  Genitourinary: Negative for dysuria.  Musculoskeletal: Positive for joint pain.  Neurological: Negative for dizziness and headaches.   Exam: Physical Exam  Constitutional: She is oriented to person, place, and time.  HENT:  Nose: No mucosal edema.  Mouth/Throat: No oropharyngeal exudate or posterior oropharyngeal edema.  Eyes: Pupils are equal, round, and reactive to light. Conjunctivae, EOM and lids are normal.  Neck: No JVD present. Carotid bruit is not present. No edema present. No thyroid mass and no thyromegaly present.  Cardiovascular: S1 normal and S2 normal. Exam reveals no gallop.  No murmur heard. Pulses:      Dorsalis pedis pulses are 2+ on the right side and 2+ on the left side.  Respiratory: No respiratory distress. She has decreased breath sounds in the right lower field and the left lower field. She has no wheezes. She has no rhonchi. She has no rales.  GI: Soft. Bowel sounds are normal. She exhibits distension. There is no  abdominal tenderness.  Musculoskeletal:     Right ankle: She exhibits swelling.     Left ankle: She exhibits swelling.  Lymphadenopathy:    She has no cervical adenopathy.  Neurological: She is alert and oriented to person, place, and time. No cranial nerve deficit.  Skin: Skin is warm. No rash noted. Nails show no clubbing.  Toes still little discolored.  Left foot is warm.  Right foot now warm to the touch.  Pulses good both lower extremities.  Psychiatric: She has a normal mood and affect.      Data Reviewed: Basic Metabolic Panel: Recent Labs  Lab 10/27/18 0915 10/28/18 0428 10/29/18 0309 10/31/18 0534 11/01/18 0455  NA 133* 134* 133* 135 134*  K 3.8 4.5 3.9 3.5 3.4*  CL 102 102 102 104 103  CO2 20* 23 22 24 23   GLUCOSE 151* 139* 118* 102* 108*  BUN 65* 66* 59* 28* 22  CREATININE 1.76* 1.56* 1.00 0.63 0.64  CALCIUM 7.9* 7.9* 8.0* 7.6* 7.9*  MG  --   --   --  2.4  --    Liver Function Tests: Recent Labs  Lab 10/26/18 1943 10/27/18 0915  AST 34 17  ALT 28 24  ALKPHOS 161* 124  BILITOT 1.0 0.4  PROT 5.7* 5.2*  ALBUMIN 2.2* 2.0*   Recent Labs  Lab 10/26/18 1943  LIPASE 18   CBC: Recent Labs  Lab 10/26/18 1943 10/27/18 0915 10/28/18 0428 10/29/18 0309 10/31/18 0534 11/01/18 0455  WBC 43.6* 43.4* 35.1* 29.7* 18.4* 18.3*  NEUTROABS 35.7*  --  27.5*  --   --   --   HGB 14.4 14.8 14.5 13.7 12.7 13.1  HCT 42.7 45.4 44.0 43.0 39.8 39.8  MCV 89.0 90.4 90.2 91.9 92.3 91.1  PLT 616* 631* 611* 579* 484* 480*    CBG: Recent Labs  Lab 10/29/18 0732 10/29/18 1155 10/30/18 0741 10/31/18 0745 11/01/18 0915  GLUCAP 105* 120* 101* 95 132*    Recent Results (from the past 240 hour(s))  Blood culture (routine x 2)     Status: None   Collection Time: 10/26/18 10:29 PM  Result Value Ref Range Status   Specimen Description BLOOD BLOOD RIGHT WRIST  Final   Special Requests   Final    BOTTLES DRAWN AEROBIC AND ANAEROBIC Blood Culture adequate volume    Culture   Final    NO GROWTH 5 DAYS Performed at [facility], [address], [place], [place] [address]    Report Status 10/31/2018 FINAL  Final  Blood culture (routine x 2)     Status: None   Collection Time: 10/26/18 10:29 PM  Result Value Ref Range Status   Specimen Description BLOOD BLOOD LEFT FOREARM  Final   Special Requests   Final    BOTTLES DRAWN AEROBIC AND ANAEROBIC Blood Culture adequate volume   Culture   Final    NO GROWTH 5 DAYS Performed at [facility], [address], [place], [place] [address]    Report Status 10/31/2018 FINAL  Final  Urine Culture     Status: Abnormal   Collection Time: 10/26/18 11:47 PM  Result Value Ref Range Status   Specimen Description   Final    URINE, RANDOM Performed at [facility], [address], [place], [place] [address]    Special Requests   Final    NONE Performed at [facility], [address] [place], [place] [address]    Culture MULTIPLE SPECIES PRESENT, SUGGEST RECOLLECTION (A)  Final   Report Status 10/28/2018 FINAL  Final  C difficile quick scan w PCR reflex     Status: Abnormal   Collection Time: 10/27/18  9:15 AM  Result Value Ref Range Status   C Diff antigen POSITIVE (A) NEGATIVE Final   C Diff toxin POSITIVE (A) NEGATIVE Final   C Diff interpretation Toxin producing C. difficile detected.  Final    Comment: CRITICAL RESULT CALLED TO, READ BACK BY AND VERIFIED WITH: [place] HAHLE 10/27/18 1730 KLW Performed at [facility], [address] [place], [place] [address]       Scheduled Meds: . fluticasone  2 spray Each Nare Daily  . heparin  5,000 Units Subcutaneous Q8H  . hydrochlorothiazide  12.5 mg Oral Daily  . loratadine  10 mg Oral QHS  . sodium chloride flush  3 mL Intravenous Q12H  . vancomycin  500 mg Oral QID   Continuous Infusions: . sodium chloride 50 mL/hr at 11/01/18 0630    Assessment/Plan:   1. C. difficile colitis with leukocytosis.   Continue oral vancomycin 500 mg every 6.  Discontinue metronidazole with worsening abdominal cramping and diarrhea during each dose.  White blood cell count trending better but still elevated at 18.3.  Still with rectal tube output.  GI signed off.  Will advance diet 2. Acute kidney injury.  Creatinine normalized.  Since I advance the diet I will discontinue IV fluids today. 3. Essential hypertension.  Continue hydrochlorothiazide 4. Recent left hip surgery.  Physical therapy evaluation. 5. Bluish toes with  pulses that are good on both extremities.  Vascular surgery recommended TED hose. 6. Obesity with a BMI of 48.83.  Weight loss needed  Code Status:     Code Status Orders  (From admission, onward)         Start     Ordered   10/27/18 0135  Full code  Continuous     10/27/18 0135        Code Status History    Date Active Date Inactive Code Status Order ID Comments User Context   10/06/2018 1550 10/09/2018 2338 Full Code [id number]  Hessie Knows, MD Inpatient     Disposition Plan: To be determined based on diarrhea volume  Consultants:  Gastroenterology  Vascular surgery  Antibiotics:  Oral vancomycin  Time spent: 26 minutes.  Case discussed with nursing staff  [facility]

## 2018-11-02 LAB — CBC
HCT: 41.6 % (ref 36.0–46.0)
Hemoglobin: 13.6 g/dL (ref 12.0–15.0)
MCH: 30 pg (ref 26.0–34.0)
MCHC: 32.7 g/dL (ref 30.0–36.0)
MCV: 91.8 fL (ref 80.0–100.0)
Platelets: 487 10*3/uL — ABNORMAL HIGH (ref 150–400)
RBC: 4.53 MIL/uL (ref 3.87–5.11)
RDW: 12.9 % (ref 11.5–15.5)
WBC: 19.5 10*3/uL — ABNORMAL HIGH (ref 4.0–10.5)
nRBC: 0.1 % (ref 0.0–0.2)

## 2018-11-02 LAB — GLUCOSE, CAPILLARY: Glucose-Capillary: 111 mg/dL — ABNORMAL HIGH (ref 70–99)

## 2018-11-02 MED ORDER — FIDAXOMICIN 200 MG PO TABS: 200.0000 mg | ORAL_TABLET | Freq: Two times a day (BID) | ORAL | 0 refills | Status: DC

## 2018-11-02 MED ORDER — FIDAXOMICIN 200 MG PO TABS
200.0000 mg | ORAL_TABLET | Freq: Two times a day (BID) | ORAL | Status: DC
  Administered 2018-11-02 – 2018-11-05 (×7): 200 mg via ORAL
  Filled 2018-11-02 (×8): qty 1

## 2018-11-02 MED ORDER — TRAZODONE HCL 50 MG PO TABS
50.0000 mg | ORAL_TABLET | Freq: Every day | ORAL | Status: DC
  Administered 2018-11-02: 50 mg via ORAL
  Filled 2018-11-02 (×3): qty 1

## 2018-11-02 MED FILL — DIFICID 200 MG TABLET: 200 | 8 days supply | Qty: 16 | Fill #0

## 2018-11-02 NOTE — Progress Notes (Signed)
Patient ID: Olivia Rodriguez, female   DOB: 22-May-1951, 68 y.o.   MRN: [id number]   [employer] PROGRESS NOTE  Sema Stangler MPN:[id number] DOB: 08/30/1950 DOA: 10/26/2018 PCP: Patient, No Pcp Per  HPI/Subjective: Patient still with nausea.  Some abdominal cramping.  Still with the rectal tube.  Still feeling weak but wants to go home.  Did not sleep last night.  Objective: Vitals:   11/02/18 0609 11/02/18 1300  BP: (!) 178/93 (!) 166/77  Pulse: 74 77  Resp: 18 19  Temp: 98.7 F (37.1 C) 98.3 F (36.8 C)  SpO2: 97% 98%    Filed Weights   10/29/18 0500 10/30/18 0500 10/31/18 0455  Weight: 116.6 kg 118.5 kg 121.1 kg    ROS: Review of Systems  Constitutional: Negative for chills and fever.  Eyes: Negative for blurred vision.  Respiratory: Negative for cough and shortness of breath.   Cardiovascular: Negative for chest pain.  Gastrointestinal: Positive for abdominal pain, diarrhea and nausea. Negative for constipation and vomiting.  Genitourinary: Negative for dysuria.  Musculoskeletal: Positive for joint pain.  Neurological: Negative for dizziness and headaches.   Exam: Physical Exam  Constitutional: She is oriented to person, place, and time.  HENT:  Nose: No mucosal edema.  Mouth/Throat: No oropharyngeal exudate or posterior oropharyngeal edema.  Eyes: Pupils are equal, round, and reactive to light. Conjunctivae, EOM and lids are normal.  Neck: No JVD present. Carotid bruit is not present. No edema present. No thyroid mass and no thyromegaly present.  Cardiovascular: S1 normal and S2 normal. Exam reveals no gallop.  No murmur heard. Pulses:      Dorsalis pedis pulses are 2+ on the right side and 2+ on the left side.  Respiratory: No respiratory distress. She has decreased breath sounds in the right lower field and the left lower field. She has no wheezes. She has no rhonchi. She has no rales.  GI: Soft. Bowel sounds are normal. She exhibits distension. There is no  abdominal tenderness.  Musculoskeletal:     Right ankle: She exhibits swelling.     Left ankle: She exhibits swelling.  Lymphadenopathy:    She has no cervical adenopathy.  Neurological: She is alert and oriented to person, place, and time. No cranial nerve deficit.  Skin: Skin is warm. No rash noted. Nails show no clubbing.  Toes still little discolored.  Left foot is warm.  Right foot now warm to the touch.  Pulses good both lower extremities.  Psychiatric: She has a normal mood and affect.      Data Reviewed: Basic Metabolic Panel: Recent Labs  Lab 10/27/18 0915 10/28/18 0428 10/29/18 0309 10/31/18 0534 11/01/18 0455  NA 133* 134* 133* 135 134*  K 3.8 4.5 3.9 3.5 3.4*  CL 102 102 102 104 103  CO2 20* 23 22 24 23   GLUCOSE 151* 139* 118* 102* 108*  BUN 65* 66* 59* 28* 22  CREATININE 1.76* 1.56* 1.00 0.63 0.64  CALCIUM 7.9* 7.9* 8.0* 7.6* 7.9*  MG  --   --   --  2.4  --    Liver Function Tests: Recent Labs  Lab 10/26/18 1943 10/27/18 0915  AST 34 17  ALT 28 24  ALKPHOS 161* 124  BILITOT 1.0 0.4  PROT 5.7* 5.2*  ALBUMIN 2.2* 2.0*   Recent Labs  Lab 10/26/18 1943  LIPASE 18   CBC: Recent Labs  Lab 10/26/18 1943  10/28/18 0428 10/29/18 0309 10/31/18 0534 11/01/18 0455 11/02/18 0501  WBC 43.6*   < >  35.1* 29.7* 18.4* 18.3* 19.5*  NEUTROABS 35.7*  --  27.5*  --   --   --   --   HGB 14.4   < > 14.5 13.7 12.7 13.1 13.6  HCT 42.7   < > 44.0 43.0 39.8 39.8 41.6  MCV 89.0   < > 90.2 91.9 92.3 91.1 91.8  PLT 616*   < > 611* 579* 484* 480* 487*   < > = values in this interval not displayed.    CBG: Recent Labs  Lab 10/29/18 1155 10/30/18 0741 10/31/18 0745 11/01/18 0915 11/02/18 0752  GLUCAP 120* 101* 95 132* 111*    Recent Results (from the past 240 hour(s))  Blood culture (routine x 2)     Status: None   Collection Time: 10/26/18 10:29 PM  Result Value Ref Range Status   Specimen Description BLOOD BLOOD RIGHT WRIST  Final   Special Requests    Final    BOTTLES DRAWN AEROBIC AND ANAEROBIC Blood Culture adequate volume   Culture   Final    NO GROWTH 5 DAYS Performed at [facility], [address], [place], [place] [address]    Report Status 10/31/2018 FINAL  Final  Blood culture (routine x 2)     Status: None   Collection Time: 10/26/18 10:29 PM  Result Value Ref Range Status   Specimen Description BLOOD BLOOD LEFT FOREARM  Final   Special Requests   Final    BOTTLES DRAWN AEROBIC AND ANAEROBIC Blood Culture adequate volume   Culture   Final    NO GROWTH 5 DAYS Performed at [facility], [address] [place], [place] [address]    Report Status 10/31/2018 FINAL  Final  Urine Culture     Status: Abnormal   Collection Time: 10/26/18 11:47 PM  Result Value Ref Range Status   Specimen Description   Final    URINE, RANDOM Performed at [facility], [address], [place], [place] [address]    Special Requests   Final    NONE Performed at [facility], [address] [place], [place] [address]    Culture MULTIPLE SPECIES PRESENT, SUGGEST RECOLLECTION (A)  Final   Report Status 10/28/2018 FINAL  Final  C difficile quick scan w PCR reflex     Status: Abnormal   Collection Time: 10/27/18  9:15 AM  Result Value Ref Range Status   C Diff antigen POSITIVE (A) NEGATIVE Final   C Diff toxin POSITIVE (A) NEGATIVE Final   C Diff interpretation Toxin producing C. difficile detected.  Final    Comment: CRITICAL RESULT CALLED TO, READ BACK BY AND VERIFIED WITH: [place] HAHLE 10/27/18 1730 KLW Performed at [facility], [address] [place], [place] [address]       Scheduled Meds: . fidaxomicin  200 mg Oral BID  . fluticasone  2 spray Each Nare Daily  . heparin  5,000 Units Subcutaneous Q8H  . hydrochlorothiazide  12.5 mg Oral Daily  . loratadine  10 mg Oral QHS  . sodium chloride flush  3 mL Intravenous Q12H   Continuous Infusions:   Assessment/Plan:   1. C.  difficile colitis with leukocytosis.  Case discussed with pharmacy and I will switch over to Dificid 200 mg twice a day.  Pharmacy called the outpatient pharmacy and they will mail the prescription to the patient's house.  Patient will need a 10-day course.  Prior to getting out of the hospital, the patient will have  to have the rectal tube out and have the stool form up a little bit. 2. Acute kidney injury.  Creatinine normalized.  IV fluids stopped. 3. Essential hypertension.  Continue hydrochlorothiazide 4. Recent left hip surgery.  Physical therapy evaluation. 5. Bluish toes with pulses that are good on both extremities.  Vascular surgery recommended TED hose. 6. Obesity with a BMI of 48.83.  Weight loss needed  Code Status:     Code Status Orders  (From admission, onward)         Start     Ordered   10/27/18 0135  Full code  Continuous     10/27/18 0135        Code Status History    Date Active Date Inactive Code Status Order ID Comments User Context   10/06/2018 1550 10/09/2018 2338 Full Code [id number]  Hessie Knows, MD Inpatient     Disposition Plan: To be determined based on diarrhea volume  Consultants:  Gastroenterology  Vascular surgery  Antibiotics:  Oral Dificid  Time spent: 28 minutes  [place]

## 2018-11-02 NOTE — Progress Notes (Signed)
Physical Therapy Treatment Patient Details Name: Toneka Fullen MRN: [id number] DOB: 1950-09-15 Today's Date: 11/02/2018    History of Present Illness 68 y.o. female s/p L THA anterior approach 10/06/18 was readmitted from SNF for c-diff symptoms, now referred to PT.  Pt is expecting to go home.  PMHx:  atherosclerosis, OA, L THA with direct anterior approach,     PT Comments    Patient received in bed, refuses to get out of bed at this time. Agrees to bed exercises only. Reports she is feeling bad having constant diarrhea. Patient performing LE strengthening exercises independently on right, requires min assist on left due to weakness. Patient will benefit from continued skilled PT to address her weakness and decreased functional mobility.       Follow Up Recommendations  SNF;Home health PT(depending on progress)     Equipment Recommendations  Rolling walker with 5" wheels;3in1 (PT)    Recommendations for Other Services OT consult     Precautions / Restrictions Precautions Precautions: Fall Restrictions Weight Bearing Restrictions: Yes LLE Weight Bearing: Partial weight bearing LLE Partial Weight Bearing Percentage or Pounds: 50% Other Position/Activity Restrictions: per her admission from 2/14    Mobility  Bed Mobility               General bed mobility comments: patient refused OOB activity  Transfers                 General transfer comment: patient refused  Ambulation/Gait             General Gait Details: patient refused   Stairs             Wheelchair Mobility    Modified Rankin (Stroke Patients Only)       Balance                                            Cognition Arousal/Alertness: Awake/alert Behavior During Therapy: WFL for tasks assessed/performed Overall Cognitive Status: Within Functional Limits for tasks assessed                                        Exercises Other  Exercises Other Exercises: B LE strengthening exercises: ap, heel slides, hip abd/add, saq, SLR x 10 reps bilaterally. Requires min assist on left.      General Comments        Pertinent Vitals/Pain Pain Assessment: No/denies pain    Home Living                      Prior Function            PT Goals (current goals can now be found in the care plan section) Acute Rehab PT Goals Patient Stated Goal: to go home PT Goal Formulation: With patient Time For Goal Achievement: 11/11/18 Potential to Achieve Goals: Fair Progress towards PT goals: Not progressing toward goals - comment(patient unwilling to walk due to constant diarreah)    Frequency    Min 2X/week      PT Plan Current plan remains appropriate    Co-evaluation              AM-PAC PT "6 Clicks" Mobility   Outcome Measure  Help needed turning from your back to your side  while in a flat bed without using bedrails?: A Lot Help needed moving from lying on your back to sitting on the side of a flat bed without using bedrails?: A Lot Help needed moving to and from a bed to a chair (including a wheelchair)?: A Lot Help needed standing up from a chair using your arms (e.g., wheelchair or bedside chair)?: A Lot Help needed to walk in hospital room?: A Lot Help needed climbing 3-5 steps with a railing? : A Lot 6 Click Score: 12    End of Session   Activity Tolerance: Patient tolerated treatment well Patient left: in bed;with bed alarm set Nurse Communication: Mobility status PT Visit Diagnosis: Muscle weakness (generalized) (M62.81);Difficulty in walking, not elsewhere classified (R26.2) Pain - Right/Left: Left Pain - part of body: Hip     Time: 6770-3403 PT Time Calculation (min) (ACUTE ONLY): 17 min  Charges:  $Therapeutic Exercise: 8-22 mins                     Jess Toney, PT, GCS 11/02/18,3:01 PM

## 2018-11-02 NOTE — Clinical Social Work Note (Signed)
Patient now refusing to go to skilled nursing facility. Arrangements for home will be made. Shela Leff MSW,LCSW [phone]

## 2018-11-02 NOTE — Progress Notes (Signed)
Pharmacy - Antimicrobial Stewardship  Fidaxomixin (Dificid)  Cost: $8.95 for #16 tabs  Discussed with patient and Dr. Leslye Peer.  Discharge date uncertain but medication must be mailed from [facility] in [place].  Medication is not available at [facility]. Due to having PO Box, patients states medication can be sent to her daughter's home.  Address given to specialty pharmacy.    Doreene Eland, PharmD, BCPS.   Work Cell: [phone] 11/02/2018 2:45 PM

## 2018-11-02 NOTE — Care Management (Signed)
Notified patient is declining SNF and will return home with home health services.  Full RNCM to follow

## 2018-11-03 LAB — GLUCOSE, CAPILLARY: GLUCOSE-CAPILLARY: 100 mg/dL — AB (ref 70–99)

## 2018-11-03 LAB — CBC
HCT: 42 % (ref 36.0–46.0)
Hemoglobin: 13.6 g/dL (ref 12.0–15.0)
MCH: 29.5 pg (ref 26.0–34.0)
MCHC: 32.4 g/dL (ref 30.0–36.0)
MCV: 91.1 fL (ref 80.0–100.0)
NRBC: 0.1 % (ref 0.0–0.2)
Platelets: 443 10*3/uL — ABNORMAL HIGH (ref 150–400)
RBC: 4.61 MIL/uL (ref 3.87–5.11)
RDW: 13 % (ref 11.5–15.5)
WBC: 15.9 10*3/uL — ABNORMAL HIGH (ref 4.0–10.5)

## 2018-11-03 LAB — BASIC METABOLIC PANEL
Anion gap: 6 (ref 5–15)
BUN: 15 mg/dL (ref 8–23)
CO2: 27 mmol/L (ref 22–32)
Calcium: 7.6 mg/dL — ABNORMAL LOW (ref 8.9–10.3)
Chloride: 101 mmol/L (ref 98–111)
Creatinine, Ser: 0.66 mg/dL (ref 0.44–1.00)
GFR calc Af Amer: >60 mL/min (ref >60)
GFR calc non Af Amer: >60 mL/min (ref >60)
Glucose, Bld: 110 mg/dL — ABNORMAL HIGH (ref 70–99)
Potassium: 3.4 mmol/L — ABNORMAL LOW (ref 3.5–5.1)
Sodium: 134 mmol/L — ABNORMAL LOW (ref 135–145)

## 2018-11-03 MED ORDER — POTASSIUM CHLORIDE CRYS ER 20 MEQ PO TBCR
40.0000 meq | EXTENDED_RELEASE_TABLET | Freq: Once | ORAL | Status: AC
  Administered 2018-11-03: 40 meq via ORAL
  Filled 2018-11-03: qty 2

## 2018-11-03 MED ORDER — POTASSIUM CHLORIDE CRYS ER 20 MEQ PO TBCR: 20.0000 meq | EXTENDED_RELEASE_TABLET | Freq: Once | ORAL | Status: DC

## 2018-11-03 NOTE — Evaluation (Signed)
Occupational Therapy Evaluation Patient Details Name: Olivia Rodriguez MRN: [id number] DOB: 15-Jan-1951 Today's Date: 11/03/2018    History of Present Illness 68 y.o. female s/p L THA anterior approach 10/06/18 was readmitted from SNF for c-diff symptoms.  Pt is expecting to go home.  PMHx:  atherosclerosis, OA, L THA with direct anterior approach.   Clinical Impression   Pt seen for OT evaluation this date. Prior to hospital admission, pt was at [facility] for recent L THA and pt reports doing well in rehab, ambulating with RW short distances, and looking forward to return home prior to this admission.  Pt lives alone but reports daughter will be coming to stay with her after discharge. Pt reports plan is to return home. Currently pt demonstrates impairments in LUE edema (primarily in dorsal aspect of L hand, improvement noted with retrograde massage and repositioning), pain in abdomen (stomach ache, cramping), generalized weakness, and impaired balance and activity tolerance requiring mod-max assist for LB ADL and mod assist for functional mobility. Pt instructed in retrograde massage and positioning of BUE to minimize pitting edema. Pt would benefit from skilled OT to address noted impairments and functional limitations (see below for any additional details) in order to maximize safety and independence while minimizing falls risk and caregiver burden. Upon hospital discharge, recommend pt discharge to .    Follow Up Recommendations  SNF    Equipment Recommendations  Tub/shower bench(TBD)    Recommendations for Other Services       Precautions / Restrictions Precautions Precautions: Fall;Other (comment) Precaution Comments: enteric, rectal tube Restrictions Weight Bearing Restrictions: Yes LLE Weight Bearing: Partial weight bearing LLE Partial Weight Bearing Percentage or Pounds: 50% Other Position/Activity Restrictions: per her admission from 2/14      Mobility Bed Mobility                General bed mobility comments: deferred, pt reporting 7/10 abdominal pain/cramping  Transfers                 General transfer comment: deferred, pt reporting 7/10 abdominal pain/cramping    Balance                                           ADL either performed or assessed with clinical judgement   ADL Overall ADL's : Needs assistance/impaired Eating/Feeding: Independent;Sitting Eating/Feeding Details (indicate cue type and reason): pt reports nausea and difficulty eating lately 2/2 abdominal cramps and nausea but physically able to do independently Grooming: Sitting;Independent   Upper Body Bathing: Sitting;Minimal assistance   Lower Body Bathing: Sit to/from stand;Moderate assistance;Maximal assistance   Upper Body Dressing : Sitting;Minimal assistance   Lower Body Dressing: Sit to/from stand;Moderate assistance;Maximal assistance   Toilet Transfer: RW;Ambulation;BSC;Moderate assistance                   Vision Baseline Vision/History: Wears glasses Wears Glasses: Reading only Patient Visual Report: No change from baseline       Perception     Praxis      Pertinent Vitals/Pain Pain Assessment: 0-10 Pain Score: 7  Pain Location: abdomen - stomach cramps/aches Pain Descriptors / Indicators: Cramping Pain Intervention(s): Limited activity within patient's tolerance;Monitored during session;Repositioned     Hand Dominance Right   Extremity/Trunk Assessment Upper Extremity Assessment Upper Extremity Assessment: Generalized weakness;LUE deficits/detail(grossly 4-/5 bilaterally) LUE Deficits / Details: dorsal aspect of L hand and fingers  with +1-2 pitting edema with noted improvement after retrograde massage and repositioning of LUE on pillow LUE Sensation: WNL LUE Coordination: WNL   Lower Extremity Assessment Lower Extremity Assessment: Generalized weakness;LLE deficits/detail LLE Deficits / Details: s/p THA  LLE Sensation:  WNL LLE Coordination: WNL   Cervical / Trunk Assessment Cervical / Trunk Assessment: Normal   Communication Communication Communication: No difficulties   Cognition Arousal/Alertness: Awake/alert Behavior During Therapy: WFL for tasks assessed/performed Overall Cognitive Status: Within Functional Limits for tasks assessed                                     General Comments  dorsal aspect of L hand edematous, noted improvement with retrograde massade and repositioned on pillow at end of session    Exercises Other Exercises Other Exercises: pt instructed in self retrograde massage for L hand to minimize swelling and instructed to elevate BUE to help decrease swelling   Shoulder Instructions      Home Living Family/patient expects to be discharged to:: Private residence Living Arrangements: Alone Available Help at Discharge: Family;Available 24 hours/day(daughter plans to stay with pt while recovering) Type of Home: House Home Access: Stairs to enter [employer] of Steps: 2 steps with R railing from back (where she parks the car) and 4-5 steps with L railing from front Entrance Stairs-Rails: Left;Right Home Layout: One level     Bathroom Shower/Tub: Teacher, early years/pre: Standard Bathroom Accessibility: Yes   Home Equipment: Cane - single point;Grab bars - tub/shower;Adaptive equipment Adaptive Equipment: Reacher;Sock aid;Long-handled shoe horn;Long-handled sponge        Prior Functioning/Environment Level of Independence: Needs assistance  Gait / Transfers Assistance Needed: SNF admission for short gait distances with RW ADL's / Homemaking Assistance Needed: pt was in SNF for STR with staff assisting all dressing and bathing            OT Problem List: Decreased strength;Decreased range of motion;Decreased knowledge of use of DME or AE;Increased edema;Obesity;Decreased activity tolerance;Impaired UE functional  use;Pain;Impaired balance (sitting and/or standing)      OT Treatment/Interventions: Self-care/ADL training;Therapeutic exercise;Patient/family education;Therapeutic activities;DME and/or AE instruction    OT Goals(Current goals can be found in the care plan section) Acute Rehab OT Goals Patient Stated Goal: to go home OT Goal Formulation: With patient Time For Goal Achievement: 11/17/18 Potential to Achieve Goals: Good ADL Goals Pt Will Perform Lower Body Dressing: with min assist;sit to/from stand;with adaptive equipment Pt Will Transfer to Toilet: with min assist;bedside commode;ambulating(LRAD for amb, maintaining 50% LLE PWBing) Additional ADL Goal #1: Pt will demonstrate proper technique for self retrograde massage for L hand to minimize edema and demonstrate proper positioning to elevate LUE.  OT Frequency: Min 1X/week   Barriers to D/C: Inaccessible home environment          Co-evaluation              AM-PAC OT "6 Clicks" Daily Activity     Outcome Measure Help from another person eating meals?: None Help from another person taking care of personal grooming?: None Help from another person toileting, which includes using toliet, bedpan, or urinal?: A Lot Help from another person bathing (including washing, rinsing, drying)?: A Lot Help from another person to put on and taking off regular upper body clothing?: A Little Help from another person to put on and taking off regular lower body clothing?: A Lot 6  Click Score: 17   End of Session    Activity Tolerance: Patient tolerated treatment well Patient left: in bed;with call bell/phone within reach;with bed alarm set  OT Visit Diagnosis: Unsteadiness on feet (R26.81);Other abnormalities of gait and mobility (R26.89);History of falling (Z91.81);Pain;Muscle weakness (generalized) (M62.81) Pain - Right/Left: Left(and mostly abdomen) Pain - part of body: Hip                Time: 1440-1507 OT Time Calculation (min): 27  min Charges:  OT General Charges $OT Visit: 1 Visit OT Evaluation $OT Eval Moderate Complexity: 1 Mod OT Treatments $Therapeutic Activity: 8-22 mins  Jeni Salles, MPH, MS, OTR/L ascom [phone] 11/03/18, 3:46 PM

## 2018-11-03 NOTE — Progress Notes (Signed)
Patient ID: Olivia Rodriguez, female   DOB: April 12, 1951, 68 y.o.   MRN: [id number]   [employer] PROGRESS NOTE  Ellean Firman LNL:[id number] DOB: [birth year] DOA: 10/26/2018 PCP: Patient, No Pcp Per  HPI/Subjective: Patient endorses diarrhea is less frequent  Some abdominal cramping.  Still with the rectal tube.  Still feeling weak, could not work with physical therapy today Objective: Vitals:   11/03/18 1252 11/03/18 2017  BP: (!) 144/64 (!) 148/65  Pulse: 74 78  Resp:  20  Temp: 98.8 F (37.1 C) 98.3 F (36.8 C)  SpO2: 96% 98%    Filed Weights   10/30/18 0500 10/31/18 0455 11/03/18 0500  Weight: 118.5 kg 121.1 kg 122.4 kg    ROS: Review of Systems  Constitutional: Negative for chills and fever.  Eyes: Negative for blurred vision.  Respiratory: Negative for cough and shortness of breath.   Cardiovascular: Negative for chest pain.  Gastrointestinal: Positive for abdominal pain and diarrhea. Negative for constipation, nausea and vomiting.  Genitourinary: Negative for dysuria.  Musculoskeletal: Positive for joint pain.  Neurological: Negative for dizziness and headaches.   Exam: Physical Exam  Constitutional: She is oriented to person, place, and time.  HENT:  Nose: No mucosal edema.  Mouth/Throat: No oropharyngeal exudate or posterior oropharyngeal edema.  Eyes: Pupils are equal, round, and reactive to light. Conjunctivae, EOM and lids are normal.  Neck: No JVD present. Carotid bruit is not present. No edema present. No thyroid mass and no thyromegaly present.  Cardiovascular: S1 normal and S2 normal. Exam reveals no gallop.  No murmur heard. Pulses:      Dorsalis pedis pulses are 2+ on the right side and 2+ on the left side.  Respiratory: No respiratory distress. She has decreased breath sounds in the right lower field and the left lower field. She has no wheezes. She has no rhonchi. She has no rales.  GI: Soft. Bowel sounds are normal. She exhibits distension. There is no  abdominal tenderness.  Musculoskeletal:     Right ankle: She exhibits swelling.     Left ankle: She exhibits swelling.  Lymphadenopathy:    She has no cervical adenopathy.  Neurological: She is alert and oriented to person, place, and time. No cranial nerve deficit.  Skin: Skin is warm. No rash noted. Nails show no clubbing.  Toes still little discolored.  Left foot is warm.  Right foot now warm to the touch.  Pulses good both lower extremities.  Psychiatric: She has a normal mood and affect.      Data Reviewed: Basic Metabolic Panel: Recent Labs  Lab 10/28/18 0428 10/29/18 0309 10/31/18 0534 11/01/18 0455 11/03/18 0419  NA 134* 133* 135 134* 134*  K 4.5 3.9 3.5 3.4* 3.4*  CL 102 102 104 103 101  CO2 23 22 24 23 27   GLUCOSE 139* 118* 102* 108* 110*  BUN 66* 59* 28* 22 15  CREATININE 1.56* 1.00 0.63 0.64 0.66  CALCIUM 7.9* 8.0* 7.6* 7.9* 7.6*  MG  --   --  2.4  --   --    Liver Function Tests: No results for input(s): AST, ALT, ALKPHOS, BILITOT, PROT, ALBUMIN in the last 168 hours. No results for input(s): LIPASE, AMYLASE in the last 168 hours. CBC: Recent Labs  Lab 10/28/18 0428 10/29/18 0309 10/31/18 0534 11/01/18 0455 11/02/18 0501 11/03/18 0419  WBC 35.1* 29.7* 18.4* 18.3* 19.5* 15.9*  NEUTROABS 27.5*  --   --   --   --   --  HGB 14.5 13.7 12.7 13.1 13.6 13.6  HCT 44.0 43.0 39.8 39.8 41.6 42.0  MCV 90.2 91.9 92.3 91.1 91.8 91.1  PLT 611* 579* 484* 480* 487* 443*    CBG: Recent Labs  Lab 10/30/18 0741 10/31/18 0745 11/01/18 0915 11/02/18 0752 11/03/18 0757  GLUCAP 101* 95 132* 111* 100*    Recent Results (from the past 240 hour(s))  Blood culture (routine x 2)     Status: None   Collection Time: 10/26/18 10:29 PM  Result Value Ref Range Status   Specimen Description BLOOD BLOOD RIGHT WRIST  Final   Special Requests   Final    BOTTLES DRAWN AEROBIC AND ANAEROBIC Blood Culture adequate volume   Culture   Final    NO GROWTH 5 DAYS Performed at  [facility], [address], [place], [place] [address]    Report Status 10/31/2018 FINAL  Final  Blood culture (routine x 2)     Status: None   Collection Time: 10/26/18 10:29 PM  Result Value Ref Range Status   Specimen Description BLOOD BLOOD LEFT FOREARM  Final   Special Requests   Final    BOTTLES DRAWN AEROBIC AND ANAEROBIC Blood Culture adequate volume   Culture   Final    NO GROWTH 5 DAYS Performed at [facility], [address], [place], [place] [address]    Report Status 10/31/2018 FINAL  Final  Urine Culture     Status: Abnormal   Collection Time: 10/26/18 11:47 PM  Result Value Ref Range Status   Specimen Description   Final    URINE, RANDOM Performed at [facility], [address], [place], [place] [address]    Special Requests   Final    NONE Performed at [facility], [address] [place], [place] [address]    Culture MULTIPLE SPECIES PRESENT, SUGGEST RECOLLECTION (A)  Final   Report Status 10/28/2018 FINAL  Final  C difficile quick scan w PCR reflex     Status: Abnormal   Collection Time: 10/27/18  9:15 AM  Result Value Ref Range Status   C Diff antigen POSITIVE (A) NEGATIVE Final   C Diff toxin POSITIVE (A) NEGATIVE Final   C Diff interpretation Toxin producing C. difficile detected.  Final    Comment: CRITICAL RESULT CALLED TO, READ BACK BY AND VERIFIED WITH: [place] HAHLE 10/27/18 1730 KLW Performed at [facility], [address] [place], [place] [address]       Scheduled Meds: . fidaxomicin  200 mg Oral BID  . fluticasone  2 spray Each Nare Daily  . heparin  5,000 Units Subcutaneous Q8H  . hydrochlorothiazide  12.5 mg Oral Daily  . loratadine  10 mg Oral QHS  . sodium chloride flush  3 mL Intravenous Q12H  . traZODone  50 mg Oral QHS   Continuous Infusions:   Assessment/Plan:   1. C. difficile colitis with leukocytosis.  Patient's diarrhea is improving after switching her over to   Dificid 200 mg twice a day from 11/02/2018 pharmacy called the outpatient pharmacy and they have already mailed  the prescription to the patient's daughters house.  Patient will need a total 10-day course.  Prior to getting out of the hospital, the patient will have to have the rectal tube out and have the stool form up a little bit. 2. Acute kidney injury.  Creatinine normalized.  IV fluids stopped. 3. Essential hypertension.  Continue hydrochlorothiazide 4. Recent left hip surgery.  Physical  therapy evaluation pending patient could not work with them today as she is weak 5. Bluish toes with pulses that are good on both extremities.  Vascular surgery recommended TED hose.  Outpatient follow-up 6. Obesity with a BMI of 48.83.  Weight loss needed  Code Status:     Code Status Orders  (From admission, onward)         Start     Ordered   10/27/18 0135  Full code  Continuous     10/27/18 0135        Code Status History    Date Active Date Inactive Code Status Order ID Comments User Context   10/06/2018 1550 10/09/2018 2338 Full Code [id number]  Hessie Knows, MD Inpatient     Disposition Plan: To be determined based on diarrhea volume  Consultants:  Gastroenterology  Vascular surgery  Antibiotics:  Oral Dificid  Time spent: 30 minutes  [place]

## 2018-11-03 NOTE — Progress Notes (Signed)
RN has attempted multiple times to encourage pt to ambulate and sit in the chair, pt is constantly refusing to get out of the bed to at least sit in the chair and states "I feel too weak right now." RN will continue to educate and encourage pt on the importance of ambulation.   Darnette Lampron [employer]

## 2018-11-03 NOTE — Progress Notes (Signed)
PT Cancellation Note  Patient Details Name: Olivia Rodriguez MRN: [id number] DOB: [birth year]   Cancelled Treatment:    Reason Eval/Treat Not Completed: Fatigue/lethargy limiting ability to participate. Treatment attempted; pt refuses noting fatigue post OT and notes PT was very difficult the day prior. Pt wishes to defer until tomorrow. Re attempt tomorrow.    Larae Grooms, PTA 11/03/2018, 4:38 PM

## 2018-11-04 LAB — BASIC METABOLIC PANEL
Anion gap: 7 (ref 5–15)
BUN: 14 mg/dL (ref 8–23)
CALCIUM: 7.8 mg/dL — AB (ref 8.9–10.3)
CO2: 29 mmol/L (ref 22–32)
Chloride: 97 mmol/L — ABNORMAL LOW (ref 98–111)
Creatinine, Ser: 0.63 mg/dL (ref 0.44–1.00)
GFR calc Af Amer: >60 mL/min (ref >60)
GFR calc non Af Amer: >60 mL/min (ref >60)
Glucose, Bld: 119 mg/dL — ABNORMAL HIGH (ref 70–99)
Potassium: 3.6 mmol/L (ref 3.5–5.1)
Sodium: 133 mmol/L — ABNORMAL LOW (ref 135–145)

## 2018-11-04 LAB — MAGNESIUM: Magnesium: 2.1 mg/dL (ref 1.7–2.4)

## 2018-11-04 LAB — GLUCOSE, CAPILLARY: Glucose-Capillary: 101 mg/dL — ABNORMAL HIGH (ref 70–99)

## 2018-11-04 MED ORDER — PROMETHAZINE HCL 25 MG/ML IJ SOLN
12.5000 mg | Freq: Three times a day (TID) | INTRAMUSCULAR | Status: DC | PRN
  Administered 2018-11-05: 12.5 mg via INTRAVENOUS
  Filled 2018-11-04: qty 1

## 2018-11-04 NOTE — Progress Notes (Signed)
PT Cancellation Note  Patient Details Name: Olivia Rodriguez MRN: [id number] DOB: 1950-12-17   Cancelled Treatment:    Reason Eval/Treat Not Completed: Medical issues which prohibited therapy. Treatment attempted; pt refusing due to N/V and "wooziness" in the head. Pt notes she has taken medication for N/V, but continues to not feel well. Nursing wishes pt to attempt up to chair; agreeable to attempt again at a later time when pt symptoms improve.    Larae Grooms, PTA 11/04/2018, 12:24 PM

## 2018-11-04 NOTE — Progress Notes (Signed)
Patient ID: Olivia Rodriguez, female   DOB: [birth year], 68 y.o.   MRN: [id number]   [employer] PROGRESS NOTE  [name] VXY:[id number] DOB: [birth year] DOA: 10/26/2018 PCP: Patient, No Pcp Per  HPI/Subjective: Patient endorses diarrhea is getting better still has abdominal bloating and cramping and extremely weak.  Reporting nausea .could not work with physical therapy.  Rectal tube dislodged last night  objective: Vitals:   11/04/18 1002 11/04/18 1429  BP: 139/69 (!) 141/69  Pulse: 82 72  Resp:  18  Temp:  97.9 F (36.6 C)  SpO2:  96%    Filed Weights   10/30/18 0500 10/31/18 0455 11/03/18 0500  Weight: 118.5 kg 121.1 kg 122.4 kg    ROS: Review of Systems  Constitutional: Negative for chills and fever.  Eyes: Negative for blurred vision.  Respiratory: Negative for cough and shortness of breath.   Cardiovascular: Negative for chest pain.  Gastrointestinal: Positive for abdominal pain and diarrhea. Negative for constipation, nausea and vomiting.  Genitourinary: Negative for dysuria.  Musculoskeletal: Positive for joint pain.  Neurological: Negative for dizziness and headaches.   Exam: Physical Exam  Constitutional: She is oriented to person, place, and time.  HENT:  Nose: No mucosal edema.  Mouth/Throat: No oropharyngeal exudate or posterior oropharyngeal edema.  Eyes: Pupils are equal, round, and reactive to light. Conjunctivae, EOM and lids are normal.  Neck: No JVD present. Carotid bruit is not present. No edema present. No thyroid mass and no thyromegaly present.  Cardiovascular: S1 normal and S2 normal. Exam reveals no gallop.  No murmur heard. Pulses:      Dorsalis pedis pulses are 2+ on the right side and 2+ on the left side.  Respiratory: No respiratory distress. She has decreased breath sounds in the right lower field and the left lower field. She has no wheezes. She has no rhonchi. She has no rales.  GI: Soft. Bowel sounds are normal. She exhibits distension.  There is no abdominal tenderness.  Musculoskeletal:     Right ankle: She exhibits swelling.     Left ankle: She exhibits swelling.  Lymphadenopathy:    She has no cervical adenopathy.  Neurological: She is alert and oriented to person, place, and time. No cranial nerve deficit.  Skin: Skin is warm. No rash noted. Nails show no clubbing.  Toes still little discolored.  Left foot is warm.  Right foot now warm to the touch.  Pulses good both lower extremities.  Psychiatric: She has a normal mood and affect.      Data Reviewed: Basic Metabolic Panel: Recent Labs  Lab 10/29/18 0309 10/31/18 0534 11/01/18 0455 11/03/18 0419 11/04/18 0442  NA 133* 135 134* 134* 133*  K 3.9 3.5 3.4* 3.4* 3.6  CL 102 104 103 101 97*  CO2 22 24 23 27 29   GLUCOSE 118* 102* 108* 110* 119*  BUN 59* 28* 22 15 14   CREATININE 1.00 0.63 0.64 0.66 0.63  CALCIUM 8.0* 7.6* 7.9* 7.6* 7.8*  MG  --  2.4  --   --  2.1   Liver Function Tests: No results for input(s): AST, ALT, ALKPHOS, BILITOT, PROT, ALBUMIN in the last 168 hours. No results for input(s): LIPASE, AMYLASE in the last 168 hours. CBC: Recent Labs  Lab 10/29/18 0309 10/31/18 0534 11/01/18 0455 11/02/18 0501 11/03/18 0419  WBC 29.7* 18.4* 18.3* 19.5* 15.9*  HGB 13.7 12.7 13.1 13.6 13.6  HCT 43.0 39.8 39.8 41.6 42.0  MCV 91.9 92.3 91.1 91.8 91.1  PLT 579*  484* 480* 487* 443*    CBG: Recent Labs  Lab 10/31/18 0745 11/01/18 0915 11/02/18 0752 11/03/18 0757 11/04/18 0740  GLUCAP 95 132* 111* 100* 101*    Recent Results (from the past 240 hour(s))  Blood culture (routine x 2)     Status: None   Collection Time: 10/26/18 10:29 PM  Result Value Ref Range Status   Specimen Description BLOOD BLOOD RIGHT WRIST  Final   Special Requests   Final    BOTTLES DRAWN AEROBIC AND ANAEROBIC Blood Culture adequate volume   Culture   Final    NO GROWTH 5 DAYS Performed at [facility], [address], [place], [place] [address]     Report Status 10/31/2018 FINAL  Final  Blood culture (routine x 2)     Status: None   Collection Time: 10/26/18 10:29 PM  Result Value Ref Range Status   Specimen Description BLOOD BLOOD LEFT FOREARM  Final   Special Requests   Final    BOTTLES DRAWN AEROBIC AND ANAEROBIC Blood Culture adequate volume   Culture   Final    NO GROWTH 5 DAYS Performed at [facility], [address], [place], [place] [address]    Report Status 10/31/2018 FINAL  Final  Urine Culture     Status: Abnormal   Collection Time: 10/26/18 11:47 PM  Result Value Ref Range Status   Specimen Description   Final    URINE, RANDOM Performed at [facility], [address], [place], [place] [address]    Special Requests   Final    NONE Performed at [facility], [address] [place], [place] [address]    Culture MULTIPLE SPECIES PRESENT, SUGGEST RECOLLECTION (A)  Final   Report Status 10/28/2018 FINAL  Final  C difficile quick scan w PCR reflex     Status: Abnormal   Collection Time: 10/27/18  9:15 AM  Result Value Ref Range Status   C Diff antigen POSITIVE (A) NEGATIVE Final   C Diff toxin POSITIVE (A) NEGATIVE Final   C Diff interpretation Toxin producing C. difficile detected.  Final    Comment: CRITICAL RESULT CALLED TO, READ BACK BY AND VERIFIED WITH: [place] HAHLE 10/27/18 1730 KLW Performed at [facility], [address] [place],  [address]       Scheduled Meds: . fidaxomicin  200 mg Oral BID  . fluticasone  2 spray Each Nare Daily  . heparin  5,000 Units Subcutaneous Q8H  . hydrochlorothiazide  12.5 mg Oral Daily  . loratadine  10 mg Oral QHS  . sodium chloride flush  3 mL Intravenous Q12H  . traZODone  50 mg Oral QHS   Continuous Infusions:   Assessment/Plan:   1. C. difficile colitis with leukocytosis.  Patient's diarrhea is improving after switching her over to  Dificid 200 mg twice a day from 11/02/2018 pharmacy called the outpatient  pharmacy and they have already mailed  the prescription to the patient's daughters house.  Patient will need a total 10-day course.  Antiemetics as needed for nausea 2. Acute kidney injury.  Creatinine normalized.  IV fluids stopped. 3. Essential hypertension.  Continue hydrochlorothiazide 4. Recent left hip surgery.  Physical therapy evaluation pending patient could not work with them today as she is weak 5. Bluish toes with pulses that are good on both extremities.  Vascular surgery recommended TED hose.  Outpatient follow-up 6. Obesity with a BMI of 48.83.  Weight loss needed 7. Generalized weakness  PT consult placed patient is refusing to work with physical therapy but wants to go home  Code Status:     Code Status Orders  (From admission, onward)         Start     Ordered   10/27/18 0135  Full code  Continuous     10/27/18 0135        Code Status History    Date Active Date Inactive Code Status Order ID Comments User Context   10/06/2018 1550 10/09/2018 2338 Full Code [id number]  Hessie Knows, MD Inpatient     Disposition Plan: To be determined based on diarrhea volume  Consultants:  Gastroenterology  Vascular surgery  Antibiotics:  Oral Dificid  Time spent: 36 minutes  [place]

## 2018-11-05 ENCOUNTER — Inpatient Hospital Stay (HOSPITAL_COMMUNITY)
Admission: EM | Admit: 2018-11-05 | Discharge: 2018-11-13 | Disposition: A | Discharge status: Skilled Nursing Facility | Payer: Medicare Other | Source: Home / Self Care | Attending: Internal Medicine | Admitting: Internal Medicine

## 2018-11-05 ENCOUNTER — Encounter: Payer: Self-pay | Admitting: Internal Medicine

## 2018-11-05 DIAGNOSIS — A0472 Enterocolitis due to Clostridium difficile, not specified as recurrent: Secondary | ICD-10-CM

## 2018-11-05 DIAGNOSIS — A498 Other bacterial infections of unspecified site: Secondary | ICD-10-CM | POA: Diagnosis present

## 2018-11-05 DIAGNOSIS — R531 Weakness: Secondary | ICD-10-CM

## 2018-11-05 LAB — DIFFERENTIAL
Abs Immature Granulocytes: 0.29 10*3/uL — ABNORMAL HIGH (ref 0.00–0.07)
Basophils Absolute: 0.1 10*3/uL (ref 0.0–0.1)
Basophils Relative: 0 %
EOS PCT: 0 %
Eosinophils Absolute: 0 10*3/uL (ref 0.0–0.5)
Immature Granulocytes: 2 %
Lymphocytes Relative: 8 %
Lymphs Abs: 1.6 10*3/uL (ref 0.7–4.0)
Monocytes Absolute: 1.4 10*3/uL — ABNORMAL HIGH (ref 0.1–1.0)
Monocytes Relative: 7 %
Neutro Abs: 16.4 10*3/uL — ABNORMAL HIGH (ref 1.7–7.7)
Neutrophils Relative %: 83 %

## 2018-11-05 LAB — BASIC METABOLIC PANEL
Anion gap: 8 (ref 5–15)
BUN: 14 mg/dL (ref 8–23)
CO2: 30 mmol/L (ref 22–32)
Calcium: 8 mg/dL — ABNORMAL LOW (ref 8.9–10.3)
Chloride: 97 mmol/L — ABNORMAL LOW (ref 98–111)
Creatinine, Ser: 0.83 mg/dL (ref 0.44–1.00)
GFR calc Af Amer: >60 mL/min (ref >60)
GFR calc non Af Amer: >60 mL/min (ref >60)
Glucose, Bld: 132 mg/dL — ABNORMAL HIGH (ref 70–99)
POTASSIUM: 3.2 mmol/L — AB (ref 3.5–5.1)
Sodium: 135 mmol/L (ref 135–145)

## 2018-11-05 LAB — HEPATIC FUNCTION PANEL
ALT: 16 U/L (ref 0–44)
AST: 29 U/L (ref 15–41)
Albumin: 2.4 g/dL — ABNORMAL LOW (ref 3.5–5.0)
Alkaline Phosphatase: 90 U/L (ref 38–126)
Bilirubin, Direct: 0.1 mg/dL (ref 0.0–0.2)
Indirect Bilirubin: 0.4 mg/dL (ref 0.3–0.9)
Total Bilirubin: 0.5 mg/dL (ref 0.3–1.2)
Total Protein: 5 g/dL — ABNORMAL LOW (ref 6.5–8.1)

## 2018-11-05 LAB — CBC
HCT: 38.9 % (ref 36.0–46.0)
Hemoglobin: 12.2 g/dL (ref 12.0–15.0)
MCH: 29.7 pg (ref 26.0–34.0)
MCHC: 31.4 g/dL (ref 30.0–36.0)
MCV: 94.6 fL (ref 80.0–100.0)
Platelets: 296 10*3/uL (ref 150–400)
RBC: 4.11 MIL/uL (ref 3.87–5.11)
RDW: 13.3 % (ref 11.5–15.5)
WBC: 19.7 10*3/uL — ABNORMAL HIGH (ref 4.0–10.5)
nRBC: 0 % (ref 0.0–0.2)

## 2018-11-05 LAB — GLUCOSE, CAPILLARY
Glucose-Capillary: 87 mg/dL (ref 70–99)
Glucose-Capillary: 155 mg/dL — ABNORMAL HIGH (ref 70–99)

## 2018-11-05 LAB — TROPONIN I: Troponin I: 0.03 ng/mL (ref <0.03)

## 2018-11-05 LAB — LIPASE, BLOOD: Lipase: 34 U/L (ref 11–51)

## 2018-11-05 MED ORDER — PRO-STAT SUGAR FREE PO LIQD
30.0000 mL | Freq: Two times a day (BID) | ORAL | Status: DC
  Administered 2018-11-06 – 2018-11-08 (×4): 30 mL via ORAL

## 2018-11-05 MED ORDER — SODIUM CHLORIDE 0.9 % IV BOLUS
500.0000 mL | Freq: Once | INTRAVENOUS | Status: AC
  Administered 2018-11-05: 500 mL via INTRAVENOUS

## 2018-11-05 MED ORDER — HYDROCHLOROTHIAZIDE 12.5 MG PO TABS: 12.5000 mg | ORAL_TABLET | Freq: Every day | ORAL | 0 refills | Status: DC

## 2018-11-05 MED ORDER — MIRABEGRON ER 25 MG PO TB24
25.0000 mg | ORAL_TABLET | Freq: Every day | ORAL | Status: DC
  Administered 2018-11-06: 25 mg via ORAL
  Filled 2018-11-05 (×9): qty 1

## 2018-11-05 MED ORDER — ONDANSETRON HCL 4 MG PO TABS: 4.0000 mg | ORAL_TABLET | Freq: Four times a day (QID) | ORAL | 0 refills | Status: DC | PRN

## 2018-11-05 MED ORDER — LACTATED RINGERS IV BOLUS
500.0000 mL | Freq: Once | INTRAVENOUS | Status: AC
  Administered 2018-11-06: 500 mL via INTRAVENOUS

## 2018-11-05 MED ORDER — ACETAMINOPHEN 650 MG RE SUPP: 650.0000 mg | Freq: Four times a day (QID) | RECTAL | Status: DC | PRN

## 2018-11-05 MED ORDER — SENNOSIDES-DOCUSATE SODIUM 8.6-50 MG PO TABS: 1.0000 | ORAL_TABLET | Freq: Every evening | ORAL | Status: DC | PRN

## 2018-11-05 MED ORDER — LACTATED RINGERS IV SOLN
INTRAVENOUS | Status: DC
  Administered 2018-11-06 (×2): via INTRAVENOUS

## 2018-11-05 MED ORDER — SACCHAROMYCES BOULARDII 250 MG PO CAPS
250.0000 mg | ORAL_CAPSULE | Freq: Two times a day (BID) | ORAL | Status: DC
  Administered 2018-11-06 – 2018-11-13 (×15): 250 mg via ORAL
  Filled 2018-11-05 (×15): qty 1

## 2018-11-05 MED ORDER — TRAMADOL HCL 50 MG PO TABS
50.0000 mg | ORAL_TABLET | Freq: Four times a day (QID) | ORAL | Status: DC
  Filled 2018-11-05: qty 1

## 2018-11-05 MED ORDER — POTASSIUM CHLORIDE 20 MEQ PO PACK
40.0000 meq | PACK | Freq: Two times a day (BID) | ORAL | Status: AC
  Administered 2018-11-06 (×2): 40 meq via ORAL
  Filled 2018-11-05 (×2): qty 2

## 2018-11-05 MED ORDER — BISACODYL 5 MG PO TBEC: 5.0000 mg | DELAYED_RELEASE_TABLET | Freq: Every day | ORAL | Status: DC | PRN

## 2018-11-05 MED ORDER — ACETAMINOPHEN 325 MG PO TABS: 650.0000 mg | ORAL_TABLET | Freq: Four times a day (QID) | ORAL | Status: DC | PRN

## 2018-11-05 MED ORDER — MORPHINE SULFATE (PF) 2 MG/ML IV SOLN: 2.0000 mg | INTRAVENOUS | Status: DC | PRN

## 2018-11-05 MED ORDER — ONDANSETRON HCL 4 MG PO TABS: 4.0000 mg | ORAL_TABLET | Freq: Four times a day (QID) | ORAL | Status: DC | PRN

## 2018-11-05 MED ORDER — MORPHINE SULFATE (PF) 4 MG/ML IV SOLN: 4.0000 mg | INTRAVENOUS | Status: DC | PRN

## 2018-11-05 MED ORDER — ENOXAPARIN SODIUM 40 MG/0.4ML ~~LOC~~ SOLN
40.0000 mg | Freq: Two times a day (BID) | SUBCUTANEOUS | Status: DC
  Administered 2018-11-06 – 2018-11-08 (×7): 40 mg via SUBCUTANEOUS
  Filled 2018-11-05 (×8): qty 0.4

## 2018-11-05 MED ORDER — METHOCARBAMOL 500 MG PO TABS
500.0000 mg | ORAL_TABLET | Freq: Four times a day (QID) | ORAL | Status: DC | PRN
  Filled 2018-11-05: qty 1

## 2018-11-05 MED ORDER — FIDAXOMICIN 200 MG PO TABS
200.0000 mg | ORAL_TABLET | Freq: Two times a day (BID) | ORAL | Status: DC
  Administered 2018-11-06 – 2018-11-13 (×16): 200 mg via ORAL
  Filled 2018-11-05 (×18): qty 1

## 2018-11-05 MED ORDER — ONDANSETRON HCL 4 MG/2ML IJ SOLN
4.0000 mg | Freq: Four times a day (QID) | INTRAMUSCULAR | Status: DC | PRN
  Administered 2018-11-07: 4 mg via INTRAVENOUS
  Filled 2018-11-05: qty 2

## 2018-11-05 NOTE — ED Triage Notes (Signed)
Pt here from home pov ,just dc this am for c-dif . Pt reports unable to stand (cant feel legs bilaterally) Pt was lifted ou of car and placed in room 12, Cleaned of stool and placed in gown .

## 2018-11-05 NOTE — ED Notes (Signed)
ED TO INPATIENT HANDOFF REPORT  ED Nurse Name and Phone #: Jenny Reichmann [phone]  S Name/Age/Gender Olivia Rodriguez 68 y.o. female Room/Bed: ED12A/ED12A  Code Status   Code Status: Prior  Home/SNF/Other Home Patient oriented to: self, place, time and situation Is this baseline? Yes   Triage Complete: Triage complete  Chief Complaint alt mental status  Triage Note Pt here from home pov ,just dc this am for c-dif . Pt reports unable to stand (cant feel legs bilaterally) Pt was lifted ou of car and placed in room 12, Cleaned of stool and placed in gown .    Allergies Allergies  Allergen Reactions  . Penicillins Hives and Other (See Comments)    Did it involve swelling of the face/tongue/throat, SOB, or low BP? No Did it involve sudden or severe rash/hives, skin peeling, or any reaction on the inside of your mouth or nose? Yes Did you need to seek medical attention at a hospital or doctor's office? Yes When did it last happen? Over 60 years ago If all above answers are "NO", may proceed with cephalosporin use.     Level of Care/Admitting Diagnosis ED Disposition    ED Disposition Condition [facility]: [place] [100120]  Level of Care: Med-Surg [16]  Diagnosis: Clostridium difficile infection [759163]  Admitting Physician: Arta Silence [[id number]]  Attending Physician: Arta Silence [[id number]]  Estimated length of stay: past midnight tomorrow  Certification:: I certify this patient will need inpatient services for at least 2 midnights  PT Class (Do Not Modify): Inpatient [101]  PT Acc Code (Do Not Modify): Private [1]       B Medical/Surgery History Past Medical History:  Diagnosis Date  . Arthritis    Past Surgical History:  Procedure Laterality Date  . CESAREAN SECTION    . REFRACTIVE SURGERY Bilateral 1999  . TONSILLECTOMY    . TOTAL HIP ARTHROPLASTY Left 10/06/2018   Procedure: TOTAL HIP ARTHROPLASTY ANTERIOR  APPROACH-LEFT;  Surgeon: Hessie Knows, MD;  Location: [facility] ORS;  Service: Orthopedics;  Laterality: Left;     A IV Location/Drains/Wounds Patient Lines/Drains/Airways Status   Active Line/Drains/Airways    Name:   Placement date:   Placement time:   Site:   Days:   Peripheral IV 11/05/18 Left Antecubital   11/05/18    2057    Antecubital   less than 1   External Urinary Catheter   10/29/18    2030    -   7          Intake/Output Last 24 hours No intake or output data in the 24 hours ending 11/05/18 2303  Labs/Imaging Results for orders placed or performed during the hospital encounter of 11/05/18 (from the past 48 hour(s))  Basic metabolic panel     Status: Abnormal   Collection Time: 11/05/18  8:20 PM  Result Value Ref Range   Sodium 135 135 - 145 mmol/L   Potassium 3.2 (L) 3.5 - 5.1 mmol/L   Chloride 97 (L) 98 - 111 mmol/L   CO2 30 22 - 32 mmol/L   Glucose, Bld 132 (H) 70 - 99 mg/dL   BUN 14 8 - 23 mg/dL   Creatinine, Ser 0.83 0.44 - 1.00 mg/dL   Calcium 8.0 (L) 8.9 - 10.3 mg/dL   GFR calc non Af Amer >60 >60 mL/min   GFR calc Af Amer >60 >60 mL/min   Anion gap 8 5 - 15    Comment: Performed at  [facility], [address] [place], [place] [address]  Hepatic function panel     Status: Abnormal   Collection Time: 11/05/18  8:20 PM  Result Value Ref Range   Total Protein 5.0 (L) 6.5 - 8.1 g/dL   Albumin 2.4 (L) 3.5 - 5.0 g/dL   AST 29 15 - 41 U/L   ALT 16 0 - 44 U/L   Alkaline Phosphatase 90 38 - 126 U/L   Total Bilirubin 0.5 0.3 - 1.2 mg/dL   Bilirubin, Direct 0.1 0.0 - 0.2 mg/dL   Indirect Bilirubin 0.4 0.3 - 0.9 mg/dL    Comment: Performed at [facility], [address] [place], [place] [address]  Lipase, blood     Status: None   Collection Time: 11/05/18  8:20 PM  Result Value Ref Range   Lipase 34 11 - 51 U/L    Comment: Performed at [facility], [address] [place], [place] [address]  Troponin I - Once     Status:  Abnormal   Collection Time: 11/05/18  8:20 PM  Result Value Ref Range   Troponin I 0.03 (HH) <0.03 ng/mL    Comment: CRITICAL RESULT CALLED TO, READ BACK BY AND VERIFIED WITH Brittiney Dicostanzo 11/05/18 @ 2124  [place] Performed at [facility], [address] [place], [place] [address]   CBC     Status: Abnormal   Collection Time: 11/05/18 10:11 PM  Result Value Ref Range   WBC 19.7 (H) 4.0 - 10.5 K/uL   RBC 4.11 3.87 - 5.11 MIL/uL   Hemoglobin 12.2 12.0 - 15.0 g/dL   HCT 38.9 36.0 - 46.0 %   MCV 94.6 80.0 - 100.0 fL   MCH 29.7 26.0 - 34.0 pg   MCHC 31.4 30.0 - 36.0 g/dL   RDW 13.3 11.5 - 15.5 %   Platelets 296 150 - 400 K/uL   nRBC 0.0 0.0 - 0.2 %    Comment: Performed at [facility], [address] [place], [place] [address]  Differential     Status: Abnormal   Collection Time: 11/05/18 10:11 PM  Result Value Ref Range   Neutrophils Relative % 83 %   Neutro Abs 16.4 (H) 1.7 - 7.7 K/uL   Lymphocytes Relative 8 %   Lymphs Abs 1.6 0.7 - 4.0 K/uL   Monocytes Relative 7 %   Monocytes Absolute 1.4 (H) 0.1 - 1.0 K/uL   Eosinophils Relative 0 %   Eosinophils Absolute 0.0 0.0 - 0.5 K/uL   Basophils Relative 0 %   Basophils Absolute 0.1 0.0 - 0.1 K/uL   Immature Granulocytes 2 %   Abs Immature Granulocytes 0.29 (H) 0.00 - 0.07 K/uL    Comment: Performed at [facility], [address] [place], [place] [address]   No results found.  Pending Labs Unresulted Labs (From admission, onward)    Start     Ordered   11/06/18 0500  Procalcitonin  Daily,   STAT     11/05/18 2246   11/05/18 2247  Procalcitonin - Baseline  Add-on,   AD     11/05/18 2246   11/05/18 2246  Magnesium  Add-on,   AD    Question:  Specimen collection method  Answer:  Unit=Unit collect   11/05/18 2245   11/05/18 2246  Phosphorus  Add-on,   AD    Question:  Specimen collection method  Answer:  Unit=Unit collect   11/05/18 2245   11/05/18 2246  Calcium, ionized  Once,   STAT     Question:  Specimen collection method  Answer:  Unit=Unit collect   11/05/18 2245   11/05/18 2246  Prealbumin  Add-on,   AD    Question:  Specimen collection method  Answer:  Unit=Unit collect   11/05/18 2245   11/05/18 1949  CBC with Differential  Once,   STAT     11/05/18 1948   Signed and Held  Basic metabolic panel  Tomorrow morning,   R     Signed and Held   Signed and Held  CBC  Tomorrow morning,   R     Signed and Held          Vitals/Pain Today's Vitals   11/05/18 2130 11/05/18 2145 11/05/18 2200 11/05/18 2215  BP: 132/62  129/63   Pulse: 86 87 89 86  Resp: (!) 24 18 (!) 23 (!) 25  Temp:      TempSrc:      SpO2: 95% 97% 96% 96%    Isolation Precautions No active isolations  Medications Medications  fidaxomicin (DIFICID) tablet 200 mg (has no administration in time range)  lactated ringers bolus 500 mL (has no administration in time range)  potassium chloride (KLOR-CON) packet 40 mEq (has no administration in time range)  lactated ringers infusion (has no administration in time range)  sodium chloride 0.9 % bolus 500 mL (0 mLs Intravenous Stopped 11/05/18 2142)    Mobility walks     Focused Assessments Neuro Assessment Handoff:  Swallow screen pass? Yes          Neuro Assessment:   Neuro Checks:      Last Documented NIHSS Modified Score:   Has TPA been given? No If patient is a Neuro Trauma and patient is going to OR before floor call report to [place] nurse: [phone] or [phone]     R Recommendations: See Admitting Provider Note  Report given to:   Additional Notes: none

## 2018-11-05 NOTE — Progress Notes (Signed)
PT Cancellation Note  Patient Details Name: Olivia Rodriguez MRN: [id number] DOB: 1951-06-03   Cancelled Treatment:    Reason Eval/Treat Not Completed: Patient declined, no reason specified- patient encouraged to try to get up to be sure she will be able to manage at home. Continues to refuse working with PT. Patient to be discharged home today as she refuses SNF also.    Adelle Zachar 11/05/2018, 11:27 AM

## 2018-11-05 NOTE — Care Management Note (Signed)
Case Management Note  Patient Details  Name: Olivia Rodriguez MRN: [id number] Date of Birth: 07/10/1951   Patient to discharge home today.  Lives at home with daughter.  Daughter to transport at discharge.  New PCP appointment made for Glendon Axe 3/25 @10  am.  Patient has declined SNF placement.  Patient agreeable to home health services.  CMS Medicare.gov Compare Post Acute Care list reviewed with patient. Patient states she does not have a preference of home health agency.  Referral made to [facility] with [facility]. Sound physicians in agreement to sign home health orders in the interim.  Dr Manuella Ghazi and [facility] aware.    RW and BSC to be delivered to room by Leroy Sea from [place] prior to discharge  Subjective/Objective:                    Action/Plan:   Expected Discharge Date:  11/05/18               Expected Discharge Plan:  [name]  In-House Referral:     Discharge planning Services  CM Consult  Post Acute Care Choice:  Durable Medical Equipment, Home Health Choice offered to:  Patient  DME Arranged:  Walker rolling, 3-N-1 DME Agency:  [facility]  HH Arranged:  RN, PT [place] Agency:  [facility]  Status of Service:  Completed, signed off  If discussed at [place] of Stay Meetings, dates discussed:    Additional Comments:  Beverly Sessions, RN 11/05/2018, 2:47 PM

## 2018-11-05 NOTE — Discharge Summary (Signed)
[place] [facility] [place] NAME: Olivia Rodriguez    MR#:  [id number]  DATE OF BIRTH:  Dec 27, 1950  DATE OF ADMISSION:  10/26/2018 ADMITTING PHYSICIAN: Sed[place] Muta, MD  DATE OF DISCHARGE: 11/05/2018  PRIMARY CARE PHYSICIAN: Patient, No Pcp Per   ADMISSION DIAGNOSIS:  Dehydration [E86.0] Colitis presumed to be due to infection [K52.9] Acute renal failure, unspecified acute renal failure type ([place]) [N17.9] Colitis [K52.9]  DISCHARGE DIAGNOSIS:  Acute C. difficile colitis Dehydration Acute kidney injury Peripheral arterial disease SECONDARY DIAGNOSIS:   Past Medical History:  Diagnosis Date  . Arthritis      ADMITTING HISTORY Olivia Rodriguez  is a 68 y.o. female with a known history listed below presented to emergency room for evaluation of diarrhea.  Patient has multiple episodes of loose and liquidy stools for past 3 days.  Patient was recently put on antibiotic for upper respiratory tract symptoms.  Patient started diarrhea after antibiotic treatment.  Patient also has subjective fever and chills.  Patient is complaining of foul-smelling stool.  Patient is also complaining of diffuse abdominal pain.  Today patient has not make any urine.  She feels retention.  Denies chest pain or shortness of breath.  No other complaints.  In emergency room patient has initial evaluation with labs and CT scan of abdomen.  Labs suggestive of elevated WBC.  CT scan showed pancolitis.  Patient has no stool in the emergency room.  C. difficile is pending.  Patient started on empiric treatment for C. difficile colitis.  Hospitalist team requested for admission.  HOSPITAL COURSE:  Patient admitted to medical floor.  Was hydrated with IV fluids.. Empirically started on IV Flagyl and oral vancomycin.  C. difficile toxin was positive.  Patient continued to have diarrhea on oral vancomycin.  She was started on oral Dificid.  Patient's diarrhea responded.  She got formed stools and  started having diet.  Abdominal pain improved.  Nausea vomiting improved.  Patient will be discharged home with home health services.  During the hospitalization vascular surgery also saw the patient for peripheral arterial disease.  They recommended outpatient work-up.  No active intervention recommended.   CONSULTS OBTAINED:  Treatment Team:  Jonathon Bellows, MD  DRUG ALLERGIES:   Allergies  Allergen Reactions  . Penicillins Hives and Other (See Comments)    Did it involve swelling of the face/tongue/throat, SOB, or low BP? No Did it involve sudden or severe rash/hives, skin peeling, or any reaction on the inside of your mouth or nose? Yes Did you need to seek medical attention at a hospital or doctor's office? Yes When did it last happen? Over 60 years ago If all above answers are "NO", may proceed with cephalosporin use.     DISCHARGE MEDICATIONS:   Allergies as of 11/05/2018      Reactions   Penicillins Hives, Other (See Comments)   Did it involve swelling of the face/tongue/throat, SOB, or low BP? No Did it involve sudden or severe rash/hives, skin peeling, or any reaction on the inside of your mouth or nose? Yes Did you need to seek medical attention at a hospital or doctor's office? Yes When did it last happen? Over 60 years ago If all above answers are "NO", may proceed with cephalosporin use.      Medication List    STOP taking these medications   enoxaparin 40 MG/0.4ML injection Commonly known as:  LOVENOX     TAKE these medications   acetaminophen 325  MG tablet Commonly known as:  TYLENOL Take 1-2 tablets (325-650 mg total) by mouth every 6 (six) hours as needed for mild pain (pain score 1-3 or temp > 100.5).   benzonatate 100 MG capsule Commonly known as:  TESSALON Take 100 mg by mouth 3 (three) times daily as needed for cough.   docusate sodium 100 MG capsule Commonly known as:  COLACE Take 1 capsule (100 mg total) by mouth 2 (two) times daily.   feeding  supplement (PRO-STAT SUGAR FREE 64) Liqd Take 30 mLs by mouth 2 (two) times daily between meals.   fidaxomicin 200 MG Tabs tablet Commonly known as:  Dificid Take 1 tablet (200 mg total) by mouth 2 (two) times daily.   fluticasone 50 MCG/ACT nasal spray Commonly known as:  FLONASE Place 2 sprays into both nostrils daily.   hydrochlorothiazide 12.5 MG tablet Commonly known as:  HYDRODIURIL Take 1 tablet (12.5 mg total) by mouth daily for 30 days.   loratadine 10 MG tablet Commonly known as:  CLARITIN Take 10 mg by mouth at bedtime.   methocarbamol 500 MG tablet Commonly known as:  ROBAXIN Take 1 tablet (500 mg total) by mouth every 6 (six) hours as needed for muscle spasms.   multivitamin with minerals Tabs tablet Take 1 tablet by mouth daily.   Myrbetriq 25 MG Tb24 tablet Generic drug:  mirabegron ER Take 25 mg by mouth daily.   ondansetron 4 MG tablet Commonly known as:  ZOFRAN Take 1 tablet (4 mg total) by mouth every 6 (six) hours as needed for nausea.   saccharomyces boulardii 250 MG capsule Commonly known as:  FLORASTOR Take 250 mg by mouth 2 (two) times daily.   traMADol 50 MG tablet Commonly known as:  ULTRAM Take 50 mg by mouth 4 (four) times daily.   vitamin C 250 MG tablet Commonly known as:  ASCORBIC ACID Take 250 mg by mouth 2 (two) times daily.   zinc sulfate 220 (50 Zn) MG capsule Take 220 mg by mouth daily.            Durable Medical Equipment  (From admission, onward)         Start     Ordered   11/05/18 1347  For home use only DME Walker rolling  Once    Question:  Patient needs a walker to treat with the following condition  Answer:  Ambulatory dysfunction   11/05/18 1346          Today  Patient seen today No complaints No abdominal pain No diarrhea Tolerating diet okay Hemodynamically stable VITAL SIGNS:  Blood pressure 136/61, pulse 70, temperature (!) 97.4 F (36.3 C), temperature source Oral, resp. rate 20, height 5'  2" (1.575 m), weight 122.6 kg, SpO2 97 %.  I/O:    Intake/Output Summary (Last 24 hours) at 11/05/2018 1413 Last data filed at 11/05/2018 0455 Gross per 24 hour  Intake -  Output 850 ml  Net -850 ml    PHYSICAL EXAMINATION:  Physical Exam  GENERAL:  68 y.o.-year-old patient lying in the bed with no acute distress.  LUNGS: Normal breath sounds bilaterally, no wheezing, rales,rhonchi or crepitation. No use of accessory muscles of respiration.  CARDIOVASCULAR: S1, S2 normal. No murmurs, rubs, or gallops.  ABDOMEN: Soft, non-tender, non-distended. Bowel sounds present. No organomegaly or mass.  NEUROLOGIC: Moves all 4 extremities. PSYCHIATRIC: The patient is alert and oriented x 3.  SKIN: No obvious rash, lesion, or ulcer.   DATA REVIEW:  CBC Recent Labs  Lab 11/03/18 0419  WBC 15.9*  HGB 13.6  HCT 42.0  PLT 443*    Chemistries  Recent Labs  Lab 11/04/18 0442  NA 133*  K 3.6  CL 97*  CO2 29  GLUCOSE 119*  BUN 14  CREATININE 0.63  CALCIUM 7.8*  MG 2.1    Cardiac Enzymes No results for input(s): TROPONINI in the last 168 hours.  Microbiology Results  Results for orders placed or performed during the hospital encounter of 10/26/18  Blood culture (routine x 2)     Status: None   Collection Time: 10/26/18 10:29 PM  Result Value Ref Range Status   Specimen Description BLOOD BLOOD RIGHT WRIST  Final   Special Requests   Final    BOTTLES DRAWN AEROBIC AND ANAEROBIC Blood Culture adequate volume   Culture   Final    NO GROWTH 5 DAYS Performed at [facility], [address], [place], [place] [address]    Report Status 10/31/2018 FINAL  Final  Blood culture (routine x 2)     Status: None   Collection Time: 10/26/18 10:29 PM  Result Value Ref Range Status   Specimen Description BLOOD BLOOD LEFT FOREARM  Final   Special Requests   Final    BOTTLES DRAWN AEROBIC AND ANAEROBIC Blood Culture adequate volume   Culture   Final    NO GROWTH 5  DAYS Performed at [facility], [address] [place], [place] [address]    Report Status 10/31/2018 FINAL  Final  Urine Culture     Status: Abnormal   Collection Time: 10/26/18 11:47 PM  Result Value Ref Range Status   Specimen Description   Final    URINE, RANDOM Performed at [facility], [address], [place], [place] [address]    Special Requests   Final    NONE Performed at [facility], [address] [place], [place] [address]    Culture MULTIPLE SPECIES PRESENT, SUGGEST RECOLLECTION (A)  Final   Report Status 10/28/2018 FINAL  Final  C difficile quick scan w PCR reflex     Status: Abnormal   Collection Time: 10/27/18  9:15 AM  Result Value Ref Range Status   C Diff antigen POSITIVE (A) NEGATIVE Final   C Diff toxin POSITIVE (A) NEGATIVE Final   C Diff interpretation Toxin producing C. difficile detected.  Final    Comment: CRITICAL RESULT CALLED TO, READ BACK BY AND VERIFIED WITH: [place] HAHLE 10/27/18 1730 KLW Performed at [facility], [address] [place], [place] [address]     RADIOLOGY:  No results found.  Follow up with PCP in 1 week.  Management plans discussed with the patient, family and they are in agreement.  CODE STATUS: Full code    Code Status Orders  (From admission, onward)         Start     Ordered   10/27/18 0135  Full code  Continuous     10/27/18 0135        Code Status History    Date Active Date Inactive Code Status Order ID Comments User Context   10/06/2018 1550 10/09/2018 2338 Full Code [id number]  Hessie Knows, MD Inpatient      TOTAL TIME TAKING CARE OF THIS PATIENT ON DAY OF DISCHARGE: more than 35 minutes.   Saundra Shelling M.D on 11/05/2018 at 2:13 PM  Between 7am to 6pm - Pager - [phone]  After 6pm go to  [email or web address] - password [email or web address] [place] [facility]  Office  [phone]  CC: Primary care physician; Patient, No Pcp Per  Note: This dictation was  prepared with Dragon dictation along with smaller phrase technology. Any transcriptional errors that result from this process are unintentional.

## 2018-11-05 NOTE — ED Provider Notes (Signed)
[facility] ([facility] Emergency Department Provider Note ____________________________________________   First MD Initiated Contact with Patient 11/05/18 1937     (approximate)  I have reviewed the triage vital signs and the nursing notes.   HISTORY  Chief Complaint Altered Mental Status and Weakness    HPI Kinzie Wickes is a 68 y.o. female with PMH as noted below and status post admission for C. difficile colitis who presents with recurrent generalized weakness and diarrhea this evening.  The patient was just discharged this afternoon and states that immediately after getting home she felt weak in her legs, was unable to get up, and had new diarrhea with a trace amount of blood.  She denies any abdominal pain, vomiting, or fever.  She does endorse nausea.  The patient states she feels similar to when she was initially admitted.   Past Medical History:  Diagnosis Date  . Arthritis     Patient Active Problem List   Diagnosis Date Noted  . Colitis 10/27/2018  . Status post total hip replacement, left 10/06/2018    Past Surgical History:  Procedure Laterality Date  . CESAREAN SECTION    . REFRACTIVE SURGERY Bilateral 1999  . TONSILLECTOMY    . TOTAL HIP ARTHROPLASTY Left 10/06/2018   Procedure: TOTAL HIP ARTHROPLASTY ANTERIOR APPROACH-LEFT;  Surgeon: Hessie Knows, MD;  Location: [facility] ORS;  Service: Orthopedics;  Laterality: Left;    Prior to Admission medications   Medication Sig Start Date End Date Taking? Authorizing Provider  acetaminophen (TYLENOL) 325 MG tablet Take 1-2 tablets (325-650 mg total) by mouth every 6 (six) hours as needed for mild pain (pain score 1-3 or temp > 100.5). 10/08/18  Yes Duanne Guess, PA-C  Amino Acids-Protein Hydrolys (FEEDING SUPPLEMENT, PRO-STAT SUGAR FREE 64,) LIQD Take 30 mLs by mouth 2 (two) times daily between meals.   Yes [provider]  benzonatate (TESSALON) 100 MG capsule Take 100 mg by mouth 3 (three) times  daily as needed for cough.   Yes [provider]  docusate sodium (COLACE) 100 MG capsule Take 1 capsule (100 mg total) by mouth 2 (two) times daily. 10/08/18  Yes Duanne Guess, PA-C  fidaxomicin (DIFICID) 200 MG TABS tablet Take 1 tablet (200 mg total) by mouth 2 (two) times daily. 11/02/18  Yes Wieting, Richard, MD  fluticasone (FLONASE) 50 MCG/ACT nasal spray Place 2 sprays into both nostrils daily.   Yes [provider]  hydrochlorothiazide (HYDRODIURIL) 12.5 MG tablet Take 1 tablet (12.5 mg total) by mouth daily for 30 days. 11/05/18 12/05/18 Yes Pyreddy, Reatha Harps, MD  loratadine (CLARITIN) 10 MG tablet Take 10 mg by mouth at bedtime.   Yes [provider]  methocarbamol (ROBAXIN) 500 MG tablet Take 1 tablet (500 mg total) by mouth every 6 (six) hours as needed for muscle spasms. 10/08/18  Yes Duanne Guess, PA-C  mirabegron ER (MYRBETRIQ) 25 MG TB24 tablet Take 25 mg by mouth daily.   Yes [provider]  Multiple Vitamin (MULTIVITAMIN WITH MINERALS) TABS tablet Take 1 tablet by mouth daily.   Yes [provider]  ondansetron (ZOFRAN) 4 MG tablet Take 1 tablet (4 mg total) by mouth every 6 (six) hours as needed for nausea. 11/05/18  Yes Pyreddy, Reatha Harps, MD  saccharomyces boulardii (FLORASTOR) 250 MG capsule Take 250 mg by mouth 2 (two) times daily.   Yes [provider]  traMADol (ULTRAM) 50 MG tablet Take 50 mg by mouth 4 (four) times daily.   Yes  [provider]  vitamin C (ASCORBIC ACID) 250 MG tablet Take 250 mg by mouth 2 (two) times daily.   Yes [provider]  zinc sulfate 220 (50 Zn) MG capsule Take 220 mg by mouth daily.   Yes [provider]    Allergies Penicillins  No family history on file.  Social History Social History   Tobacco Use  . Smoking status: Never Smoker  . Smokeless tobacco: Never Used  Substance Use Topics  . Alcohol use: Not Currently    Comment: Occasional wine or mixed drink   . Drug use: Never    Review of Systems  Constitutional: No fever. Eyes: No visual changes. ENT: No sore throat. Cardiovascular: Denies chest pain. Respiratory: Denies shortness of breath. Gastrointestinal: Positive for nausea and diarrhea. Genitourinary: Negative for dysuria.  Musculoskeletal: Negative for back pain. Skin: Negative for rash. Neurological: Negative for headache.   ____________________________________________   PHYSICAL EXAM:  VITAL SIGNS: ED Triage Vitals [11/05/18 1926]  Enc Vitals Group     BP 127/61     Pulse Rate 90     Resp (!) 28     Temp 98 F (36.7 C)     Temp Source Oral     SpO2      Weight      Height      Head Circumference      [place] Flow      Pain Score      Pain Loc      Pain Edu?      Excl. in [place]?     Constitutional: Alert and oriented.  Somewhat weak appearing but in no acute distress. Eyes: Conjunctivae are normal.  Head: Atraumatic. Nose: No congestion/rhinnorhea. Mouth/Throat: Mucous membranes are slightly dry.   Neck: Normal range of motion.  Cardiovascular: Normal rate, regular rhythm. Grossly normal heart sounds.  Good peripheral circulation. Respiratory: Normal respiratory effort.  No retractions. Lungs CTAB. Gastrointestinal: Soft and nontender. No distention.  Genitourinary: No flank tenderness. Musculoskeletal: No lower extremity edema.  Extremities warm and well perfused.  Neurologic:  Normal speech and language.  5/5 motor strength and intact sensation of bilateral upper and lower extremities.   Skin:  Skin is warm and dry. No rash noted. Psychiatric: Mood and affect are normal. Speech and behavior are normal.  ____________________________________________   LABS (all labs ordered are listed, but only abnormal results are displayed)  Labs Reviewed  BASIC METABOLIC PANEL  CBC WITH DIFFERENTIAL/PLATELET  HEPATIC FUNCTION PANEL  LIPASE, BLOOD  TROPONIN I    ____________________________________________  EKG   ____________________________________________  RADIOLOGY    ____________________________________________   PROCEDURES  Procedure(s) performed: No  Procedures  Critical Care performed: No ____________________________________________   INITIAL IMPRESSION / ASSESSMENT AND PLAN / ED COURSE  Pertinent labs & imaging results that were available during my care of the patient were reviewed by me and considered in my medical decision making (see chart for details).  68 year old female with PMH as noted above presents with acute onset of weakness and diarrhea after just being discharged from the hospital this afternoon.  I reviewed the past medical records in [place].  The patient was admitted for C. difficile colitis and dehydration after recently being in a rehab after surgery.  During her stay the diarrhea improved and she was discharged this afternoon in stable condition.  On exam currently, her vital signs are normal.  The abdomen is soft and nontender.  The remainder of the exam is unremarkable.  Overall  I suspect most likely weakness and near syncope related to persistent dehydration.  The patient continues to have active C. difficile symptoms.  At this time, I do not feel that it is safe for her to be at home.  We will obtain labs, give a fluid bolus, and plan for likely readmission.  ----------------------------------------- 9:15 PM on 11/05/2018 -----------------------------------------  Lab work-up is still pending.  I signed the patient out to the oncoming physician Dr. Kerman Passey.  Plan will be for likely admission. _____________________________________   FINAL CLINICAL IMPRESSION(S) / ED DIAGNOSES  Final diagnoses:  C. difficile diarrhea  Generalized weakness      NEW MEDICATIONS STARTED DURING THIS VISIT:  New Prescriptions   No medications on file     Note:  This document was prepared using Dragon  voice recognition software and may include unintentional dictation errors.    Arta Silence, MD 11/05/18 2116

## 2018-11-05 NOTE — Progress Notes (Signed)
The patient refused to get up when PT came to work with the patient. Nausea meds provided before PT saw the patient. The patient continued to refuse to get out of the bed with the assistance of the nurse and nurse tech. The patient was educated about the importance of getting out of bed to prevent pneumonia and to prevent blood clots. The continued to refuse after education. The patient indicated she was being pushed to do something we was not capable of doing at this point do to feeling drowsy. She was told nausea meds could make her feed drowsy yet she should try to get up.

## 2018-11-06 ENCOUNTER — Other Ambulatory Visit: Payer: Self-pay

## 2018-11-06 LAB — PROCALCITONIN
Procalcitonin: <0.1 ng/mL
Procalcitonin: <0.1 ng/mL

## 2018-11-06 LAB — PREALBUMIN: Prealbumin: 18.9 mg/dL (ref 18–38)

## 2018-11-06 LAB — PHOSPHORUS: Phosphorus: 2.2 mg/dL — ABNORMAL LOW (ref 2.5–4.6)

## 2018-11-06 LAB — MAGNESIUM: Magnesium: 2.1 mg/dL (ref 1.7–2.4)

## 2018-11-06 MED ORDER — POTASSIUM PHOSPHATES 15 MMOLE/5ML IV SOLN
20.0000 mmol | Freq: Once | INTRAVENOUS | Status: AC
  Administered 2018-11-06: 03:00:00 20 mmol via INTRAVENOUS
  Filled 2018-11-06: qty 6.67

## 2018-11-06 NOTE — H&P (Addendum)
[place] [facility] [place] NAME: Markeeta Scalf    MR#:  [id number]  DATE OF BIRTH:  1951/02/02  DATE OF ADMISSION:  11/05/2018  PRIMARY CARE PHYSICIAN: Patient, No Pcp Per   REQUESTING/REFERRING PHYSICIAN: Arta Silence, MD  CHIEF COMPLAINT:   Chief Complaint  Patient presents with  . Altered Mental Status  . Weakness    HISTORY OF PRESENT ILLNESS:  Delsy Etzkorn  is a 68 y.o. female with a known history of HTN, DJD/OA p/w diarrhea. She states she was fine until she recently had a L hip replacement. She states that she was offered the option to go home w/ Home P/T, but decided to go to [facility] instead. She states while she was there, she developed URI/bronchitis, and was treated w/ Azithromycin x5d. She states she subsequently developed diarrhea and weakness. She was admitted to [facility] from 03/02/202 to 11/05/2018 w/ clostridium difficile colitis, AKI and dehydration. She says she was started on Dificid and D/Ced home on 11/05/2018. She states her diarrhea was improving, but did not completely abate prior to going home. She states the diarrhea got worse at home. She endorses blood streaks in the stool and on toilet paper, but denies gross red blood in toilet bowl. She did take a dose of Dificid on Thursday (11/05/2018) morning. She endorses fatigue/malaise/generalized weakness, progressively worsening over the course of the day. She states she was unable to get out of the car without assistance after arriving back to the ED. Had multiple bowel movements while in the car. Endorses dehydration. Endorses lower abdominal cramping, but denies frank AP. Denies fever, chills, diaphoresis, night sweats, rigors. Appears miserable but not toxic/diaphoretic. WBC 19.7, tachypneic, SIRS (+), (+) GI source; meets criteria for sepsis.  PAST MEDICAL HISTORY:   Past Medical History:  Diagnosis Date  . Arthritis     PAST SURGICAL HISTORY:   Past Surgical  History:  Procedure Laterality Date  . CESAREAN SECTION    . REFRACTIVE SURGERY Bilateral 1999  . TONSILLECTOMY    . TOTAL HIP ARTHROPLASTY Left 10/06/2018   Procedure: TOTAL HIP ARTHROPLASTY ANTERIOR APPROACH-LEFT;  Surgeon: Hessie Knows, MD;  Location: [facility] ORS;  Service: Orthopedics;  Laterality: Left;    SOCIAL HISTORY:   Social History   Tobacco Use  . Smoking status: Never Smoker  . Smokeless tobacco: Never Used  Substance Use Topics  . Alcohol use: Not Currently    Comment: Occasional wine or mixed drink    FAMILY HISTORY:  History reviewed. No pertinent family history.  DRUG ALLERGIES:   Allergies  Allergen Reactions  . Penicillins Hives and Other (See Comments)    Did it involve swelling of the face/tongue/throat, SOB, or low BP? No Did it involve sudden or severe rash/hives, skin peeling, or any reaction on the inside of your mouth or nose? Yes Did you need to seek medical attention at a hospital or doctor's office? Yes When did it last happen? Over 60 years ago If all above answers are "NO", may proceed with cephalosporin use.     REVIEW OF SYSTEMS:   Review of Systems  Constitutional: Positive for malaise/fatigue. Negative for chills, diaphoresis, fever and weight loss.  HENT: Negative for congestion, ear pain, hearing loss, nosebleeds, sinus pain, sore throat and tinnitus.   Eyes: Negative for blurred vision, double vision and photophobia.  Respiratory: Negative for cough, hemoptysis, sputum production, shortness of breath and wheezing.   Cardiovascular: Negative for chest pain, palpitations, orthopnea,  claudication, leg swelling and PND.  Gastrointestinal: Positive for abdominal pain, blood in stool and diarrhea. Negative for constipation, heartburn, melena, nausea and vomiting.  Genitourinary: Negative for dysuria, frequency, hematuria and urgency.  Musculoskeletal: Negative for back pain, falls, joint pain, myalgias and neck pain.  Skin: Negative for  itching and rash.  Neurological: Positive for weakness. Negative for dizziness, tingling, tremors, sensory change, speech change, focal weakness, seizures, loss of consciousness and headaches.  Psychiatric/Behavioral: Negative for depression and memory loss. The patient is not nervous/anxious and does not have insomnia.    MEDICATIONS AT HOME:   Prior to Admission medications   Medication Sig Start Date End Date Taking? Authorizing Provider  acetaminophen (TYLENOL) 325 MG tablet Take 1-2 tablets (325-650 mg total) by mouth every 6 (six) hours as needed for mild pain (pain score 1-3 or temp > 100.5). 10/08/18  Yes Duanne Guess, PA-C  Amino Acids-Protein Hydrolys (FEEDING SUPPLEMENT, PRO-STAT SUGAR FREE 64,) LIQD Take 30 mLs by mouth 2 (two) times daily between meals.   Yes [provider]  benzonatate (TESSALON) 100 MG capsule Take 100 mg by mouth 3 (three) times daily as needed for cough.   Yes [provider]  docusate sodium (COLACE) 100 MG capsule Take 1 capsule (100 mg total) by mouth 2 (two) times daily. 10/08/18  Yes Duanne Guess, PA-C  fidaxomicin (DIFICID) 200 MG TABS tablet Take 1 tablet (200 mg total) by mouth 2 (two) times daily. 11/02/18  Yes Wieting, Richard, MD  fluticasone (FLONASE) 50 MCG/ACT nasal spray Place 2 sprays into both nostrils daily.   Yes [provider]  hydrochlorothiazide (HYDRODIURIL) 12.5 MG tablet Take 1 tablet (12.5 mg total) by mouth daily for 30 days. 11/05/18 12/05/18 Yes Pyreddy, Reatha Harps, MD  loratadine (CLARITIN) 10 MG tablet Take 10 mg by mouth at bedtime.   Yes [provider]  methocarbamol (ROBAXIN) 500 MG tablet Take 1 tablet (500 mg total) by mouth every 6 (six) hours as needed for muscle spasms. 10/08/18  Yes Duanne Guess, PA-C  mirabegron ER (MYRBETRIQ) 25 MG TB24 tablet Take 25 mg by mouth daily.   Yes [provider]  Multiple Vitamin (MULTIVITAMIN WITH MINERALS) TABS tablet Take 1 tablet by mouth  daily.   Yes [provider]  ondansetron (ZOFRAN) 4 MG tablet Take 1 tablet (4 mg total) by mouth every 6 (six) hours as needed for nausea. 11/05/18  Yes Pyreddy, Reatha Harps, MD  saccharomyces boulardii (FLORASTOR) 250 MG capsule Take 250 mg by mouth 2 (two) times daily.   Yes [provider]  traMADol (ULTRAM) 50 MG tablet Take 50 mg by mouth 4 (four) times daily.   Yes [provider]  vitamin C (ASCORBIC ACID) 250 MG tablet Take 250 mg by mouth 2 (two) times daily.   Yes [provider]  zinc sulfate 220 (50 Zn) MG capsule Take 220 mg by mouth daily.   Yes [provider]      VITAL SIGNS:  Blood pressure 130/65, pulse 88, temperature 98.1 F (36.7 C), temperature source Oral, resp. rate 18, SpO2 95 %.  PHYSICAL EXAMINATION:  Physical Exam Constitutional:      General: She is not in acute distress.    Appearance: She is ill-appearing. She is not toxic-appearing or diaphoretic.     Interventions: She is not intubated. HENT:     Head: Atraumatic.     Mouth/Throat:     Pharynx: Oropharynx is clear.  Eyes:  General: No scleral icterus.    Extraocular Movements: Extraocular movements intact.     Conjunctiva/sclera: Conjunctivae normal.  Neck:     Musculoskeletal: Neck supple.  Cardiovascular:     Rate and Rhythm: Normal rate and regular rhythm.     Heart sounds: Normal heart sounds. No murmur. No friction rub. No gallop.   Pulmonary:     Effort: Pulmonary effort is normal. Tachypnea present. No accessory muscle usage, prolonged expiration, respiratory distress or retractions. She is not intubated.     Breath sounds: Normal breath sounds and air entry. No stridor, decreased air movement or transmitted upper airway sounds. No decreased breath sounds, wheezing, rhonchi or rales.  Abdominal:     General: Bowel sounds are increased. There is no distension.     Palpations: Abdomen is soft.     Tenderness: There is no abdominal tenderness. There  is no guarding or rebound.  Musculoskeletal: Normal range of motion.        General: No swelling or tenderness.     Right lower leg: No edema.     Left lower leg: No edema.  Lymphadenopathy:     Cervical: No cervical adenopathy.  Skin:    General: Skin is warm and dry.     Findings: No erythema or rash.  Neurological:     Mental Status: She is alert and oriented to person, place, and time. Mental status is at baseline.  Psychiatric:        Mood and Affect: Mood normal.        Behavior: Behavior normal. Behavior is cooperative.        Thought Content: Thought content normal.        Judgment: Judgment normal.    LABORATORY PANEL:   CBC Recent Labs  Lab 11/05/18 2211  WBC 19.7*  HGB 12.2  HCT 38.9  PLT 296   ------------------------------------------------------------------------------------------------------------------  Chemistries  Recent Labs  Lab 11/05/18 2020  NA 135  K 3.2*  CL 97*  CO2 30  GLUCOSE 132*  BUN 14  CREATININE 0.83  CALCIUM 8.0*  MG 2.1  AST 29  ALT 16  ALKPHOS 90  BILITOT 0.5   ------------------------------------------------------------------------------------------------------------------  Cardiac Enzymes Recent Labs  Lab 11/05/18 2020  TROPONINI 0.03*   ------------------------------------------------------------------------------------------------------------------  RADIOLOGY:  No results found.  IMPRESSION AND PLAN:   A/P: 40F w/ PMHx HTN, DJD/OA p/w diarrhea, recent Dx clostridium difficile colitis. SIRS (+), (+) sepsis. Dehydration, hypokalemia, hypochloremia, hyperglycemia, hypocalcemia, hypophosphatemia, hypoproteinemia/hypoalbuminemia, leukocytosis. -Diarrhea, recent Dx clostridium difficile colitis, leukocytosis, SIRS, sepsis: Leukocytosis + tachypnea, SIRS (+). Recent Dx C. Difficile, continued diarrhea. Technically meets criteria for sepsis. Does not appear toxic. c/w Dificid. IVF. -Dehydration, hypochloremia: Volume  contraction. IVF. -Hypokalemia: K+ 3.2. Replete and monitor. Mag level WNL (2.1). -Hypocalcemia: Ionized calcium. -Hypophosphatemia: Phos 2.2. KPhos, replete and monitor. -Hypoproteinemia, hypoalbuminemia: BMI 49.44, however pt may be paradoxically malnourished 2/2 recent surgery, stay at rehab facility, subsequent respiratory + GI illness w/ prolonged hospitalization. Prealbumin. -c/w other home meds/formulary subs as tolerated. -FEN/GI: Cardiac diet. -DVT PPx: Lovenox. -Code status: Full code. -Disposition: Admission, > 2 midnights.   All the records are reviewed and case discussed with ED provider. Management plans discussed with the patient, family and they are in agreement.  CODE STATUS: Full code.  TOTAL TIME TAKING CARE OF THIS PATIENT: 75 minutes.    Arta Silence M.D on 11/06/2018 at 12:39 AM  Between 7am to 6pm - Pager - [phone]  After 6pm go to [email or web address] - password  [email or web address]  [facility] [place] [facility]  Office  [phone]  CC: Primary care physician; Patient, No Pcp Per   Note: This dictation was prepared with Dragon dictation along with smaller phrase technology. Any transcriptional errors that result from this process are unintentional.

## 2018-11-06 NOTE — Progress Notes (Signed)
Pharmacy lovenox dose adjustment. Patient w/ TBW of 122.6 kg w/ BMI > 40 (49.44) CrCl 81 ml/min  Will adjust lovenox to 40 mg subq twice daily.  Tobie Lords, PharmD, BCPS Clinical Pharmacist 11/06/2018

## 2018-11-06 NOTE — Plan of Care (Signed)

## 2018-11-06 NOTE — TOC Initial Note (Signed)
Transition of Care [facility]) - Initial/Assessment Note    Patient Details  Name: Olivia Rodriguez MRN: [id number] Date of Birth: [birth year]  [facility] CM/SW Contact:    Olivia Ammons, RN Phone Number: 11/06/2018, 12:20 PM  Clinical Narrative:   Re-admitted to [facility] skilled nursing facility last visit, [facility] 10/09/18 Sees Dr. Candiss Norse as primary care physician. [place] was arranged prior to 11/05/18 discharge.  Daughter is Olivia Rodriguez [phone]).  [facility] for Pharmacy. Meredeth Ide representative updated on re-admission,.                Expected Discharge Plan: [place] Barriers to Discharge: (Re-admit Discharged 11/05/18)   Patient Goals and CMS Choice   CMS Medicare.gov Compare Post Acute Care list provided to:: Patient Choice offered to / list presented to : Patient  Expected Discharge Plan and Services Expected Discharge Plan: [place] Discharge Planning Services: CM Consult Post Acute Care Choice: Durable Medical Equipment, Home Health Living arrangements for the past 2 months: Single Family Home                 DME Arranged: (Rolling walker prior to last discharge) DME Agency: [facility] HH Arranged: RN, PT [place] Agency: [facility])  Prior Living Arrangements/Services Living arrangements for the past 2 months: Single Family Home Lives with:: Adult Children, Self Patient language and need for interpreter reviewed:: No Do you feel safe going back to the place where you live?: (unsure)      Need for Family Participation in Patient Care: No (Comment) Care giver support system in place?: Yes (comment) Current home services: Home PT, Home RN Criminal Activity/Legal Involvement Pertinent to Current Situation/Hospitalization: No - Comment as needed  Activities of Daily Living Home Assistive Devices/Equipment: None, Walker (specify type) ADL Screening (condition at time of  admission) Patient's cognitive ability adequate to safely complete daily activities?: Yes Is the patient deaf or have difficulty hearing?: No Does the patient have difficulty seeing, even when wearing glasses/contacts?: No Does the patient have difficulty concentrating, remembering, or making decisions?: No Patient able to express need for assistance with ADLs?: Yes Does the patient have difficulty dressing or bathing?: No Independently performs ADLs?: Yes (appropriate for developmental age) Does the patient have difficulty walking or climbing stairs?: No Weakness of Legs: None Weakness of Arms/Hands: None  Permission Sought/Granted Permission sought to share information with : Case Manager                Emotional Assessment Appearance:: Appears stated age Attitude/Demeanor/Rapport: (pleasant) Affect (typically observed): Accepting Orientation: : Oriented to Self, Oriented to Place, Oriented to  Time, Oriented to Situation Alcohol / Substance Use: Never Used Psych Involvement: No (comment)  Admission diagnosis:  C. difficile diarrhea [A04.72] Generalized weakness [R53.1] Patient Active Problem List   Diagnosis Date Noted  . Clostridium difficile infection 11/05/2018  . Colitis 10/27/2018  . Status post total hip replacement, left 10/06/2018   PCP:  Patient, No Pcp Per Pharmacy:   [facility], [place] [address] Phone: [phone] Fax: [phone]  [facility], [address] [place] [address] Phone: [phone] Fax: [phone]     Social Determinants of Health (SDOH) Interventions    Readmission Risk Interventions 30 Day Unplanned Readmission Risk Score     ED to Hosp-Admission (Current) from  11/05/2018 in [place] (1C)  30 Day Unplanned Readmission Risk Score (%)  15 Filed at 11/06/2018 1200      This score is the patient's risk of an unplanned readmission within 30 days of being discharged (0 -100%). The score is based on dignosis, age, lab data, medications, orders, and past utilization.   Low:  0-14.9   Medium: 15-21.9   High: 22-29.9   Extreme: 30 and above       No flowsheet data found.

## 2018-11-06 NOTE — Progress Notes (Signed)
[facility] [place] NAME: Olivia Rodriguez    MR#:  [id number]  DATE OF BIRTH:  01-04-1951  SUBJECTIVE:  CHIEF COMPLAINT:   Chief Complaint  Patient presents with  . Altered Mental Status  . Weakness   Patient complains of abdominal cramp and has diarrhea without melena or bloody stool this morning. REVIEW OF SYSTEMS:  Review of Systems  Constitutional: Positive for malaise/fatigue. Negative for chills and fever.  HENT: Negative for sore throat.   Eyes: Negative for blurred vision and double vision.  Respiratory: Negative for cough, hemoptysis, shortness of breath, wheezing and stridor.   Cardiovascular: Negative for chest pain, palpitations, orthopnea and leg swelling.  Gastrointestinal: Positive for abdominal pain and diarrhea. Negative for blood in stool, melena, nausea and vomiting.  Genitourinary: Negative for dysuria, flank pain and hematuria.  Musculoskeletal: Negative for back pain and joint pain.  Skin: Negative for rash.  Neurological: Negative for dizziness, sensory change, focal weakness, seizures, loss of consciousness, weakness and headaches.  Endo/Heme/Allergies: Negative for polydipsia.  Psychiatric/Behavioral: Negative for depression. The patient is not nervous/anxious.     DRUG ALLERGIES:   Allergies  Allergen Reactions  . Penicillins Hives and Other (See Comments)    Did it involve swelling of the face/tongue/throat, SOB, or low BP? No Did it involve sudden or severe rash/hives, skin peeling, or any reaction on the inside of your mouth or nose? Yes Did you need to seek medical attention at a hospital or doctor's office? Yes When did it last happen? Over 60 years ago If all above answers are "NO", may proceed with cephalosporin use.    VITALS:  Blood pressure 138/64, pulse 68, temperature 97.6 F (36.4 C), temperature source Oral, resp. rate 18, SpO2 98 %. PHYSICAL EXAMINATION:  Physical Exam Constitutional:       General: She is not in acute distress.    Appearance: Normal appearance.  HENT:     Head: Normocephalic.     Mouth/Throat:     Mouth: Mucous membranes are moist.  Eyes:     General: No scleral icterus.    Conjunctiva/sclera: Conjunctivae normal.     Pupils: Pupils are equal, round, and reactive to light.  Neck:     Musculoskeletal: Normal range of motion and neck supple.     Vascular: No JVD.     Trachea: No tracheal deviation.  Cardiovascular:     Rate and Rhythm: Normal rate and regular rhythm.     Heart sounds: Normal heart sounds. No murmur. No gallop.   Pulmonary:     Effort: Pulmonary effort is normal. No respiratory distress.     Breath sounds: Normal breath sounds. No wheezing or rales.  Abdominal:     General: Bowel sounds are normal. There is no distension.     Palpations: Abdomen is soft.     Tenderness: There is abdominal tenderness. There is no rebound.  Musculoskeletal: Normal range of motion.        General: No tenderness.     Right lower leg: No edema.     Left lower leg: No edema.  Skin:    Findings: No erythema or rash.  Neurological:     General: No focal deficit present.     Mental Status: She is alert and oriented to person, place, and time.     Cranial Nerves: No cranial nerve deficit.  Psychiatric:        Mood and Affect: Mood normal.  LABORATORY PANEL:  Female CBC Recent Labs  Lab 11/05/18 2211  WBC 19.7*  HGB 12.2  HCT 38.9  PLT 296   ------------------------------------------------------------------------------------------------------------------ Chemistries  Recent Labs  Lab 11/05/18 2020  NA 135  K 3.2*  CL 97*  CO2 30  GLUCOSE 132*  BUN 14  CREATININE 0.83  CALCIUM 8.0*  MG 2.1  AST 29  ALT 16  ALKPHOS 90  BILITOT 0.5   RADIOLOGY:  No results found. ASSESSMENT AND PLAN:   C. difficile colitis with worsening leukocytosis.  Continue Dificid.  Antiemetics as needed for nausea.  Follow-up CBC. Hyponatremia.   Improved. Hypokalemia.  Potassium supplement.  Magnesium is normal. Essential hypertension.  Continue hydrochlorothiazide Recent left hip surgery.  Physical therapy evaluation pending patient could not work with them today as she is weak Bluish toes with pulses that are good on both extremities.  Vascular surgery recommended TED hose.  Outpatient follow-up Obesity with a BMI of 48.83.  Weight loss needed Generalized weakness PT  All the records are reviewed and case discussed with Care Management/Social Worker. Management plans discussed with the patient, family and they are in agreement.  CODE STATUS: Full Code  TOTAL TIME TAKING CARE OF THIS PATIENT: 33 minutes.   More than 50% of the time was spent in counseling/coordination of care: YES  POSSIBLE D/C IN 1-2 DAYS, DEPENDING ON CLINICAL CONDITION.   Demetrios Loll M.D on 11/06/2018 at 1:38 PM  Between 7am to 6pm - Pager - [phone]  After 6pm go to [email or web address] - [facility]

## 2018-11-06 NOTE — Plan of Care (Signed)
  Problem: Education: Goal: Knowledge of General Education information will improve Description Including pain rating scale, medication(s)/side effects and non-pharmacologic comfort measures 11/06/2018 0455 by Herbie Baltimore, RN Outcome: Progressing 11/06/2018 0420 by Herbie Baltimore, RN Outcome: Progressing   Problem: Health Behavior/Discharge Planning: Goal: Ability to manage health-related needs will improve 11/06/2018 0455 by Herbie Baltimore, RN Outcome: Progressing 11/06/2018 0420 by Herbie Baltimore, RN Outcome: Progressing   Problem: Clinical Measurements: Goal: Ability to maintain clinical measurements within normal limits will improve 11/06/2018 0455 by Herbie Baltimore, RN Outcome: Progressing 11/06/2018 0420 by Herbie Baltimore, RN Outcome: Progressing Goal: Will remain free from infection 11/06/2018 0455 by Herbie Baltimore, RN Outcome: Progressing 11/06/2018 0420 by Herbie Baltimore, RN Outcome: Progressing Goal: Diagnostic test results will improve 11/06/2018 0455 by Herbie Baltimore, RN Outcome: Progressing 11/06/2018 0420 by Herbie Baltimore, RN Outcome: Progressing Goal: Respiratory complications will improve 11/06/2018 0455 by Herbie Baltimore, RN Outcome: Progressing 11/06/2018 0420 by Herbie Baltimore, RN Outcome: Progressing Goal: Cardiovascular complication will be avoided 11/06/2018 0455 by Herbie Baltimore, RN Outcome: Progressing 11/06/2018 0420 by Herbie Baltimore, RN Outcome: Progressing   Problem: Activity: Goal: Risk for activity intolerance will decrease 11/06/2018 0455 by Herbie Baltimore, RN Outcome: Progressing 11/06/2018 0420 by Herbie Baltimore, RN Outcome: Progressing   Problem: Nutrition: Goal: Adequate nutrition will be maintained 11/06/2018 0455 by Herbie Baltimore, RN Outcome: Progressing 11/06/2018 0420 by Herbie Baltimore, RN Outcome: Progressing   Problem:  Coping: Goal: Level of anxiety will decrease 11/06/2018 0455 by Herbie Baltimore, RN Outcome: Progressing 11/06/2018 0420 by Herbie Baltimore, RN Outcome: Progressing   Problem: Elimination: Goal: Will not experience complications related to bowel motility 11/06/2018 0455 by Herbie Baltimore, RN Outcome: Progressing 11/06/2018 0420 by Herbie Baltimore, RN Outcome: Progressing Goal: Will not experience complications related to urinary retention 11/06/2018 0455 by Herbie Baltimore, RN Outcome: Progressing 11/06/2018 0420 by Herbie Baltimore, RN Outcome: Progressing   Problem: Pain Managment: Goal: General experience of comfort will improve 11/06/2018 0455 by Herbie Baltimore, RN Outcome: Progressing 11/06/2018 0420 by Herbie Baltimore, RN Outcome: Progressing   Problem: Safety: Goal: Ability to remain free from injury will improve 11/06/2018 0455 by Herbie Baltimore, RN Outcome: Progressing 11/06/2018 0420 by Herbie Baltimore, RN Outcome: Progressing   Problem: Skin Integrity: Goal: Risk for impaired skin integrity will decrease 11/06/2018 0455 by Herbie Baltimore, RN Outcome: Progressing 11/06/2018 0420 by Herbie Baltimore, RN Outcome: Progressing

## 2018-11-07 LAB — CBC
HCT: 35.9 % — ABNORMAL LOW (ref 36.0–46.0)
Hemoglobin: 11.6 g/dL — ABNORMAL LOW (ref 12.0–15.0)
MCH: 30.2 pg (ref 26.0–34.0)
MCHC: 32.3 g/dL (ref 30.0–36.0)
MCV: 93.5 fL (ref 80.0–100.0)
Platelets: 303 10*3/uL (ref 150–400)
RBC: 3.84 MIL/uL — AB (ref 3.87–5.11)
RDW: 13.5 % (ref 11.5–15.5)
WBC: 9.1 10*3/uL (ref 4.0–10.5)
nRBC: 0 % (ref 0.0–0.2)

## 2018-11-07 LAB — BASIC METABOLIC PANEL
ANION GAP: 5 (ref 5–15)
BUN: 11 mg/dL (ref 8–23)
CALCIUM: 8.1 mg/dL — AB (ref 8.9–10.3)
CO2: 33 mmol/L — AB (ref 22–32)
Chloride: 101 mmol/L (ref 98–111)
Creatinine, Ser: 0.7 mg/dL (ref 0.44–1.00)
GFR calc Af Amer: >60 mL/min (ref >60)
GFR calc non Af Amer: >60 mL/min (ref >60)
Glucose, Bld: 113 mg/dL — ABNORMAL HIGH (ref 70–99)
Potassium: 4.5 mmol/L (ref 3.5–5.1)
Sodium: 139 mmol/L (ref 135–145)

## 2018-11-07 LAB — PROCALCITONIN: Procalcitonin: <0.1 ng/mL

## 2018-11-07 MED ORDER — SODIUM CHLORIDE 0.9% FLUSH
3.0000 mL | Freq: Two times a day (BID) | INTRAVENOUS | Status: DC
  Administered 2018-11-07 – 2018-11-13 (×13): 3 mL via INTRAVENOUS

## 2018-11-07 NOTE — Progress Notes (Signed)
C.diff precautions continued.  Had medium size red bloody mucus stool incontinently. Denies pain. Unable to stand for PT today due to weakness.

## 2018-11-07 NOTE — Evaluation (Signed)
Physical Therapy Evaluation Patient Details Name: Olivia Rodriguez MRN: [id number] DOB: April 05, 1951 Today's Date: 11/07/2018   History of Present Illness  68 y.o. female s/p L THA anterior approach 10/06/18 was readmitted from SNF for c-diff symptoms, then elected to go home.  Pt did not get out of the car, returned immediately from home.  Stated it took 5 people to get her out of the car, but still wishes to return home.  PMHx:  atherosclerosis, OA, L THA with direct anterior approach.  Clinical Impression  Pt was seen for mobility and test of strength after readmission when her attempt to go directly home did not succeed.  Pt is still very much a candidate for SNF as she had become too weak to get herself up to stand.  Pt had become more ambulatory in her stay at SNF but is now understanding more what a struggle a direct trip home could be again.  Follow acutely to work on steps and gait as she is able to attempt, and monitor her for her precautions of WB and the tendency to lose her balance even sitting as she is still quite weak in her trunk.    Follow Up Recommendations SNF    Equipment Recommendations  None recommended by PT    Recommendations for Other Services       Precautions / Restrictions Precautions Precautions: Fall;Other (comment)(PWB 50% on L hip) Precaution Booklet Issued: Yes (comment) Precaution Comments: enteric Restrictions Weight Bearing Restrictions: Yes LLE Weight Bearing: Partial weight bearing LLE Partial Weight Bearing Percentage or Pounds: 50 Other Position/Activity Restrictions: for two more weeks per dr Rudene Christians last admit      Mobility  Bed Mobility Overal bed mobility: Needs Assistance Bed Mobility: Supine to Sit;Sit to Supine     Supine to sit: Mod assist Sit to supine: Mod assist   General bed mobility comments: pt declined standign  Transfers                 General transfer comment: declined  Ambulation/Gait             General  Gait Details: declined  Stairs            Wheelchair Mobility    Modified Rankin (Stroke Patients Only)       Balance Overall balance assessment: Needs assistance;History of Falls Sitting-balance support: Feet supported;Bilateral upper extremity supported Sitting balance-Leahy Scale: Fair         Standing balance comment: declined                             Pertinent Vitals/Pain Pain Assessment: Faces Faces Pain Scale: Hurts little more Pain Location: abdomen - stomach cramps/aches Pain Descriptors / Indicators: Cramping Pain Intervention(s): Monitored during session    Home Living Family/patient expects to be discharged to:: Private residence Living Arrangements: Alone Available Help at Discharge: Friend(s);Available 24 hours/day(daughter) Type of Home: House Home Access: Stairs to enter Entrance Stairs-Rails: Chemical engineer of Steps: 2 steps with R railing from back (where she parks the car) and 4-5 steps with L railing from front Home Layout: One level Home Equipment: Cane - single point;Grab bars - tub/shower;Adaptive equipment;Walker - 2 wheels      Prior Function Level of Independence: Needs assistance   Gait / Transfers Assistance Needed: SNF admission for short gait distances with RW  ADL's / Homemaking Assistance Needed: pt was in SNF for STR with staff assisting all dressing  and bathing  Comments: previously used [facility] but had falls at home     Hand Dominance   Dominant Hand: Right    Extremity/Trunk Assessment   Upper Extremity Assessment Upper Extremity Assessment: Generalized weakness    Lower Extremity Assessment Lower Extremity Assessment: Generalized weakness;LLE deficits/detail LLE Deficits / Details: s/p THA  LLE Coordination: decreased fine motor;decreased gross motor    Cervical / Trunk Assessment Cervical / Trunk Assessment: Normal  Communication   Communication: No difficulties  Cognition  Arousal/Alertness: Awake/alert Behavior During Therapy: WFL for tasks assessed/performed Overall Cognitive Status: Within Functional Limits for tasks assessed                                 General Comments: pt in agreement that her abilities are an issue to succeed at home      General Comments General comments (skin integrity, edema, etc.): pt is up to side of bed with strong encouragement     Exercises     Assessment/Plan    PT Assessment Patient needs continued PT services  PT Problem List Decreased strength;Decreased range of motion;Decreased activity tolerance;Decreased balance;Decreased mobility;Decreased coordination;Decreased knowledge of use of DME;Decreased safety awareness;Decreased knowledge of precautions;Obesity;Decreased skin integrity;Pain(abdominal pain with  c-diff)       PT Treatment Interventions DME instruction;Gait training;Stair training;Functional mobility training;Therapeutic activities;Therapeutic exercise;Balance training;Neuromuscular re-education;Patient/family education    PT Goals (Current goals can be found in the Care Plan section)  Acute Rehab PT Goals Patient Stated Goal: to go home PT Goal Formulation: With patient Time For Goal Achievement: 11/21/18 Potential to Achieve Goals: Fair    Frequency Min 2X/week   Barriers to discharge Inaccessible home environment;Decreased caregiver support home alone with stairs to enter    Co-evaluation               AM-PAC PT "6 Clicks" Mobility  Outcome Measure Help needed turning from your back to your side while in a flat bed without using bedrails?: A Lot Help needed moving from lying on your back to sitting on the side of a flat bed without using bedrails?: A Lot Help needed moving to and from a bed to a chair (including a wheelchair)?: A Lot Help needed standing up from a chair using your arms (e.g., wheelchair or bedside chair)?: A Lot Help needed to walk in hospital room?: A  Lot Help needed climbing 3-5 steps with a railing? : Total 6 Click Score: 11    End of Session Equipment Utilized During Treatment: Gait belt Activity Tolerance: Patient limited by fatigue;Patient limited by lethargy Patient left: in bed;with call bell/phone within reach;with bed alarm set Nurse Communication: Mobility status PT Visit Diagnosis: Muscle weakness (generalized) (M62.81);Difficulty in walking, not elsewhere classified (R26.2);History of falling (Z91.81) Pain - Right/Left: Left Pain - part of body: Hip    Time: 1610-9604 PT Time Calculation (min) (ACUTE ONLY): 23 min   Charges:   PT Evaluation $PT Eval Moderate Complexity: 1 Mod PT Treatments $Therapeutic Activity: 8-22 mins       Ramond Dial 11/07/2018, 12:43 PM  Mee Hives, PT MS Acute Rehab Dept. Number: [place] [facility] [place]

## 2018-11-07 NOTE — Progress Notes (Signed)
[facility] [place] NAME: Olivia Rodriguez    MR#:  [id number]  DATE OF BIRTH:  [birth year]  SUBJECTIVE:   Patient presents to the hospital due to weakness.  She was just recently discharged from the hospital after being treated for C. difficile diarrhea returns back due to generalized weakness and difficulty ambulating.  Patient says she still having some diarrhea and had 4 episodes yesterday.  None this morning.  No nausea or vomiting and tolerating p.o. well.  REVIEW OF SYSTEMS:    Review of Systems  Constitutional: Negative for chills and fever.  HENT: Negative for congestion and tinnitus.   Eyes: Negative for blurred vision and double vision.  Respiratory: Negative for cough, shortness of breath and wheezing.   Cardiovascular: Negative for chest pain, orthopnea and PND.  Gastrointestinal: Negative for abdominal pain, diarrhea, nausea and vomiting.  Genitourinary: Negative for dysuria and hematuria.  Neurological: Positive for weakness (generalized). Negative for dizziness, sensory change and focal weakness.  All other systems reviewed and are negative.   Nutrition: Heart Healthy Tolerating Diet: Yes Tolerating PT: Eval noted.  DRUG ALLERGIES:   Allergies  Allergen Reactions  . Penicillins Hives and Other (See Comments)    Did it involve swelling of the face/tongue/throat, SOB, or low BP? No Did it involve sudden or severe rash/hives, skin peeling, or any reaction on the inside of your mouth or nose? Yes Did you need to seek medical attention at a hospital or doctor's office? Yes When did it last happen? Over 60 years ago If all above answers are "NO", may proceed with cephalosporin use.     VITALS:  Blood pressure (!) 168/75, pulse 78, temperature 97.8 F (36.6 C), temperature source Oral, resp. rate 18, height 5\' 2"  (1.575 m), weight 115.7 kg, SpO2 97 %.  PHYSICAL EXAMINATION:   Physical Exam  GENERAL:  68 y.o.-year-old  obese patient lying in bed in no acute distress.  EYES: Pupils equal, round, reactive to light and accommodation. No scleral icterus. Extraocular muscles intact.  HEENT: Head atraumatic, normocephalic. Oropharynx and nasopharynx clear.  NECK:  Supple, no jugular venous distention. No thyroid enlargement, no tenderness.  LUNGS: Normal breath sounds bilaterally, no wheezing, rales, rhonchi. No use of accessory muscles of respiration.  CARDIOVASCULAR: S1, S2 normal. No murmurs, rubs, or gallops.  ABDOMEN: Soft, Nt, Distended, Hyperactive Bowel sounds present. No organomegaly or mass.  EXTREMITIES: No cyanosis, clubbing or edema b/l.    NEUROLOGIC: Cranial nerves II through XII are intact. No focal Motor or sensory deficits b/l.  Globally weak PSYCHIATRIC: The patient is alert and oriented x 3.  SKIN: No obvious rash, lesion, or ulcer.    LABORATORY PANEL:   CBC Recent Labs  Lab 11/07/18 0504  WBC 9.1  HGB 11.6*  HCT 35.9*  PLT 303   ------------------------------------------------------------------------------------------------------------------  Chemistries  Recent Labs  Lab 11/05/18 2020 11/07/18 0504  NA 135 139  K 3.2* 4.5  CL 97* 101  CO2 30 33*  GLUCOSE 132* 113*  BUN 14 11  CREATININE 0.83 0.70  CALCIUM 8.0* 8.1*  MG 2.1  --   AST 29  --   ALT 16  --   ALKPHOS 90  --   BILITOT 0.5  --    ------------------------------------------------------------------------------------------------------------------  Cardiac Enzymes Recent Labs  Lab 11/05/18 2020  TROPONINI 0.03*   ------------------------------------------------------------------------------------------------------------------  RADIOLOGY:  No results found.   ASSESSMENT AND PLAN:   68 year old female with past  medical history of obesity, osteoarthritis status post recent left hip replacement last month who presents to the hospital due to generalized weakness and diarrhea.  1.  C. difficile  diarrhea-this is the cause of patient's ongoing diarrhea. -Patient is clinically improving.  Continue Dificid.  Patient tolerating p.o. well.  Diarrhea has slowed down.  2.  Generalized weakness/difficulty walking-this is secondary to patient's deconditioning and prolonged previous hospitalization. - Seen by physical therapy and they recommend short-term rehab, will make social Work Aware.   3.  Urinary incontinence-continue Myrbetriq.  4.  Osteoarthritis-continue Robaxin, Tylenol as needed.   All the records are reviewed and case discussed with Care Management/Social Worker. Management plans discussed with the patient, family and they are in agreement.  CODE STATUS: Full code  DVT Prophylaxis: Lovenox  TOTAL TIME TAKING CARE OF THIS PATIENT: 30 minutes.   POSSIBLE D/C IN 1-2 DAYS, DEPENDING ON CLINICAL CONDITION.   Henreitta Leber M.D on 11/07/2018 at 1:02 PM  Between 7am to 6pm - Pager - [phone]  After 6pm go to [email or web address] - Proofreader  [facility]  Office  [phone]  CC: Primary care physician; Patient, No Pcp Per

## 2018-11-08 LAB — CALCIUM, IONIZED: Calcium, Ionized, Serum: 4.8 mg/dL (ref 4.5–5.6)

## 2018-11-08 MED ORDER — TRAMADOL HCL 50 MG PO TABS: 50.0000 mg | ORAL_TABLET | Freq: Four times a day (QID) | ORAL | Status: DC | PRN

## 2018-11-08 NOTE — Progress Notes (Signed)
Purewick removed d/t pt incontinent of loose stool. Pt educated on rationale of remove to include, not limited to, increased risk of infection of labial area or urinary tract.

## 2018-11-08 NOTE — NC FL2 (Signed)
[place] LEVEL OF CARE SCREENING TOOL     IDENTIFICATION  Patient Name: Olivia Rodriguez Birthdate: [birth year] Sex: female Admission Date (Current Location): 11/05/2018  [place] and [place] Number:  Engineering geologist and Address:  [facility], [address] [place], [place] [address]      Provider Number: [id number]  Attending Physician Name and Address:  Henreitta Leber, MD  Relative Name and Phone Number:  Harriett Azar (Daughter) [phone]    Current Level of Care: Hospital Recommended Level of Care: [place] Prior Approval Number:    Date Approved/Denied:   PASRR Number: [id number]  Discharge Plan: SNF    Current Diagnoses: Patient Active Problem List   Diagnosis Date Noted  . Clostridium difficile infection 11/05/2018  . Colitis 10/27/2018  . Status post total hip replacement, left 10/06/2018    Orientation RESPIRATION BLADDER Height & Weight     Self, Time, Situation, Place  Normal Continent Weight: 195 lb 1.7 oz (88.5 kg) Height:  5\' 2"  (157.5 cm)  BEHAVIORAL SYMPTOMS/MOOD NEUROLOGICAL BOWEL NUTRITION STATUS      Continent Diet(Heart healthy)  AMBULATORY STATUS COMMUNICATION OF NEEDS Skin   Extensive Assist Verbally Normal                       Personal Care Assistance Level of Assistance  Bathing, Feeding, Dressing Bathing Assistance: Limited assistance Feeding assistance: Independent Dressing Assistance: Limited assistance     Functional Limitations Info  Sight, Hearing, Speech Sight Info: Adequate Hearing Info: Adequate Speech Info: Adequate    SPECIAL CARE FACTORS FREQUENCY  PT (By licensed PT), OT (By licensed OT)     PT Frequency: 5X OT Frequency: 3X            Contractures Contractures Info: Not present    Additional Factors Info  Code Status, Allergies, Isolation Precautions Code Status Info: Full Allergies Info: Penicillins     Isolation Precautions Info:  C.Diff/Enteric Precautions     Current Medications (11/08/2018):  This is the current hospital active medication list Current Facility-Administered Medications  Medication Dose Route Frequency Provider Last Rate Last Dose  . acetaminophen (TYLENOL) tablet 650 mg  650 mg Oral Q6H PRN Arta Silence, MD       Or  . acetaminophen (TYLENOL) suppository 650 mg  650 mg Rectal Q6H PRN Arta Silence, MD      . enoxaparin (LOVENOX) injection 40 mg  40 mg Subcutaneous BID Arta Silence, MD   40 mg at 11/08/18 0957  . fidaxomicin (DIFICID) tablet 200 mg  200 mg Oral BID Arta Silence, MD   200 mg at 11/08/18 0958  . methocarbamol (ROBAXIN) tablet 500 mg  500 mg Oral Q6H PRN Arta Silence, MD      . mirabegron ER (MYRBETRIQ) tablet 25 mg  25 mg Oral Daily Arta Silence, MD   25 mg at 11/06/18 0831  . morphine 2 MG/ML injection 2 mg  2 mg Intravenous Q3H PRN Arta Silence, MD       Or  . morphine 4 MG/ML injection 4 mg  4 mg Intravenous Q3H PRN Arta Silence, MD      . ondansetron (ZOFRAN) tablet 4 mg  4 mg Oral Q6H PRN Arta Silence, MD       Or  . ondansetron (ZOFRAN) injection 4 mg  4 mg Intravenous Q6H PRN Arta Silence, MD   4 mg at 11/07/18 2152  . saccharomyces boulardii (FLORASTOR) capsule 250 mg  250 mg Oral BID Arta Silence, MD   250 mg at 11/08/18 0958  . sodium chloride flush (NS) 0.9 % injection 3 mL  3 mL Intravenous Q12H Henreitta Leber, MD   3 mL at 11/08/18 1000  . traMADol (ULTRAM) tablet 50 mg  50 mg Oral Q6H PRN Henreitta Leber, MD         Discharge Medications: Please see discharge summary for a list of discharge medications.  Relevant Imaging Results:  Relevant Lab Results:   Additional Information SS#[id number]  Zettie Pho, LCSW

## 2018-11-08 NOTE — TOC Initial Note (Signed)
Transition of Care [facility]) - Initial/Assessment Note    Patient Details  Name: Olivia Rodriguez MRN: [id number] Date of Birth: 1951-01-01  Transition of Care [facility]) CM/SW Contact:    Zettie Pho, LCSW Phone Number: 11/08/2018, 3:43 PM  Clinical Narrative:   The CSW met with the patient at bedside to introduce self and role in care. The CSW explained the PT recommendation and provided the patient with the list of SNFs for [facility] from [email or web address]. The patient verbalized permission to begin the referral process, and she indicated that she would not like to go to [facility], but she is interested in [facility]. The CSW explained the insurance authorization process. The patient shared that the original plan was for her to return home with resumption of care through [facility] and additional support from her daughter who was planning to take leave. The patient shared that that is now the plan after the patient discharges from SNF.   The CSW has begun the referral process. Bed offers will be presented once available. The patient will transition to SNF once authorization is received for the bed of her choice and once she is medically stable. [facility] team is following.                Expected Discharge Plan: Skilled Nursing Facility Barriers to Discharge: No Barriers Identified   Patient Goals and CMS Choice Patient states their goals for this hospitalization and ongoing recovery are:: "I want to get back home and keep working on my walking and safety." CMS Medicare.gov Compare Post Acute Care list provided to:: Patient Choice offered to / list presented to : Patient  Expected Discharge Plan and Services Expected Discharge Plan: [place] Discharge Planning Services: NA Post Acute Care Choice: NA Living arrangements for the past 2 months: Single Family Home                 DME Arranged: N/A DME Agency: NA HH Arranged: NA HH Agency: NA  Prior Living  Arrangements/Services Living arrangements for the past 2 months: Single Family Home Lives with:: Self Patient language and need for interpreter reviewed:: Yes Do you feel safe going back to the place where you live?: Yes      Need for Family Participation in Patient Care: No (Comment) Care giver support system in place?: Yes (comment) Current home services: Home PT, Home OT(None) Criminal Activity/Legal Involvement Pertinent to Current Situation/Hospitalization: No - Comment as needed  Activities of Daily Living Home Assistive Devices/Equipment: None, Walker (specify type) ADL Screening (condition at time of admission) Patient's cognitive ability adequate to safely complete daily activities?: Yes Is the patient deaf or have difficulty hearing?: No Does the patient have difficulty seeing, even when wearing glasses/contacts?: No Does the patient have difficulty concentrating, remembering, or making decisions?: No Patient able to express need for assistance with ADLs?: Yes Does the patient have difficulty dressing or bathing?: No Independently performs ADLs?: Yes (appropriate for developmental age) Does the patient have difficulty walking or climbing stairs?: No Weakness of Legs: None Weakness of Arms/Hands: None  Permission Sought/Granted Permission sought to share information with : Investment banker, corporate granted to share info w AGENCY: [facility] area SNFs except for [employer]        Emotional Assessment Appearance:: Appears stated age Attitude/Demeanor/Rapport: Gracious, Charismatic, Engaged Affect (typically observed): Pleasant Orientation: : Oriented to Self, Oriented to Place, Oriented to  Time, Oriented to Situation Alcohol /  Substance Use: Never Used Psych Involvement: No (comment)  Admission diagnosis:  C. difficile diarrhea [A04.72] Generalized weakness [R53.1] Patient Active Problem List   Diagnosis Date Noted  . Clostridium  difficile infection 11/05/2018  . Colitis 10/27/2018  . Status post total hip replacement, left 10/06/2018   PCP:  Patient, No Pcp Per Pharmacy:   [place], [place] - [address] [place] [address] Phone: [phone] Fax: [phone]  [place], [address] [place] [address] Phone: [phone] Fax: [phone]     Social Determinants of Health (SDOH) Interventions  None indicated -- patient denies food insecurity, housing needs, lack of basic needs, or difficulty accessing medications.  Readmission Risk Interventions 30 Day Unplanned Readmission Risk Score     ED to Hosp-Admission (Current) from 11/05/2018 in [place] (1C)  30 Day Unplanned Readmission Risk Score (%)  17 Filed at 11/08/2018 1200     This score is the patient's risk of an unplanned readmission within 30 days of being discharged (0 -100%). The score is based on dignosis, age, lab data, medications, orders, and past utilization.   Low:  0-14.9   Medium: 15-21.9   High: 22-29.9   Extreme: 30 and above       Readmission Risk Prevention Plan 11/08/2018  Transportation Screening Complete  PCP or Specialist Appt within 5-7 Days Not Complete  Not Complete comments Will address when able  Home Care Screening Complete  Medication Review (RN CM) Complete

## 2018-11-08 NOTE — Progress Notes (Signed)
[place] at [place] NAME: Olivia Rodriguez    MR#:  [id number]  DATE OF BIRTH:  Jun 08, 1951  SUBJECTIVE:   Patient still having some loose stools but improved overall.  No nausea or vomiting and tolerating p.o. well.  Remains afebrile.  Still complains of significant generalized weakness.  REVIEW OF SYSTEMS:    Review of Systems  Constitutional: Negative for chills and fever.  HENT: Negative for congestion and tinnitus.   Eyes: Negative for blurred vision and double vision.  Respiratory: Negative for cough, shortness of breath and wheezing.   Cardiovascular: Negative for chest pain, orthopnea and PND.  Gastrointestinal: Negative for abdominal pain, diarrhea, nausea and vomiting.  Genitourinary: Negative for dysuria and hematuria.  Neurological: Positive for weakness (generalized). Negative for dizziness, sensory change and focal weakness.  All other systems reviewed and are negative.   Nutrition: Heart Healthy Tolerating Diet: Yes Tolerating PT: Eval noted.  DRUG ALLERGIES:   Allergies  Allergen Reactions  . Penicillins Hives and Other (See Comments)    Did it involve swelling of the face/tongue/throat, SOB, or low BP? No Did it involve sudden or severe rash/hives, skin peeling, or any reaction on the inside of your mouth or nose? Yes Did you need to seek medical attention at a hospital or doctor's office? Yes When did it last happen? Over 60 years ago If all above answers are "NO", may proceed with cephalosporin use.     VITALS:  Blood pressure 128/66, pulse 84, temperature 98.2 F (36.8 C), temperature source Oral, resp. rate 20, height 5\' 2"  (1.575 m), weight 88.5 kg, SpO2 96 %.  PHYSICAL EXAMINATION:   Physical Exam  GENERAL:  68 y.o.-year-old obese patient lying in bed in no acute distress.  EYES: Pupils equal, round, reactive to light and accommodation. No scleral icterus. Extraocular muscles intact.  HEENT: Head  atraumatic, normocephalic. Oropharynx and nasopharynx clear.  NECK:  Supple, no jugular venous distention. No thyroid enlargement, no tenderness.  LUNGS: Normal breath sounds bilaterally, no wheezing, rales, rhonchi. No use of accessory muscles of respiration.  CARDIOVASCULAR: S1, S2 normal. No murmurs, rubs, or gallops.  ABDOMEN: Soft, NT, Distended, Hyperactive Bowel sounds present. No organomegaly or mass.  EXTREMITIES: No cyanosis, clubbing or edema b/l.    NEUROLOGIC: Cranial nerves II through XII are intact. No focal Motor or sensory deficits b/l.  Globally weak PSYCHIATRIC: The patient is alert and oriented x 3.  SKIN: No obvious rash, lesion, or ulcer.    LABORATORY PANEL:   CBC Recent Labs  Lab 11/07/18 0504  WBC 9.1  HGB 11.6*  HCT 35.9*  PLT 303   ------------------------------------------------------------------------------------------------------------------  Chemistries  Recent Labs  Lab 11/05/18 2020 11/07/18 0504  NA 135 139  K 3.2* 4.5  CL 97* 101  CO2 30 33*  GLUCOSE 132* 113*  BUN 14 11  CREATININE 0.83 0.70  CALCIUM 8.0* 8.1*  MG 2.1  --   AST 29  --   ALT 16  --   ALKPHOS 90  --   BILITOT 0.5  --    ------------------------------------------------------------------------------------------------------------------  Cardiac Enzymes Recent Labs  Lab 11/05/18 2020  TROPONINI 0.03*   ------------------------------------------------------------------------------------------------------------------  RADIOLOGY:  No results found.   ASSESSMENT AND PLAN:   68 year old female with past medical history of obesity, osteoarthritis status post recent left hip replacement last month who presents to the hospital due to generalized weakness and diarrhea.  1.  C. difficile diarrhea-this is the  cause of patient's ongoing diarrhea. - Continue Dificid and diarrhea improving.  -  Patient tolerating p.o. well.  Diarrhea has slowed down.  2.  Generalized  weakness/difficulty walking-this is secondary to patient's deconditioning and prolonged previous hospitalization. - Seen by physical therapy and they recommend short-term rehab and social work aware.   3.  Urinary incontinence-continue Myrbetriq.  4.  Osteoarthritis-continue Robaxin, Tylenol as needed.  Possible discharge to skilled nursing facility next 1 to 2 days  All the records are reviewed and case discussed with Care Management/Social Worker. Management plans discussed with the patient, family and they are in agreement.  CODE STATUS: Full code  DVT Prophylaxis: Lovenox  TOTAL TIME TAKING CARE OF THIS PATIENT: 30 minutes.   POSSIBLE D/C IN 1-2 DAYS, DEPENDING ON CLINICAL CONDITION.   Henreitta Leber M.D on 11/08/2018 at 12:17 PM  Between 7am to 6pm - Pager - [phone]  After 6pm go to [email or web address] - Proofreader  [facility]  Office  [phone]  CC: Primary care physician; Patient, No Pcp Per

## 2018-11-08 NOTE — Progress Notes (Signed)
Toes on both feet dusky with coolness/cold to touch. Positive bilateral pedal pulses with doppler. Pt refuses TED and socks on feet. One very large loose brown stool with no visible blood. Refused prostat protein supplement and scheduled tramadol- MD notified with orders updated. Remains at bedrest.

## 2018-11-09 LAB — CBC
HCT: 34.7 % — ABNORMAL LOW (ref 36.0–46.0)
Hemoglobin: 11.3 g/dL — ABNORMAL LOW (ref 12.0–15.0)
MCH: 29.7 pg (ref 26.0–34.0)
MCHC: 32.6 g/dL (ref 30.0–36.0)
MCV: 91.1 fL (ref 80.0–100.0)
Platelets: 243 10*3/uL (ref 150–400)
RBC: 3.81 MIL/uL — AB (ref 3.87–5.11)
RDW: 13.7 % (ref 11.5–15.5)
WBC: 10.2 10*3/uL (ref 4.0–10.5)
nRBC: 0 % (ref 0.0–0.2)

## 2018-11-09 LAB — BASIC METABOLIC PANEL
Anion gap: 8 (ref 5–15)
BUN: 18 mg/dL (ref 8–23)
CO2: 27 mmol/L (ref 22–32)
Calcium: 8.1 mg/dL — ABNORMAL LOW (ref 8.9–10.3)
Chloride: 103 mmol/L (ref 98–111)
Creatinine, Ser: 0.49 mg/dL (ref 0.44–1.00)
GFR calc Af Amer: >60 mL/min (ref >60)
GFR calc non Af Amer: >60 mL/min (ref >60)
Glucose, Bld: 101 mg/dL — ABNORMAL HIGH (ref 70–99)
Potassium: 4.7 mmol/L (ref 3.5–5.1)
Sodium: 138 mmol/L (ref 135–145)

## 2018-11-09 MED ORDER — ENOXAPARIN SODIUM 40 MG/0.4ML ~~LOC~~ SOLN
40.0000 mg | SUBCUTANEOUS | Status: DC
  Administered 2018-11-10 – 2018-11-13 (×4): 40 mg via SUBCUTANEOUS
  Filled 2018-11-09 (×4): qty 0.4

## 2018-11-09 MED ORDER — HYDROCHLOROTHIAZIDE 12.5 MG PO CAPS
12.5000 mg | ORAL_CAPSULE | Freq: Every day | ORAL | Status: DC
  Administered 2018-11-09 – 2018-11-13 (×5): 12.5 mg via ORAL
  Filled 2018-11-09 (×5): qty 1

## 2018-11-09 NOTE — Plan of Care (Signed)
P.t saw and worked with pt in bed this pm.  2 loose  musy stools this shift medium size.  Waiting to go to snf at discharge

## 2018-11-09 NOTE — Care Management Important Message (Signed)
Important Message  Patient Details  Name: Olivia Rodriguez MRN: [id number] Date of Birth: [birth year]   Medicare Important Message Given:  Yes    Shelbie Ammons, RN 11/09/2018, 1:39 PM

## 2018-11-09 NOTE — Progress Notes (Signed)
[place] at [place] NAME: Olivia Rodriguez    MR#:  [id number]  DATE OF BIRTH:  June 21, 1951  SUBJECTIVE:   Patient still having some loose stools but improved overall.  No nausea or vomiting and tolerating p.o. well.  Remains afebrile.  Still complains of significant generalized weakness.  Stool is soft now but have frequency.  REVIEW OF SYSTEMS:    Review of Systems  Constitutional: Negative for chills and fever.  HENT: Negative for congestion and tinnitus.   Eyes: Negative for blurred vision and double vision.  Respiratory: Negative for cough, shortness of breath and wheezing.   Cardiovascular: Negative for chest pain, orthopnea and PND.  Gastrointestinal: Negative for abdominal pain, diarrhea, nausea and vomiting.  Genitourinary: Negative for dysuria and hematuria.  Neurological: Positive for weakness (generalized). Negative for dizziness, sensory change and focal weakness.  All other systems reviewed and are negative.   Nutrition: Heart Healthy Tolerating Diet: Yes Tolerating PT: Eval noted.  DRUG ALLERGIES:   Allergies  Allergen Reactions  . Penicillins Hives and Other (See Comments)    Did it involve swelling of the face/tongue/throat, SOB, or low BP? No Did it involve sudden or severe rash/hives, skin peeling, or any reaction on the inside of your mouth or nose? Yes Did you need to seek medical attention at a hospital or doctor's office? Yes When did it last happen? Over 60 years ago If all above answers are "NO", may proceed with cephalosporin use.     VITALS:  Blood pressure (!) 143/59, pulse 73, temperature 98.3 F (36.8 C), temperature source Oral, resp. rate 20, height 5\' 2"  (1.575 m), weight 88.5 kg, SpO2 99 %.  PHYSICAL EXAMINATION:   Physical Exam  GENERAL:  68 y.o.-year-old obese patient lying in bed in no acute distress.  EYES: Pupils equal, round, reactive to light and accommodation. No scleral icterus.  Extraocular muscles intact.  HEENT: Head atraumatic, normocephalic. Oropharynx and nasopharynx clear.  NECK:  Supple, no jugular venous distention. No thyroid enlargement, no tenderness.  LUNGS: Normal breath sounds bilaterally, no wheezing, rales, rhonchi. No use of accessory muscles of respiration.  CARDIOVASCULAR: S1, S2 normal. No murmurs, rubs, or gallops.  ABDOMEN: Soft, NT, Distended, Hyperactive Bowel sounds present. No organomegaly or mass.  EXTREMITIES: No cyanosis, clubbing or edema b/l.    NEUROLOGIC: Cranial nerves II through XII are intact. No focal Motor or sensory deficits b/l.  Globally weak PSYCHIATRIC: The patient is alert and oriented x 3.  SKIN: No obvious rash, lesion, or ulcer.    LABORATORY PANEL:   CBC Recent Labs  Lab 11/09/18 0508  WBC 10.2  HGB 11.3*  HCT 34.7*  PLT 243   ------------------------------------------------------------------------------------------------------------------  Chemistries  Recent Labs  Lab 11/05/18 2020  11/09/18 0508  NA 135   < > 138  K 3.2*   < > 4.7  CL 97*   < > 103  CO2 30   < > 27  GLUCOSE 132*   < > 101*  BUN 14   < > 18  CREATININE 0.83   < > 0.49  CALCIUM 8.0*   < > 8.1*  MG 2.1  --   --   AST 29  --   --   ALT 16  --   --   ALKPHOS 90  --   --   BILITOT 0.5  --   --    < > = values in this interval not  displayed.   ------------------------------------------------------------------------------------------------------------------  Cardiac Enzymes Recent Labs  Lab 11/05/18 2020  TROPONINI 0.03*   ------------------------------------------------------------------------------------------------------------------  RADIOLOGY:  No results found.   ASSESSMENT AND PLAN:   68 year old female with past medical history of obesity, osteoarthritis status post recent left hip replacement last month who presents to the hospital due to generalized weakness and diarrhea.  1.  C. difficile diarrhea-this is the  cause of patient's ongoing diarrhea. - Continue Dificid and diarrhea improving.  -  Patient tolerating p.o. well.  Diarrhea has slowed down.  2.  Generalized weakness/difficulty walking-this is secondary to patient's deconditioning and prolonged previous hospitalization. - Seen by physical therapy and they recommend short-term rehab and social work aware.   3.  Urinary incontinence-continue Myrbetriq.  4.  Osteoarthritis-continue Robaxin, Tylenol as needed.  Possible discharge to skilled nursing facility next 1 to 2 days  All the records are reviewed and case discussed with Care Management/Social Worker. Management plans discussed with the patient, family and they are in agreement.  CODE STATUS: Full code  DVT Prophylaxis: Lovenox  TOTAL TIME TAKING CARE OF THIS PATIENT: 32 minutes.   POSSIBLE D/C IN 1-2 DAYS, DEPENDING ON CLINICAL CONDITION.   Vaughan Basta M.D on 11/09/2018 at 4:56 PM  Between 7am to 6pm - Pager - [phone]  After 6pm go to [email or web address] - Proofreader  [facility] [place] [facility]  Office  [phone]  CC: Primary care physician; Patient, No Pcp Per

## 2018-11-09 NOTE — TOC Progression Note (Signed)
Transition of Care [facility]) - Progression Note    Patient Details  Name: Olivia Rodriguez MRN: [id number] Date of Birth: [birth year]  Transition of Care [facility]) CM/SW [place], [place] Phone Number: 11/09/2018, 2:15 PM  Clinical Narrative:   CSW spoke with patient and she is still agreeable to SNF. Patient chose bed at [facility]. CSW notified Claiborne Billings at [facility] of bed acceptance. Claiborne Billings will begin [facility]. CSW will continue to follow.     Expected Discharge Plan: [place] Barriers to Discharge: No Barriers Identified  Expected Discharge Plan and Services Expected Discharge Plan: [place] Discharge Planning Services: NA Post Acute Care Choice: NA Living arrangements for the past 2 months: Single Family Home                 DME Arranged: N/A DME Agency: NA HH Arranged: NA HH Agency: NA   Social Determinants of Health (SDOH) Interventions    Readmission Risk Interventions 30 Day Unplanned Readmission Risk Score     ED to Hosp-Admission (Current) from 11/05/2018 in [place] (1C)  30 Day Unplanned Readmission Risk Score (%)  16 Filed at 11/09/2018 1200     This score is the patient's risk of an unplanned readmission within 30 days of being discharged (0 -100%). The score is based on dignosis, age, lab data, medications, orders, and past utilization.   Low:  0-14.9   Medium: 15-21.9   High: 22-29.9   Extreme: 30 and above       Readmission Risk Prevention Plan 11/08/2018  Transportation Screening Complete  PCP or Specialist Appt within 5-7 Days Not Complete  Not Complete comments Will address when able  Home Care Screening Complete  Medication Review (RN CM) Complete

## 2018-11-09 NOTE — Progress Notes (Signed)
Physical Therapy Treatment Patient Details Name: Olivia Rodriguez MRN: [id number] DOB: March 31, 1951 Today's Date: 11/09/2018    History of Present Illness 68 y.o. female s/p L THA anterior approach 10/06/18 was readmitted from SNF for c-diff symptoms, then elected to go home.  Pt did not get out of the car, returned immediately from home.  Stated it took 5 people to get her out of the car, but still wishes to return home.  PMHx:  atherosclerosis, OA, L THA with direct anterior approach.    PT Comments    Pt reporting being too weak to attempt sitting on edge of bed or any OOB mobility but with encouragement agreeable to ex's in bed.  Pt performed B LE ex's in bed but overall limited d/t abdominal cramping pain (7-8/10); nurse present and notified.  Will continue to progress pt with strengthening and progressive functional mobility per pt tolerance.    Follow Up Recommendations  SNF     Equipment Recommendations  Rolling walker with 5" wheels;3in1 (PT)    Recommendations for Other Services OT consult     Precautions / Restrictions Precautions Precautions: Fall Restrictions Weight Bearing Restrictions: Yes LLE Weight Bearing: Partial weight bearing LLE Partial Weight Bearing Percentage or Pounds: 50 Other Position/Activity Restrictions: for two more weeks per dr Rudene Christians last admit    Mobility  Bed Mobility               General bed mobility comments: Pt declined to attempt d/t feeling too weak  Transfers                 General transfer comment: Pt declined to attempt d/t feeling too weak  Ambulation/Gait             General Gait Details: Pt declined to attempt d/t feeling too weak   Stairs             Wheelchair Mobility    Modified Rankin (Stroke Patients Only)       Balance                                            Cognition Arousal/Alertness: Awake/alert Behavior During Therapy: WFL for tasks assessed/performed Overall  Cognitive Status: Within Functional Limits for tasks assessed                                        Exercises Total Joint Exercises Ankle Circles/Pumps: AROM;Strengthening;Both;10 reps;Supine Quad Sets: AROM;Strengthening;Both;10 reps;Supine Short Arc Quad: AROM;Strengthening;Both;10 reps;Supine Heel Slides: AAROM;Strengthening;Both;10 reps;Supine Hip ABduction/ADduction: AAROM;Strengthening;Both;10 reps;Supine    General Comments   Nursing cleared pt for participation in physical therapy.  Pt agreeable to limited PT session.      Pertinent Vitals/Pain Pain Assessment: 0-10 Pain Score: 7  Pain Location: abdomen - stomach cramps/aches Pain Descriptors / Indicators: Grimacing;Cramping Pain Intervention(s): Limited activity within patient's tolerance;Monitored during session;Repositioned;Other (comment)(RN present and notified)    Home Living                      Prior Function            PT Goals (current goals can now be found in the care plan section) Acute Rehab PT Goals Patient Stated Goal: to improve strength and mobility PT Goal Formulation: With patient Time  For Goal Achievement: 11/21/18 Potential to Achieve Goals: Fair Progress towards PT goals: Progressing toward goals(with strengthening)    Frequency    Min 2X/week      PT Plan Current plan remains appropriate    Co-evaluation              AM-PAC PT "6 Clicks" Mobility   Outcome Measure  Help needed turning from your back to your side while in a flat bed without using bedrails?: A Lot Help needed moving from lying on your back to sitting on the side of a flat bed without using bedrails?: A Lot Help needed moving to and from a bed to a chair (including a wheelchair)?: A Lot Help needed standing up from a chair using your arms (e.g., wheelchair or bedside chair)?: A Lot Help needed to walk in hospital room?: Total Help needed climbing 3-5 steps with a railing? : Total 6  Click Score: 10    End of Session Equipment Utilized During Treatment: Gait belt Activity Tolerance: Patient limited by pain Patient left: in bed;with call bell/phone within reach;with bed alarm set;with nursing/sitter in room;Other (comment)(B heels elevated via pillow) Nurse Communication: Mobility status PT Visit Diagnosis: Muscle weakness (generalized) (M62.81);Difficulty in walking, not elsewhere classified (R26.2);History of falling (Z91.81) Pain - Right/Left: Left Pain - part of body: Hip     Time: 5170-0174 PT Time Calculation (min) (ACUTE ONLY): 18 min  Charges:  $Therapeutic Exercise: 8-22 mins                    Leitha Bleak, PT 11/09/18, 3:34 PM [phone]

## 2018-11-10 DIAGNOSIS — A0472 Enterocolitis due to Clostridium difficile, not specified as recurrent: Secondary | ICD-10-CM

## 2018-11-10 NOTE — Clinical Social Work Note (Signed)
CSW received a phone call from [facility] to give authorization for patient to go to [facility]. Auth number [id number] good for 7 days. CSW will continue to follow for discharge planning.   [place], [place]

## 2018-11-10 NOTE — Progress Notes (Signed)
[facility] [place] NAME: Olivia Rodriguez    MR#:  [id number]  DATE OF BIRTH:  [birth year]  SUBJECTIVE:   Patient still having some loose stools but improved overall.  No nausea or vomiting and tolerating p.o. well.  Remains afebrile.  Still complains of significant generalized weakness.  Stool is watery and have frequency. hAVE CRAMPS AND GAS IN ABDOMEN.  REVIEW OF SYSTEMS:    Review of Systems  Constitutional: Negative for chills and fever.  HENT: Negative for congestion and tinnitus.   Eyes: Negative for blurred vision and double vision.  Respiratory: Negative for cough, shortness of breath and wheezing.   Cardiovascular: Negative for chest pain, orthopnea and PND.  Gastrointestinal: Negative for abdominal pain, diarrhea, nausea and vomiting.  Genitourinary: Negative for dysuria and hematuria.  Neurological: Positive for weakness (generalized). Negative for dizziness, sensory change and focal weakness.  All other systems reviewed and are negative.   Nutrition: Heart Healthy Tolerating Diet: Yes Tolerating PT: Eval noted.  DRUG ALLERGIES:   Allergies  Allergen Reactions  . Penicillins Hives and Other (See Comments)    Did it involve swelling of the face/tongue/throat, SOB, or low BP? No Did it involve sudden or severe rash/hives, skin peeling, or any reaction on the inside of your mouth or nose? Yes Did you need to seek medical attention at a hospital or doctor's office? Yes When did it last happen? Over 60 years ago If all above answers are "NO", may proceed with cephalosporin use.     VITALS:  Blood pressure (!) 153/59, pulse 72, temperature 97.7 F (36.5 C), temperature source Oral, resp. rate (!) 22, height 5\' 2"  (1.575 m), weight 88.5 kg, SpO2 96 %.  PHYSICAL EXAMINATION:   Physical Exam  GENERAL:  68 y.o.-year-old obese patient lying in bed in no acute distress.  EYES: Pupils equal, round, reactive to light and  accommodation. No scleral icterus. Extraocular muscles intact.  HEENT: Head atraumatic, normocephalic. Oropharynx and nasopharynx clear.  NECK:  Supple, no jugular venous distention. No thyroid enlargement, no tenderness.  LUNGS: Normal breath sounds bilaterally, no wheezing, rales, rhonchi. No use of accessory muscles of respiration.  CARDIOVASCULAR: S1, S2 normal. No murmurs, rubs, or gallops.  ABDOMEN: Soft, NT, Distended, Hyperactive Bowel sounds present. No organomegaly or mass.  EXTREMITIES: No cyanosis, clubbing or edema b/l.    NEUROLOGIC: Cranial nerves II through XII are intact. No focal Motor or sensory deficits b/l.  Globally weak PSYCHIATRIC: The patient is alert and oriented x 3.  SKIN: No obvious rash, lesion, or ulcer.    LABORATORY PANEL:   CBC Recent Labs  Lab 11/09/18 0508  WBC 10.2  HGB 11.3*  HCT 34.7*  PLT 243   ------------------------------------------------------------------------------------------------------------------  Chemistries  Recent Labs  Lab 11/05/18 2020  11/09/18 0508  NA 135   < > 138  K 3.2*   < > 4.7  CL 97*   < > 103  CO2 30   < > 27  GLUCOSE 132*   < > 101*  BUN 14   < > 18  CREATININE 0.83   < > 0.49  CALCIUM 8.0*   < > 8.1*  MG 2.1  --   --   AST 29  --   --   ALT 16  --   --   ALKPHOS 90  --   --   BILITOT 0.5  --   --    < > =  values in this interval not displayed.   ------------------------------------------------------------------------------------------------------------------  Cardiac Enzymes Recent Labs  Lab 11/05/18 2020  TROPONINI 0.03*   ------------------------------------------------------------------------------------------------------------------  RADIOLOGY:  No results found.   ASSESSMENT AND PLAN:   68 year old female with past medical history of obesity, osteoarthritis status post recent left hip replacement last month who presents to the hospital due to generalized weakness and diarrhea.  1.   C. difficile diarrhea-this is the cause of patient's ongoing diarrhea. - Continue Dificid .  -  Patient tolerating p.o. well.  Diarrhea has not slowed down.  She already was treated with oral vanc and now deficid for last > 1 week. Called GI consult.  2.  Generalized weakness/difficulty walking-this is secondary to patient's deconditioning and prolonged previous hospitalization. - Seen by physical therapy and they recommend short-term rehab and social work aware.   3.  Urinary incontinence-continue Myrbetriq.  4.  Osteoarthritis-continue Robaxin, Tylenol as needed.  Possible discharge to skilled nursing facility next 2-3 days  All the records are reviewed and case discussed with Care Management/Social Worker. Management plans discussed with the patient, family and they are in agreement.  CODE STATUS: Full code  DVT Prophylaxis: Lovenox  TOTAL TIME TAKING CARE OF THIS PATIENT: 32 minutes.   POSSIBLE D/C IN 1-2 DAYS, DEPENDING ON CLINICAL CONDITION.   Vaughan Basta M.D on 11/10/2018 at 3:32 PM  Between 7am to 6pm - Pager - [phone]  After 6pm go to [email or web address] - Proofreader  [facility]  Office  [phone]  CC: Primary care physician; Patient, No Pcp Per

## 2018-11-10 NOTE — Consult Note (Signed)
Lucilla Lame, MD [facility]  [address] [place], [place] [address] Phone: [phone] Fax : [phone]  Consultation  Referring Provider:     Dr. Anselm Jungling Primary Care Physician:  Patient, No Pcp Per Primary Gastroenterologist:  Dr. Vicente Males         Reason for Consultation:     C. difficile colitis  Date of Admission:  11/05/2018 Date of Consultation:  11/10/2018         HPI:   Olivia Rodriguez is a 68 y.o. female who has a history of having a hip replacement.  The patient then went into rehab and started to have an upper respiratory tract infection.  The patient was treated with antibiotics and developed C. difficile colitis.  The patient was C. difficile back on the beginning of March and was seen by Dr. Vicente Males.  The patient was then followed by Dr. Marius Ditch and Dr. Bonna Gains.  The patient was treated with vancomycin and discharged.  The patient came back with continued diarrhea and altered mental status with weakness.  She was then sent home with Dificid and reports that she has been taking it for over a week with continued abdominal pain and diarrhea.  The patient states that she is eating well but continues to have watery diarrhea.   Past Medical History:  Diagnosis Date  . Arthritis     Past Surgical History:  Procedure Laterality Date  . CESAREAN SECTION    . REFRACTIVE SURGERY Bilateral 1999  . TONSILLECTOMY    . TOTAL HIP ARTHROPLASTY Left 10/06/2018   Procedure: TOTAL HIP ARTHROPLASTY ANTERIOR APPROACH-LEFT;  Surgeon: Hessie Knows, MD;  Location: [facility] ORS;  Service: Orthopedics;  Laterality: Left;    Prior to Admission medications   Medication Sig Start Date End Date Taking? Authorizing Provider  acetaminophen (TYLENOL) 325 MG tablet Take 1-2 tablets (325-650 mg total) by mouth every 6 (six) hours as needed for mild pain (pain score 1-3 or temp > 100.5). 10/08/18  Yes Duanne Guess, PA-C  Amino Acids-Protein Hydrolys (FEEDING SUPPLEMENT, PRO-STAT SUGAR FREE 64,) LIQD Take  30 mLs by mouth 2 (two) times daily between meals.   Yes [provider]  benzonatate (TESSALON) 100 MG capsule Take 100 mg by mouth 3 (three) times daily as needed for cough.   Yes [provider]  docusate sodium (COLACE) 100 MG capsule Take 1 capsule (100 mg total) by mouth 2 (two) times daily. 10/08/18  Yes Duanne Guess, PA-C  fidaxomicin (DIFICID) 200 MG TABS tablet Take 1 tablet (200 mg total) by mouth 2 (two) times daily. 11/02/18  Yes Wieting, Richard, MD  fluticasone (FLONASE) 50 MCG/ACT nasal spray Place 2 sprays into both nostrils daily.   Yes [provider]  hydrochlorothiazide (HYDRODIURIL) 12.5 MG tablet Take 1 tablet (12.5 mg total) by mouth daily for 30 days. 11/05/18 12/05/18 Yes Pyreddy, Reatha Harps, MD  loratadine (CLARITIN) 10 MG tablet Take 10 mg by mouth at bedtime.   Yes [provider]  methocarbamol (ROBAXIN) 500 MG tablet Take 1 tablet (500 mg total) by mouth every 6 (six) hours as needed for muscle spasms. 10/08/18  Yes Duanne Guess, PA-C  mirabegron ER (MYRBETRIQ) 25 MG TB24 tablet Take 25 mg by mouth daily.   Yes [provider]  Multiple Vitamin (MULTIVITAMIN WITH MINERALS) TABS tablet Take 1 tablet by mouth daily.   Yes [provider]  ondansetron (ZOFRAN) 4 MG tablet Take 1 tablet (4 mg total) by mouth every 6 (  six) hours as needed for nausea. 11/05/18  Yes Pyreddy, Reatha Harps, MD  saccharomyces boulardii (FLORASTOR) 250 MG capsule Take 250 mg by mouth 2 (two) times daily.   Yes [provider]  traMADol (ULTRAM) 50 MG tablet Take 50 mg by mouth 4 (four) times daily.   Yes [provider]  vitamin C (ASCORBIC ACID) 250 MG tablet Take 250 mg by mouth 2 (two) times daily.   Yes [provider]  zinc sulfate 220 (50 Zn) MG capsule Take 220 mg by mouth daily.   Yes [provider]    History reviewed. No pertinent family history.   Social History   Tobacco Use  . Smoking status: Never  Smoker  . Smokeless tobacco: Never Used  Substance Use Topics  . Alcohol use: Not Currently    Comment: Occasional wine or mixed drink  . Drug use: Never    Allergies as of 11/05/2018 - Review Complete 11/05/2018  Allergen Reaction Noted  . Penicillins Hives and Other (See Comments) 09/17/2018    Review of Systems:    All systems reviewed and negative except where noted in HPI.   Physical Exam:  Vital signs in last 24 hours: Temp:  [97.7 F (36.5 C)-98.4 F (36.9 C)] 97.7 F (36.5 C) (03/17 0816) Pulse Rate:  [72-78] 72 (03/17 0816) Resp:  [18-22] 22 (03/17 0816) BP: (139-153)/(59-61) 153/59 (03/17 0816) SpO2:  [94 %-100 %] 96 % (03/17 0816) Last BM Date: 11/10/18 General:   Pleasant, cooperative in NAD Head:  Normocephalic and atraumatic. Eyes:   No icterus.   Conjunctiva pink. PERRLA. Ears:  Normal auditory acuity. Neck:  Supple; no masses or thyroidomegaly Lungs: Respirations even and unlabored. Lungs clear to auscultation bilaterally.   No wheezes, crackles, or rhonchi.  Heart:  Regular rate and rhythm;  Without murmur, clicks, rubs or gallops Abdomen:  Soft, nondistended, nontender. Normal bowel sounds. No appreciable masses or hepatomegaly.  No rebound or guarding.  Rectal:  Not performed. Msk:  Symmetrical without gross deformities.    Extremities:  Without edema, cyanosis or clubbing. Neurologic:  Alert and oriented x3;  grossly normal neurologically. Skin:  Intact without significant lesions or rashes. Cervical Nodes:  No significant cervical adenopathy. Psych:  Alert and cooperative. Normal affect.  LAB RESULTS: Recent Labs    11/09/18 0508  WBC 10.2  HGB 11.3*  HCT 34.7*  PLT 243   BMET Recent Labs    11/09/18 0508  NA 138  K 4.7  CL 103  CO2 27  GLUCOSE 101*  BUN 18  CREATININE 0.49  CALCIUM 8.1*   LFT No results for input(s): PROT, ALBUMIN, AST, ALT, ALKPHOS, BILITOT, BILIDIR, IBILI in the last 72 hours. PT/INR No results for input(s):  LABPROT, INR in the last 72 hours.  STUDIES: No results found.    Impression / Plan:   Assessment: Active Problems:   Clostridium difficile infection   Olivia Rodriguez is a 68 y.o. y/o female with C. difficile colitis who has not responded to vancomycin and Dificid.  The patient continues to have profuse diarrhea which she reports to be watery and abdominal discomfort.  Plan: The patient will have her stools checked for C. difficile and a GI panel for other possible cause of her diarrhea.  If it is positive then the patient has been explained that the next step would be a stool transplant.  I spoke to Dr. Vicente Males who is aware of the situation and will be involved with the stool transplant.  We will await the results of her stool studies.  Thank you for involving me in the care of this patient.      LOS: 5 days   Lucilla Lame, MD  11/10/2018, 1:32 PM    Note: This dictation was prepared with Dragon dictation along with smaller phrase technology. Any transcriptional errors that result from this process are unintentional.

## 2018-11-11 LAB — GASTROINTESTINAL PANEL BY PCR, STOOL (REPLACES STOOL CULTURE)
Adenovirus F40/41: NOT DETECTED
Astrovirus: NOT DETECTED
Campylobacter species: NOT DETECTED
Cryptosporidium: NOT DETECTED
Cyclospora cayetanensis: NOT DETECTED
ENTAMOEBA HISTOLYTICA: NOT DETECTED
Enteroaggregative E coli (EAEC): NOT DETECTED
Enteropathogenic E coli (EPEC): NOT DETECTED
Enterotoxigenic E coli (ETEC): NOT DETECTED
Giardia lamblia: NOT DETECTED
Norovirus GI/GII: NOT DETECTED
Plesimonas shigelloides: NOT DETECTED
Rotavirus A: NOT DETECTED
SHIGA LIKE TOXIN PRODUCING E COLI (STEC): NOT DETECTED
Salmonella species: NOT DETECTED
Sapovirus (I, II, IV, and V): NOT DETECTED
Shigella/Enteroinvasive E coli (EIEC): NOT DETECTED
Vibrio cholerae: NOT DETECTED
Vibrio species: NOT DETECTED
Yersinia enterocolitica: NOT DETECTED

## 2018-11-11 LAB — C DIFFICILE QUICK SCREEN W PCR REFLEX
C Diff antigen: NEGATIVE
C Diff interpretation: NOT DETECTED
C Diff toxin: NEGATIVE

## 2018-11-11 MED ORDER — DICYCLOMINE HCL 20 MG PO TABS
20.0000 mg | ORAL_TABLET | Freq: Three times a day (TID) | ORAL | Status: DC
  Administered 2018-11-11 – 2018-11-13 (×7): 20 mg via ORAL
  Filled 2018-11-11 (×8): qty 1

## 2018-11-11 NOTE — Progress Notes (Addendum)
Olivia Lame, MD [facility]   [address], [place], [place] [address] Phone: [phone] Fax : [phone]   Subjective: This patient was admitted with recurrent C. difficile and has her stools checked yesterday for other pathogens and C. difficile.  The stool studies came back negative for everything and her C. difficile was also negative.  The patient states that her diarrhea has decreased dramatically but she still has some diffuse abdominal cramps.   Objective: Vital signs in last 24 hours: Vitals:   11/10/18 0816 11/10/18 1547 11/10/18 2041 11/11/18 0444  BP: (!) 153/59 (!) 148/66 (!) 156/64 (!) 149/60  Pulse: 72 73 77 69  Resp: (!) 22 18 17 18   Temp: 97.7 F (36.5 C) 98.1 F (36.7 C) 98.6 F (37 C) 97.6 F (36.4 C)  TempSrc: Oral  Oral Oral  SpO2: 96% 97% 97% 97%  Weight:      Height:       Weight change:   Intake/Output Summary (Last 24 hours) at 11/11/2018 1355 Last data filed at 11/11/2018 0954 Gross per 24 hour  Intake -  Output 1100 ml  Net -1100 ml     Exam: Heart:: Regular rate and rhythm, S1S2 present or without murmur or extra heart sounds Lungs: normal and clear to auscultation and percussion Abdomen: soft, nontender, normal bowel sounds   Lab Results: @LABTEST2 @ Micro Results: Recent Results (from the past 240 hour(s))  Gastrointestinal Panel by PCR , Stool     Status: None   Collection Time: 11/11/18  4:45 AM  Result Value Ref Range Status   Campylobacter species NOT DETECTED NOT DETECTED Final   Plesimonas shigelloides NOT DETECTED NOT DETECTED Final   Salmonella species NOT DETECTED NOT DETECTED Final   Yersinia enterocolitica NOT DETECTED NOT DETECTED Final   Vibrio species NOT DETECTED NOT DETECTED Final   Vibrio cholerae NOT DETECTED NOT DETECTED Final   Enteroaggregative E coli (EAEC) NOT DETECTED NOT DETECTED Final   Enteropathogenic E coli (EPEC) NOT DETECTED NOT DETECTED Final   Enterotoxigenic E coli (ETEC) NOT DETECTED NOT  DETECTED Final   Shiga like toxin producing E coli (STEC) NOT DETECTED NOT DETECTED Final   Shigella/Enteroinvasive E coli (EIEC) NOT DETECTED NOT DETECTED Final   Cryptosporidium NOT DETECTED NOT DETECTED Final   Cyclospora cayetanensis NOT DETECTED NOT DETECTED Final   Entamoeba histolytica NOT DETECTED NOT DETECTED Final   Giardia lamblia NOT DETECTED NOT DETECTED Final   Adenovirus F40/41 NOT DETECTED NOT DETECTED Final   Astrovirus NOT DETECTED NOT DETECTED Final   Norovirus GI/GII NOT DETECTED NOT DETECTED Final   Rotavirus A NOT DETECTED NOT DETECTED Final   Sapovirus (I, II, IV, and V) NOT DETECTED NOT DETECTED Final    Comment: Performed at [facility], [address] [place], [place] [address]  C difficile quick scan w PCR reflex     Status: None   Collection Time: 11/11/18  4:45 AM  Result Value Ref Range Status   C Diff antigen NEGATIVE NEGATIVE Final   C Diff toxin NEGATIVE NEGATIVE Final   C Diff interpretation No C. difficile detected.  Final    Comment: Performed at [facility], [address] [place], [place] [address]   Studies/Results: No results found. Medications: I have reviewed the patient's current medications. Scheduled Meds: . dicyclomine  20 mg Oral TID  . enoxaparin (LOVENOX) injection  40 mg Subcutaneous Q24H  . fidaxomicin  200 mg Oral BID  . hydrochlorothiazide  12.5 mg  Oral Daily  . mirabegron ER  25 mg Oral Daily  . saccharomyces boulardii  250 mg Oral BID  . sodium chloride flush  3 mL Intravenous Q12H   Continuous Infusions: PRN Meds:.acetaminophen **OR** acetaminophen, methocarbamol, morphine injection **OR** morphine injection, ondansetron **OR** ondansetron (ZOFRAN) IV, traMADol   Assessment: Active Problems:   Clostridium difficile infection   C. difficile diarrhea    Plan: This patient has been treated with Dificid for her C. difficile colitis and is now negative for the virus.  I would recommend continuing  the patient on her medication for at least 2 weeks after discharge from the hospital.  The patient does not need any further investigation at this time since her bowel movements have decreased dramatically and her C. difficile is negative.  The patient will be started on an antispasmodic to help with her abdominal discomfort.  Otherwise nothing further to do from a GI point of view. I will sign off.  Please call if any further GI concerns or questions.  We would like to thank you for the opportunity to participate in the care of [facility].     LOS: 6 days   Olivia Rodriguez 11/11/2018, 1:55 PM

## 2018-11-11 NOTE — Progress Notes (Signed)
[place] [facility] [place] NAME: Olivia Rodriguez    MR#:  [id number]  DATE OF BIRTH:  Dec 06, 1950  SUBJECTIVE:   Patient still having some loose stools but improved overall.  No nausea or vomiting and tolerating p.o. well.  Remains afebrile.  Still complains of significant generalized weakness.  Stool is forming up and have less frequency. hAVE CRAMPS AND GAS IN ABDOMEN.  REVIEW OF SYSTEMS:    Review of Systems  Constitutional: Negative for chills and fever.  HENT: Negative for congestion and tinnitus.   Eyes: Negative for blurred vision and double vision.  Respiratory: Negative for cough, shortness of breath and wheezing.   Cardiovascular: Negative for chest pain, orthopnea and PND.  Gastrointestinal: Negative for abdominal pain, diarrhea, nausea and vomiting.  Genitourinary: Negative for dysuria and hematuria.  Neurological: Positive for weakness (generalized). Negative for dizziness, sensory change and focal weakness.  All other systems reviewed and are negative.   Nutrition: Heart Healthy Tolerating Diet: Yes Tolerating PT: Eval noted.  DRUG ALLERGIES:   Allergies  Allergen Reactions  . Penicillins Hives and Other (See Comments)    Did it involve swelling of the face/tongue/throat, SOB, or low BP? No Did it involve sudden or severe rash/hives, skin peeling, or any reaction on the inside of your mouth or nose? Yes Did you need to seek medical attention at a hospital or doctor's office? Yes When did it last happen? Over 60 years ago If all above answers are "NO", may proceed with cephalosporin use.     VITALS:  Blood pressure (!) 148/61, pulse 80, temperature (!) 97.5 F (36.4 C), temperature source Oral, resp. rate 18, height 5\' 2"  (1.575 m), weight 88.5 kg, SpO2 98 %.  PHYSICAL EXAMINATION:   Physical Exam  GENERAL:  68 y.o.-year-old obese patient lying in bed in no acute distress.  EYES: Pupils equal, round, reactive to light  and accommodation. No scleral icterus. Extraocular muscles intact.  HEENT: Head atraumatic, normocephalic. Oropharynx and nasopharynx clear.  NECK:  Supple, no jugular venous distention. No thyroid enlargement, no tenderness.  LUNGS: Normal breath sounds bilaterally, no wheezing, rales, rhonchi. No use of accessory muscles of respiration.  CARDIOVASCULAR: S1, S2 normal. No murmurs, rubs, or gallops.  ABDOMEN: Soft, NT, Distended, Hyperactive Bowel sounds present. No organomegaly or mass.  EXTREMITIES: No cyanosis, clubbing or edema b/l.    NEUROLOGIC: Cranial nerves II through XII are intact. No focal Motor or sensory deficits b/l.  Globally weak PSYCHIATRIC: The patient is alert and oriented x 3.  SKIN: No obvious rash, lesion, or ulcer.    LABORATORY PANEL:   CBC Recent Labs  Lab 11/09/18 0508  WBC 10.2  HGB 11.3*  HCT 34.7*  PLT 243   ------------------------------------------------------------------------------------------------------------------  Chemistries  Recent Labs  Lab 11/05/18 2020  11/09/18 0508  NA 135   < > 138  K 3.2*   < > 4.7  CL 97*   < > 103  CO2 30   < > 27  GLUCOSE 132*   < > 101*  BUN 14   < > 18  CREATININE 0.83   < > 0.49  CALCIUM 8.0*   < > 8.1*  MG 2.1  --   --   AST 29  --   --   ALT 16  --   --   ALKPHOS 90  --   --   BILITOT 0.5  --   --    < > =  values in this interval not displayed.   ------------------------------------------------------------------------------------------------------------------  Cardiac Enzymes Recent Labs  Lab 11/05/18 2020  TROPONINI 0.03*   ------------------------------------------------------------------------------------------------------------------  RADIOLOGY:  No results found.   ASSESSMENT AND PLAN:   68 year old female with past medical history of obesity, osteoarthritis status post recent left hip replacement last month who presents to the hospital due to generalized weakness and  diarrhea.  1.  C. difficile diarrhea-this is the cause of patient's ongoing diarrhea. - Continue Dificid .  -  Patient tolerating p.o. well.  Diarrhea has not slowed down.  She already was treated with oral vanc and now deficid for last > 1 week. Called GI consult. GI had sent repeat stool studies and C. difficile which came negative.,  Patient's diarrhea is slightly better so suggested to give antispasmodic for the abdominal pain and continue Dificid for 2 more weeks.  2.  Generalized weakness/difficulty walking-this is secondary to patient's deconditioning and prolonged previous hospitalization. - Seen by physical therapy and they recommend short-term rehab and social work aware.   3.  Urinary incontinence-continue Myrbetriq.  4.  Osteoarthritis-continue Robaxin, Tylenol as needed.  Possible discharge to skilled nursing facility next 2-3 days  All the records are reviewed and case discussed with Care Management/Social Worker. Management plans discussed with the patient, family and they are in agreement.  CODE STATUS: Full code  DVT Prophylaxis: Lovenox  TOTAL TIME TAKING CARE OF THIS PATIENT: 32 minutes.   POSSIBLE D/C IN 1-2 DAYS, DEPENDING ON CLINICAL CONDITION.   Vaughan Basta M.D on 11/11/2018 at 5:11 PM  Between 7am to 6pm - Pager - [phone]  After 6pm go to [email or web address] - Proofreader  [facility]  Office  [phone]  CC: Primary care physician; Patient, No Pcp Per

## 2018-11-11 NOTE — Care Management Important Message (Signed)
Important Message  Patient Details  Name: Verma Grothaus MRN: [id number] Date of Birth: 1951-04-07   Medicare Important Message Given:  Yes    Ontario Pettengill A Esli Clements, RN 11/11/2018, 12:14 PM

## 2018-11-11 NOTE — Progress Notes (Signed)
Per IP, patient is to stay on enteric precautions for the remainder of the admission. Madlyn Frankel, RN

## 2018-11-12 MED ORDER — SACCHAROMYCES BOULARDII 250 MG PO CAPS: 250.0000 mg | ORAL_CAPSULE | Freq: Two times a day (BID) | ORAL | Status: DC

## 2018-11-12 NOTE — Progress Notes (Signed)
[facility] [place] NAME: Olivia Rodriguez    MR#:  [id number]  DATE OF BIRTH:  [birth year]  SUBJECTIVE:   Patient still having some loose stools but improved overall.  No nausea or vomiting and tolerating p.o. well.  Remains afebrile.  Still complains of significant generalized weakness.  Stool is forming up and have less frequency. hAVE CRAMPS AND GAS IN ABDOMEN. Had only one bowel movement since last night.  REVIEW OF SYSTEMS:    Review of Systems  Constitutional: Negative for chills and fever.  HENT: Negative for congestion and tinnitus.   Eyes: Negative for blurred vision and double vision.  Respiratory: Negative for cough, shortness of breath and wheezing.   Cardiovascular: Negative for chest pain, orthopnea and PND.  Gastrointestinal: Negative for abdominal pain, diarrhea, nausea and vomiting.  Genitourinary: Negative for dysuria and hematuria.  Neurological: Positive for weakness (generalized). Negative for dizziness, sensory change and focal weakness.  All other systems reviewed and are negative.   Nutrition: Heart Healthy Tolerating Diet: Yes Tolerating PT: Eval noted.  DRUG ALLERGIES:   Allergies  Allergen Reactions  . Penicillins Hives and Other (See Comments)    Did it involve swelling of the face/tongue/throat, SOB, or low BP? No Did it involve sudden or severe rash/hives, skin peeling, or any reaction on the inside of your mouth or nose? Yes Did you need to seek medical attention at a hospital or doctor's office? Yes When did it last happen? Over 60 years ago If all above answers are "NO", may proceed with cephalosporin use.     VITALS:  Blood pressure (!) 159/75, pulse 76, temperature 98.7 F (37.1 C), temperature source Oral, resp. rate 14, height 5\' 2"  (1.575 m), weight 88.5 kg, SpO2 95 %.  PHYSICAL EXAMINATION:   Physical Exam  GENERAL:  68 y.o.-year-old obese patient lying in bed in no acute distress.   EYES: Pupils equal, round, reactive to light and accommodation. No scleral icterus. Extraocular muscles intact.  HEENT: Head atraumatic, normocephalic. Oropharynx and nasopharynx clear.  NECK:  Supple, no jugular venous distention. No thyroid enlargement, no tenderness.  LUNGS: Normal breath sounds bilaterally, no wheezing, rales, rhonchi. No use of accessory muscles of respiration.  CARDIOVASCULAR: S1, S2 normal. No murmurs, rubs, or gallops.  ABDOMEN: Soft, NT, Distended, Hyperactive Bowel sounds present. No organomegaly or mass.  EXTREMITIES: No cyanosis, clubbing or edema b/l.    NEUROLOGIC: Cranial nerves II through XII are intact. No focal Motor or sensory deficits b/l.  Globally weak PSYCHIATRIC: The patient is alert and oriented x 3.  SKIN: No obvious rash, lesion, or ulcer.    LABORATORY PANEL:   CBC Recent Labs  Lab 11/09/18 0508  WBC 10.2  HGB 11.3*  HCT 34.7*  PLT 243   ------------------------------------------------------------------------------------------------------------------  Chemistries  Recent Labs  Lab 11/05/18 2020  11/09/18 0508  NA 135   < > 138  K 3.2*   < > 4.7  CL 97*   < > 103  CO2 30   < > 27  GLUCOSE 132*   < > 101*  BUN 14   < > 18  CREATININE 0.83   < > 0.49  CALCIUM 8.0*   < > 8.1*  MG 2.1  --   --   AST 29  --   --   ALT 16  --   --   ALKPHOS 90  --   --   BILITOT 0.5  --   --    < > =  values in this interval not displayed.   ------------------------------------------------------------------------------------------------------------------  Cardiac Enzymes Recent Labs  Lab 11/05/18 2020  TROPONINI 0.03*   ------------------------------------------------------------------------------------------------------------------  RADIOLOGY:  No results found.   ASSESSMENT AND PLAN:   67 year old female with past medical history of obesity, osteoarthritis status post recent left hip replacement last month who presents to the  hospital due to generalized weakness and diarrhea.  1.  C. difficile diarrhea-this is the cause of patient's ongoing diarrhea. - Continue Dificid .  -  Patient tolerating p.o. well.  Diarrhea has not slowed down.  She already was treated with oral vanc and now deficid for last > 1 week. Called GI consult. GI had sent repeat stool studies and C. difficile which came negative.,  Patient's diarrhea is slightly better so suggested to give antispasmodic for the abdominal pain and continue Dificid for 2 more weeks.  Patient had 2 bowel movements yesterday and one watery bowel movement this morning so overall improving frequency wise.  2.  Generalized weakness/difficulty walking-this is secondary to patient's deconditioning and prolonged previous hospitalization. - Seen by physical therapy and they recommend short-term rehab and social work aware.   3.  Urinary incontinence-continue Myrbetriq.  4.  Osteoarthritis-continue Robaxin, Tylenol as needed.  Possible discharge to skilled nursing facility next 2-3 days  All the records are reviewed and case discussed with Care Management/Social Worker. Management plans discussed with the patient, family and they are in agreement.  CODE STATUS: Full code  DVT Prophylaxis: Lovenox  TOTAL TIME TAKING CARE OF THIS PATIENT: 32 minutes.   POSSIBLE D/C IN 1-2 DAYS, DEPENDING ON CLINICAL CONDITION.   Vaughan Basta M.D on 11/12/2018 at 4:47 PM  Between 7am to 6pm - Pager - [phone]  After 6pm go to [email or web address] - Proofreader  [facility]  Office  [phone]  CC: Primary care physician; Patient, No Pcp Per

## 2018-11-12 NOTE — Progress Notes (Signed)
Assumed patient care at 2300, patient in no distress throughout the night; no pain, VSS and adequate urine output.

## 2018-11-12 NOTE — Progress Notes (Signed)
PT Cancellation Note  Patient Details Name: Olivia Rodriguez MRN: [id number] DOB: [birth year]   Cancelled Treatment:    Reason Eval/Treat Not Completed: Patient declined, no reason specified;Other (comment). Treatment attempted; pt strongly declines. Pt states "I don't want to do therapy at all; I am too weak and even sitting edge of the bed is too hard and makes me feel terrible". "I am going to a rehab place tomorrow, and I will try to do therapy there". Pt refuses anything at bed level as well. Re attempt at a later date continuing to educate on slowly building strength.    Larae Grooms, PTA 11/12/2018, 1:43 PM

## 2018-11-13 MED ORDER — LOPERAMIDE HCL 2 MG PO CAPS
2.0000 mg | ORAL_CAPSULE | Freq: Once | ORAL | Status: AC
  Administered 2018-11-13: 2 mg via ORAL
  Filled 2018-11-13: qty 1

## 2018-11-13 MED ORDER — LOPERAMIDE HCL 2 MG PO CAPS: 2.0000 mg | ORAL_CAPSULE | Freq: Three times a day (TID) | ORAL | 0 refills | Status: DC | PRN

## 2018-11-13 MED ORDER — TRAMADOL HCL 50 MG PO TABS: 50.0000 mg | ORAL_TABLET | Freq: Four times a day (QID) | ORAL | 0 refills | Status: DC

## 2018-11-13 MED ORDER — DICYCLOMINE HCL 20 MG PO TABS: 20.0000 mg | ORAL_TABLET | Freq: Three times a day (TID) | ORAL | 0 refills | Status: DC

## 2018-11-13 MED ORDER — FIDAXOMICIN 200 MG PO TABS: 200.0000 mg | ORAL_TABLET | Freq: Two times a day (BID) | ORAL | 0 refills | Status: AC

## 2018-11-13 NOTE — Care Management Important Message (Signed)
Important Message  Patient Details  Name: Olivia Rodriguez MRN: [id number] Date of Birth: 03/05/1951   Medicare Important Message Given:  Yes; Telephone conversation with Ms. Eriyonna Matsushita, RN 11/13/2018, 12:02 PM

## 2018-11-13 NOTE — TOC Transition Note (Signed)
Transition of Care [facility]) - CM/SW Discharge Note   Patient Details  Name: Olivia Rodriguez MRN: [id number] Date of Birth: May 30, 1951  Transition of Care [facility]) CM/SW Contact:  Annamaria Boots, [place] Phone Number: 11/13/2018, 2:29 PM   Clinical Narrative:   Patient is medically ready for discharge today to [facility]. Patient notified of discharge. Patient is in agreement. CSW notified Claiborne Billings at [facility]. Patient will be transported by EMS. RN to call report and call for transport.     Final next level of care: Skilled Nursing Facility Barriers to Discharge: No Barriers Identified   Patient Goals and CMS Choice Patient states their goals for this hospitalization and ongoing recovery are:: I want to go to rehab  CMS Medicare.gov Compare Post Acute Care list provided to:: Patient Choice offered to / list presented to : Patient  Discharge Placement PASRR number recieved: 11/08/18 Existing PASRR number confirmed : 11/08/18          Patient chooses bed at: [facility] Patient to be transferred to facility by: EMS Name of family member notified: Patient  Patient and family notified of of transfer: 11/13/18  Discharge Plan and Services   Discharge Planning Services: NA Post Acute Care Choice: NA          DME Arranged: N/A DME Agency: NA HH Arranged: NA HH Agency: NA   Social Determinants of Health ([place]) Interventions     Readmission Risk Interventions Readmission Risk Prevention Plan 11/08/2018  Transportation Screening Complete  PCP or Specialist Appt within 5-7 Days Not Complete  Not Complete comments Will address when able  Home Care Screening Complete  Medication Review (RN CM) Complete

## 2018-11-13 NOTE — Discharge Summary (Signed)
[place] at [place] NAME: Olivia Rodriguez    MR#:  [id number]  DATE OF BIRTH:  1950/10/16  DATE OF ADMISSION:  11/05/2018 ADMITTING PHYSICIAN: Arta Silence, MD  DATE OF DISCHARGE: 11/13/2018   PRIMARY CARE PHYSICIAN: Patient, No Pcp Per    ADMISSION DIAGNOSIS:  C. difficile diarrhea [A04.72] Generalized weakness [R53.1]  DISCHARGE DIAGNOSIS:  Active Problems:   Clostridium difficile infection   C. difficile diarrhea   SECONDARY DIAGNOSIS:   Past Medical History:  Diagnosis Date  . Arthritis     HOSPITAL COURSE:   68 year old female with past medical history of obesity, osteoarthritis status post recent left hip replacement last month who presents to the hospital due to generalized weakness and diarrhea.  1.  C. difficile diarrhea-this is the cause of patient's ongoing diarrhea. - Continue Dificid .  -  Patient tolerating p.o. well.  Diarrhea has not slowed down.  She already was treated with oral vanc and now deficid for last > 1 week. Called GI consult. GI had sent repeat stool studies and C. difficile which came negative.,  Patient's diarrhea is slightly better so suggested to give antispasmodic for the abdominal pain and continue Dificid for 2 more weeks.  Patient had 2 bowel movements yesterday and one watery bowel movement this morning so overall improving frequency wise. Give imodium as c diff negative now.  2.  Generalized weakness/difficulty walking-this is secondary to patient's deconditioning and prolonged previous hospitalization. - Seen by physical therapy and they recommend short-term rehab and social work aware.   3.  Urinary incontinence-continue Myrbetriq.  4.  Osteoarthritis-continue Robaxin, Tylenol as needed.  DISCHARGE CONDITIONS:   Stable.  CONSULTS OBTAINED:  Treatment Team:  Arta Silence, MD  DRUG ALLERGIES:   Allergies  Allergen Reactions  . Penicillins Hives and Other  (See Comments)    Did it involve swelling of the face/tongue/throat, SOB, or low BP? No Did it involve sudden or severe rash/hives, skin peeling, or any reaction on the inside of your mouth or nose? Yes Did you need to seek medical attention at a hospital or doctor's office? Yes When did it last happen? Over 60 years ago If all above answers are "NO", may proceed with cephalosporin use.     DISCHARGE MEDICATIONS:   Allergies as of 11/13/2018      Reactions   Penicillins Hives, Other (See Comments)   Did it involve swelling of the face/tongue/throat, SOB, or low BP? No Did it involve sudden or severe rash/hives, skin peeling, or any reaction on the inside of your mouth or nose? Yes Did you need to seek medical attention at a hospital or doctor's office? Yes When did it last happen? Over 60 years ago If all above answers are "NO", may proceed with cephalosporin use.      Medication List    STOP taking these medications   docusate sodium 100 MG capsule Commonly known as:  COLACE     TAKE these medications   acetaminophen 325 MG tablet Commonly known as:  TYLENOL Take 1-2 tablets (325-650 mg total) by mouth every 6 (six) hours as needed for mild pain (pain score 1-3 or temp > 100.5).   benzonatate 100 MG capsule Commonly known as:  TESSALON Take 100 mg by mouth 3 (three) times daily as needed for cough.   dicyclomine 20 MG tablet Commonly known as:  BENTYL Take 1 tablet (20 mg total) by mouth 3 (three) times daily.  feeding supplement (PRO-STAT SUGAR FREE 64) Liqd Take 30 mLs by mouth 2 (two) times daily between meals.   fidaxomicin 200 MG Tabs tablet Commonly known as:  Dificid Take 1 tablet (200 mg total) by mouth 2 (two) times daily for 12 days.   fluticasone 50 MCG/ACT nasal spray Commonly known as:  FLONASE Place 2 sprays into both nostrils daily.   hydrochlorothiazide 12.5 MG tablet Commonly known as:  HYDRODIURIL Take 1 tablet (12.5 mg total) by mouth daily  for 30 days.   loperamide 2 MG capsule Commonly known as:  IMODIUM Take 1 capsule (2 mg total) by mouth 3 (three) times daily as needed for diarrhea or loose stools.   loratadine 10 MG tablet Commonly known as:  CLARITIN Take 10 mg by mouth at bedtime.   methocarbamol 500 MG tablet Commonly known as:  ROBAXIN Take 1 tablet (500 mg total) by mouth every 6 (six) hours as needed for muscle spasms.   multivitamin with minerals Tabs tablet Take 1 tablet by mouth daily.   Myrbetriq 25 MG Tb24 tablet Generic drug:  mirabegron ER Take 25 mg by mouth daily.   ondansetron 4 MG tablet Commonly known as:  ZOFRAN Take 1 tablet (4 mg total) by mouth every 6 (six) hours as needed for nausea.   saccharomyces boulardii 250 MG capsule Commonly known as:  FLORASTOR Take 250 mg by mouth 2 (two) times daily.   traMADol 50 MG tablet Commonly known as:  ULTRAM Take 1 tablet (50 mg total) by mouth 4 (four) times daily.   vitamin C 250 MG tablet Commonly known as:  ASCORBIC ACID Take 250 mg by mouth 2 (two) times daily.   zinc sulfate 220 (50 Zn) MG capsule Take 220 mg by mouth daily.        DISCHARGE INSTRUCTIONS:    Follow with PMD in 1-2 weeks.  If you experience worsening of your admission symptoms, develop shortness of breath, life threatening emergency, suicidal or homicidal thoughts you must seek medical attention immediately by calling 911 or calling your MD immediately  if symptoms less severe.  You Must read complete instructions/literature along with all the possible adverse reactions/side effects for all the Medicines you take and that have been prescribed to you. Take any new Medicines after you have completely understood and accept all the possible adverse reactions/side effects.   Please note  You were cared for by a hospitalist during your hospital stay. If you have any questions about your discharge medications or the care you received while you were in the hospital  after you are discharged, you can call the unit and asked to speak with the hospitalist on call if the hospitalist that took care of you is not available. Once you are discharged, your primary care physician will handle any further medical issues. Please note that NO REFILLS for any discharge medications will be authorized once you are discharged, as it is imperative that you return to your primary care physician (or establish a relationship with a primary care physician if you do not have one) for your aftercare needs so that they can reassess your need for medications and monitor your lab values.    Today   CHIEF COMPLAINT:   Chief Complaint  Patient presents with  . Altered Mental Status  . Weakness    HISTORY OF PRESENT ILLNESS:  Wyllow Seigler  is a 68 y.o. female with a known history of HTN, DJD/OA p/w diarrhea. She states she was fine until  she recently had a L hip replacement. She states that she was offered the option to go home w/ Home P/T, but decided to go to [facility] instead. She states while she was there, she developed URI/bronchitis, and was treated w/ Azithromycin x5d. She states she subsequently developed diarrhea and weakness. She was admitted to [facility] from 03/02/202 to 11/05/2018 w/ clostridium difficile colitis, AKI and dehydration. She says she was started on Dificid and D/Ced home on 11/05/2018. She states her diarrhea was improving, but did not completely abate prior to going home. She states the diarrhea got worse at home. She endorses blood streaks in the stool and on toilet paper, but denies gross red blood in toilet bowl. She did take a dose of Dificid on Thursday (11/05/2018) morning. She endorses fatigue/malaise/generalized weakness, progressively worsening over the course of the day. She states she was unable to get out of the car without assistance after arriving back to the ED. Had multiple bowel movements while in the car. Endorses dehydration. Endorses lower  abdominal cramping, but denies frank AP. Denies fever, chills, diaphoresis, night sweats, rigors. Appears miserable but not toxic/diaphoretic. WBC 19.7, tachypneic, SIRS (+), (+) GI source; meets criteria for sepsis.   VITAL SIGNS:  Blood pressure (!) 146/68, pulse 66, temperature (!) 97.5 F (36.4 C), temperature source Oral, resp. rate 18, height 5\' 2"  (1.575 m), weight 88.5 kg, SpO2 (!) 88 %.  I/O:    Intake/Output Summary (Last 24 hours) at 11/13/2018 1421 Last data filed at 11/13/2018 1401 Gross per 24 hour  Intake -  Output 2800 ml  Net -2800 ml    PHYSICAL EXAMINATION:   GENERAL:  68 y.o.-year-old obese patient lying in bed in no acute distress.  EYES: Pupils equal, round, reactive to light and accommodation. No scleral icterus. Extraocular muscles intact.  HEENT: Head atraumatic, normocephalic. Oropharynx and nasopharynx clear.  NECK:  Supple, no jugular venous distention. No thyroid enlargement, no tenderness.  LUNGS: Normal breath sounds bilaterally, no wheezing, rales, rhonchi. No use of accessory muscles of respiration.  CARDIOVASCULAR: S1, S2 normal. No murmurs, rubs, or gallops.  ABDOMEN: Soft, NT, Distended, Hyperactive Bowel sounds present. No organomegaly or mass.  EXTREMITIES: No cyanosis, clubbing or edema b/l.    NEUROLOGIC: Cranial nerves II through XII are intact. No focal Motor or sensory deficits b/l.  Globally weak PSYCHIATRIC: The patient is alert and oriented x 3.  SKIN: No obvious rash, lesion, or ulcer.    DATA REVIEW:   CBC Recent Labs  Lab 11/09/18 0508  WBC 10.2  HGB 11.3*  HCT 34.7*  PLT 243    Chemistries  Recent Labs  Lab 11/09/18 0508  NA 138  K 4.7  CL 103  CO2 27  GLUCOSE 101*  BUN 18  CREATININE 0.49  CALCIUM 8.1*    Cardiac Enzymes No results for input(s): TROPONINI in the last 168 hours.  Microbiology Results  Results for orders placed or performed during the hospital encounter of 11/05/18  Gastrointestinal Panel by  PCR , Stool     Status: None   Collection Time: 11/11/18  4:45 AM  Result Value Ref Range Status   Campylobacter species NOT DETECTED NOT DETECTED Final   Plesimonas shigelloides NOT DETECTED NOT DETECTED Final   Salmonella species NOT DETECTED NOT DETECTED Final   Yersinia enterocolitica NOT DETECTED NOT DETECTED Final   Vibrio species NOT DETECTED NOT DETECTED Final   Vibrio cholerae NOT DETECTED NOT DETECTED Final   Enteroaggregative E coli (EAEC)  NOT DETECTED NOT DETECTED Final   Enteropathogenic E coli (EPEC) NOT DETECTED NOT DETECTED Final   Enterotoxigenic E coli (ETEC) NOT DETECTED NOT DETECTED Final   Shiga like toxin producing E coli (STEC) NOT DETECTED NOT DETECTED Final   Shigella/Enteroinvasive E coli (EIEC) NOT DETECTED NOT DETECTED Final   Cryptosporidium NOT DETECTED NOT DETECTED Final   Cyclospora cayetanensis NOT DETECTED NOT DETECTED Final   Entamoeba histolytica NOT DETECTED NOT DETECTED Final   Giardia lamblia NOT DETECTED NOT DETECTED Final   Adenovirus F40/41 NOT DETECTED NOT DETECTED Final   Astrovirus NOT DETECTED NOT DETECTED Final   Norovirus GI/GII NOT DETECTED NOT DETECTED Final   Rotavirus A NOT DETECTED NOT DETECTED Final   Sapovirus (I, II, IV, and V) NOT DETECTED NOT DETECTED Final    Comment: Performed at [facility], [address] [place], [place] [address]  C difficile quick scan w PCR reflex     Status: None   Collection Time: 11/11/18  4:45 AM  Result Value Ref Range Status   C Diff antigen NEGATIVE NEGATIVE Final   C Diff toxin NEGATIVE NEGATIVE Final   C Diff interpretation No C. difficile detected.  Final    Comment: Performed at [facility], [address] [place], [place] [address]    RADIOLOGY:  No results found.  EKG:   Orders placed or performed during the hospital encounter of 11/05/18  . ED EKG  . ED EKG  . EKG 12-Lead  . EKG 12-Lead      Management plans discussed with the patient, family and they  are in agreement.  CODE STATUS:     Code Status Orders  (From admission, onward)         Start     Ordered   11/05/18 2353  Full code  Continuous     11/05/18 2352        Code Status History    Date Active Date Inactive Code Status Order ID Comments User Context   10/27/2018 0135 11/05/2018 1916 Full Code [id number]  Sedalia Muta, MD ED   10/06/2018 1550 10/09/2018 2338 Full Code [id number]  Hessie Knows, MD Inpatient      TOTAL TIME TAKING CARE OF THIS PATIENT: 35 minutes.    Vaughan Basta M.D on 11/13/2018 at 2:21 PM  Between 7am to 6pm - Pager - [phone]  After 6pm go to [email or web address] - password [email or web address] [place] [facility]  Office  [phone]  CC: Primary care physician; Patient, No Pcp Per   Note: This dictation was prepared with Dragon dictation along with smaller phrase technology. Any transcriptional errors that result from this process are unintentional.

## 2018-11-13 NOTE — Progress Notes (Signed)
PT Cancellation Note  Patient Details Name: Olivia Rodriguez MRN: [id number] DOB: 1951/01/07   Cancelled Treatment:    Reason Eval/Treat Not Completed: Other (comment)(Pt reports she is unable to participate at this time as she needs to be cleaned up after an incontinence episode. Educated pt on affects of bed rest and futility of self-imposed bed rest in the setting of fatigue.  )  9:32 AM, 11/13/18 Etta Grandchild, PT, DPT Physical Therapist - [facility]  [phone] ([place])   Buccola,Allan C 11/13/2018, 9:32 AM

## 2018-11-13 NOTE — Progress Notes (Signed)
Pt is being discharged to [facility].  Called report to [place], RN.  AVS given and explained to pt. Pt verbalized understanding. Rx and 2nd copy of AVS placed in discharge packet for facility. Awaiting EMS.

## 2018-12-08 ENCOUNTER — Encounter (INDEPENDENT_AMBULATORY_CARE_PROVIDER_SITE_OTHER): Payer: Medicare Other

## 2018-12-08 ENCOUNTER — Ambulatory Visit (INDEPENDENT_AMBULATORY_CARE_PROVIDER_SITE_OTHER): Payer: Medicare Other | Admitting: Vascular Surgery

## 2018-12-21 ENCOUNTER — Ambulatory Visit: Payer: Medicare Other | Admitting: Podiatry

## 2019-01-21 ENCOUNTER — Ambulatory Visit: Payer: Medicare Other | Admitting: Podiatry

## 2019-02-04 ENCOUNTER — Ambulatory Visit: Payer: Medicare Other | Admitting: Podiatry

## 2019-04-08 ENCOUNTER — Inpatient Hospital Stay: Admission: RE | Admit: 2019-04-08 | Payer: Medicare Other | Source: Ambulatory Visit

## 2019-04-20 ENCOUNTER — Other Ambulatory Visit: Payer: Medicare Other

## 2019-05-10 NOTE — H&P (Signed)
Pt here to follow up for PMB that she was seen for in Feb 2020. Pt continues to have bleeding and at times clot passage .  + thickened endometrial stripe on u/s Feb 2020.   she underwent a Hip replacement earlier this yr .   Past Medical History:  has a past medical history of C. difficile colitis, Hypertension, and PMB (postmenopausal bleeding).  Past Surgical History:  has a past surgical history that includes Tonsillectomy; Cesarean section; and left hip surgery (09/2018). Family History: family history includes Heart disease in her father and mother; High blood pressure (Hypertension) in her father and mother; Myocardial Infarction (Heart attack) in her father and mother. Social History:  reports that she has never smoked. She has never used smokeless tobacco. She reports current alcohol use. She reports that she does not use drugs. OB/GYN History:          OB History    Gravida  1   Para  1   Term      Preterm      AB      Living  1     SAB      TAB      Ectopic      Molar      Multiple      Live Births  1          Allergies: is allergic to penicillin and penicillins. Medications:  Current Outpatient Medications:  .  hydroCHLOROthiazide (HYDRODIURIL) 25 MG tablet, Take 25 mg by mouth once daily, Disp: , Rfl:  .  mirabegron (MYRBETRIQ) 25 mg ER Tablet, Take 1 tablet (25 mg total) by mouth once daily, Disp: 30 tablet, Rfl: 11 .  vancomycin (VANCOCIN) 125 MG capsule, Take 125 mg by mouth 4 (four) times daily, Disp: , Rfl:  .  acetaminophen (TYLENOL) 325 MG tablet, Take by mouth Take 1-2 tablets (325-650 mg total) by mouth every 6 (six) hours as needed for mild pain (pain score 1-3 or temp > 100.5)., Disp: , Rfl:  .  docusate (COLACE) 100 MG capsule, Take by mouth Take 1 capsule (100 mg total) by mouth 2 (two) times daily., Disp: , Rfl:  .  oxyCODONE (DAZIDOX) 10 mg immediate release tablet, Take by mouth Take 1 tablet (10 mg total) by mouth every 3 (three)  hours as needed for moderate pain., Disp: , Rfl:   Review of Systems: General:                      No fatigue or weight loss Eyes:                           No vision changes Ears:                            No hearing difficulty Respiratory:                No cough or shortness of breath Pulmonary:                  No asthma or shortness of breath Cardiovascular:           No chest pain, palpitations, dyspnea on exertion Gastrointestinal:          No abdominal bloating, chronic diarrhea, constipations, masses, pain or hematochezia Genitourinary:             No  hematuria, dysuria, abnormal vaginal discharge, pelvic pain, Menometrorrhagia Lymphatic:                   No swollen lymph nodes Musculoskeletal:         No muscle weakness Neurologic:                  No extremity weakness, syncope, seizure disorder Psychiatric:                  No history of depression, delusions or suicidal/homicidal ideation    Exam:      Vitals:  05/11/2019   BP: 138/82    Body mass index is 47.92 kg/m.  WDWN white/ female in NAD   Lungs: CTA  CV : RRR without murmur   Abdomen: soft , no mass, normal active bowel sounds,  non-tender, no rebound tenderness Pelvic: tanner stage 5 ,  External genitalia: vulva /labia no lesions Urethra: no prolapse Vagina: normal physiologic d/c Cervix: no lesions, no cervical motion tenderness,inability to fully visualize Uterus: normal size shape and contour, non-tender Adnexa:no mass, non-tender   Impression:   The encounter diagnosis was PMB (postmenopausal bleeding).  R/o hyperplasia , cancer   Plan:  Fractional D+C and h/s  In [facility]  Risks of the procedure have been discussed with the pt .       Return if symptoms worsen or fail to improve, for preop.  Caroline Sauger, MD

## 2019-05-14 ENCOUNTER — Inpatient Hospital Stay: Admission: RE | Admit: 2019-05-14 | Payer: Medicare Other | Source: Ambulatory Visit

## 2019-05-18 ENCOUNTER — Other Ambulatory Visit: Admission: RE | Admit: 2019-05-18 | Payer: Medicare Other | Source: Ambulatory Visit

## 2019-05-18 ENCOUNTER — Encounter
Admission: RE | Admit: 2019-05-18 | Discharge: 2019-05-18 | Disposition: A | Discharge status: Home / Self Care | Payer: Medicare Other | Source: Ambulatory Visit | Attending: Obstetrics and Gynecology | Admitting: Obstetrics and Gynecology

## 2019-05-18 ENCOUNTER — Other Ambulatory Visit: Payer: Self-pay

## 2019-05-18 DIAGNOSIS — R9431 Abnormal electrocardiogram [ECG] [EKG]: Secondary | ICD-10-CM | POA: Insufficient documentation

## 2019-05-18 DIAGNOSIS — Z20828 Contact with and (suspected) exposure to other viral communicable diseases: Secondary | ICD-10-CM | POA: Insufficient documentation

## 2019-05-18 DIAGNOSIS — Z01818 Encounter for other preprocedural examination: Secondary | ICD-10-CM | POA: Insufficient documentation

## 2019-05-18 DIAGNOSIS — I10 Essential (primary) hypertension: Secondary | ICD-10-CM | POA: Insufficient documentation

## 2019-05-18 HISTORY — DX: Essential (primary) hypertension: I10

## 2019-05-18 LAB — BASIC METABOLIC PANEL
Anion gap: 8 (ref 5–15)
BUN: 27 mg/dL — ABNORMAL HIGH (ref 8–23)
CO2: 26 mmol/L (ref 22–32)
Calcium: 9.3 mg/dL (ref 8.9–10.3)
Chloride: 106 mmol/L (ref 98–111)
Creatinine, Ser: 0.84 mg/dL (ref 0.44–1.00)
GFR calc Af Amer: >60 mL/min (ref >60)
GFR calc non Af Amer: >60 mL/min (ref >60)
Glucose, Bld: 95 mg/dL (ref 70–99)
Potassium: 3.3 mmol/L — ABNORMAL LOW (ref 3.5–5.1)
Sodium: 140 mmol/L (ref 135–145)

## 2019-05-18 LAB — CBC
HCT: 26.9 % — ABNORMAL LOW (ref 36.0–46.0)
Hemoglobin: 8.1 g/dL — ABNORMAL LOW (ref 12.0–15.0)
MCH: 25.6 pg — ABNORMAL LOW (ref 26.0–34.0)
MCHC: 30.1 g/dL (ref 30.0–36.0)
MCV: 85.1 fL (ref 80.0–100.0)
Platelets: 458 10*3/uL — ABNORMAL HIGH (ref 150–400)
RBC: 3.16 MIL/uL — ABNORMAL LOW (ref 3.87–5.11)
RDW: 14.9 % (ref 11.5–15.5)
WBC: 11.1 10*3/uL — ABNORMAL HIGH (ref 4.0–10.5)
nRBC: 0 % (ref 0.0–0.2)

## 2019-05-18 NOTE — Patient Instructions (Signed)
Your procedure is scheduled on: Friday 05/21/19 Report to [place]. To find out your arrival time please call [phone] between 1PM - 3PM on Thursday 05/20/19.  Remember: Instructions that are not followed completely may result in serious medical risk, up to and including death, or upon the discretion of your surgeon and anesthesiologist your surgery may need to be rescheduled.     _X__ 1. Do not eat food after midnight the night before your procedure.                 No gum chewing or hard candies. You may drink clear liquids up to 2 hours                 before you are scheduled to arrive for your surgery- DO not drink clear                 liquids within 2 hours of the start of your surgery.                 Clear Liquids include:  water, apple juice without pulp, clear carbohydrate                 drink such as Clearfast or Gatorade, Black Coffee or Tea (Do not add                 anything to coffee or tea). Diabetics water only  ENSURE PRE-SURGERY DRINK 2 HOURS PRIOR TO ARRIVAL __X__2.  On the morning of surgery brush your teeth with toothpaste and water, you                 may rinse your mouth with mouthwash if you wish.  Do not swallow any              toothpaste of mouthwash.     _X__ 3.  No Alcohol for 24 hours before or after surgery.   _X__ 4.  Do Not Smoke or use e-cigarettes For 24 Hours Prior to Your Surgery.                 Do not use any chewable tobacco products for at least 6 hours prior to                 surgery.  ____  5.  Bring all medications with you on the day of surgery if instructed.   __X__  6.  Notify your doctor if there is any change in your medical condition      (cold, fever, infections).     Do not wear jewelry, make-up, hairpins, clips or nail polish. Do not wear lotions, powders, or perfumes.  Do not shave 48 hours prior to surgery. Men may shave face and neck. Do not bring valuables to the  hospital.    [facility] is not responsible for any belongings or valuables.  Contacts, dentures/partials or body piercings may not be worn into surgery. Bring a case for your contacts, glasses or hearing aids, a denture cup will be supplied. Leave your suitcase in the car. After surgery it may be brought to your room. For patients admitted to the hospital, discharge time is determined by your treatment team.   Patients discharged the day of surgery will not be allowed to drive home.   Please read over the following fact sheets that you were given:   MRSA Information  __X__ Take these medicines the  morning of surgery with A SIP OF WATER:    1. NONE  2.   3.   4.  5.  6.  ____ Fleet Enema (as directed)   ____ Use CHG Soap/SAGE wipes as directed  ____ Use inhalers on the day of surgery  ____ Stop metformin/Janumet/Farxiga 2 days prior to surgery    ____ Take 1/2 of usual insulin dose the night before surgery. No insulin the morning          of surgery.   ____ Stop Blood Thinners Coumadin/Plavix/Xarelto/Pleta/Pradaxa/Eliquis/Effient/Aspirin  on   Or contact your Surgeon, Cardiologist or Medical Doctor regarding  ability to stop your blood thinners  __X__ Stop Anti-inflammatories 7 days before surgery such as Advil, Ibuprofen, Motrin,  BC or Goodies Powder, Naprosyn, Naproxen, Aleve, Aspirin    __X__ Stop all herbal supplements, fish oil or vitamin E until after surgery. TODAY    ____ Bring C-Pap to the hospital.    Practice incentive spirometer.

## 2019-05-19 LAB — SARS CORONAVIRUS 2 (TAT 6-24 HRS): SARS Coronavirus 2: NEGATIVE

## 2019-05-21 ENCOUNTER — Inpatient Hospital Stay
Admission: RE | Admit: 2019-05-21 | Discharge: 2019-05-21 | DRG: 744 | Disposition: A | Discharge status: Other Acute Inpatient Hospital | Payer: Medicare Other | Attending: Obstetrics and Gynecology | Admitting: Obstetrics and Gynecology

## 2019-05-21 ENCOUNTER — Encounter
Admission: RE | Disposition: A | Discharge status: Other Acute Inpatient Hospital | Payer: Self-pay | Source: Home / Self Care | Attending: Obstetrics and Gynecology

## 2019-05-21 ENCOUNTER — Ambulatory Visit: Payer: Medicare Other | Admitting: Anesthesiology

## 2019-05-21 ENCOUNTER — Encounter: Payer: Self-pay | Admitting: Anesthesiology

## 2019-05-21 ENCOUNTER — Other Ambulatory Visit: Payer: Self-pay

## 2019-05-21 ENCOUNTER — Telehealth: Payer: Self-pay | Admitting: Internal Medicine

## 2019-05-21 DIAGNOSIS — Z8619 Personal history of other infectious and parasitic diseases: Secondary | ICD-10-CM | POA: Diagnosis not present

## 2019-05-21 DIAGNOSIS — Z6841 Body Mass Index (BMI) 40.0 and over, adult: Secondary | ICD-10-CM | POA: Diagnosis not present

## 2019-05-21 DIAGNOSIS — I10 Essential (primary) hypertension: Secondary | ICD-10-CM | POA: Diagnosis present

## 2019-05-21 DIAGNOSIS — Z79899 Other long term (current) drug therapy: Secondary | ICD-10-CM | POA: Diagnosis not present

## 2019-05-21 DIAGNOSIS — Z8249 Family history of ischemic heart disease and other diseases of the circulatory system: Secondary | ICD-10-CM

## 2019-05-21 DIAGNOSIS — C541 Malignant neoplasm of endometrium: Secondary | ICD-10-CM | POA: Diagnosis present

## 2019-05-21 DIAGNOSIS — D649 Anemia, unspecified: Secondary | ICD-10-CM | POA: Insufficient documentation

## 2019-05-21 DIAGNOSIS — Z20828 Contact with and (suspected) exposure to other viral communicable diseases: Secondary | ICD-10-CM | POA: Diagnosis present

## 2019-05-21 DIAGNOSIS — N950 Postmenopausal bleeding: Secondary | ICD-10-CM | POA: Diagnosis present

## 2019-05-21 DIAGNOSIS — D630 Anemia in neoplastic disease: Secondary | ICD-10-CM | POA: Diagnosis present

## 2019-05-21 DIAGNOSIS — Z79891 Long term (current) use of opiate analgesic: Secondary | ICD-10-CM | POA: Diagnosis not present

## 2019-05-21 DIAGNOSIS — Z880 Allergy status to penicillin: Secondary | ICD-10-CM

## 2019-05-21 HISTORY — PX: DILATION AND CURETTAGE OF UTERUS: SHX78

## 2019-05-21 LAB — CBC
HCT: 25.6 % — ABNORMAL LOW (ref 36.0–46.0)
Hemoglobin: 7.9 g/dL — ABNORMAL LOW (ref 12.0–15.0)
MCH: 26.2 pg (ref 26.0–34.0)
MCHC: 30.9 g/dL (ref 30.0–36.0)
MCV: 85 fL (ref 80.0–100.0)
Platelets: 448 10*3/uL — ABNORMAL HIGH (ref 150–400)
RBC: 3.01 MIL/uL — ABNORMAL LOW (ref 3.87–5.11)
RDW: 14.8 % (ref 11.5–15.5)
WBC: 9.4 10*3/uL (ref 4.0–10.5)
nRBC: 0 % (ref 0.0–0.2)

## 2019-05-21 LAB — POCT I-STAT, CHEM 8
BUN: 20 mg/dL (ref 8–23)
Calcium, Ion: 1.25 mmol/L (ref 1.15–1.40)
Chloride: 102 mmol/L (ref 98–111)
Creatinine, Ser: 0.8 mg/dL (ref 0.44–1.00)
Glucose, Bld: 84 mg/dL (ref 70–99)
HCT: 24 % — ABNORMAL LOW (ref 36.0–46.0)
Hemoglobin: 8.2 g/dL — ABNORMAL LOW (ref 12.0–15.0)
Potassium: 3.4 mmol/L — ABNORMAL LOW (ref 3.5–5.1)
Sodium: 139 mmol/L (ref 135–145)
TCO2: 26 mmol/L (ref 22–32)

## 2019-05-21 LAB — PREPARE RBC (CROSSMATCH)

## 2019-05-21 SURGERY — DILATION AND CURETTAGE
Anesthesia: General

## 2019-05-21 MED ORDER — TRANEXAMIC ACID 1000 MG/10ML IV SOLN
INTRAVENOUS | Status: DC | PRN
  Administered 2019-05-21: 14:00:00 1000 mg via INTRAVENOUS

## 2019-05-21 MED ORDER — LACTATED RINGERS IV SOLN
INTRAVENOUS | Status: DC | PRN
  Administered 2019-05-21: 13:00:00 via INTRAVENOUS

## 2019-05-21 MED ORDER — SILVER NITRATE-POT NITRATE 75-25 % EX MISC
CUTANEOUS | Status: AC
  Filled 2019-05-21: qty 1

## 2019-05-21 MED ORDER — METRONIDAZOLE IN NACL 5-0.79 MG/ML-% IV SOLN
500.0000 mg | Freq: Once | INTRAVENOUS | Status: AC
  Administered 2019-05-21: 13:00:00 500 mg via INTRAVENOUS
  Filled 2019-05-21: qty 100

## 2019-05-21 MED ORDER — GENTAMICIN SULFATE 40 MG/ML IJ SOLN: INTRAVENOUS | Status: DC | PRN

## 2019-05-21 MED ORDER — FENTANYL CITRATE (PF) 100 MCG/2ML IJ SOLN
INTRAMUSCULAR | Status: DC | PRN
  Administered 2019-05-21: 50 ug via INTRAVENOUS
  Administered 2019-05-21 (×3): 25 ug via INTRAVENOUS

## 2019-05-21 MED ORDER — TRANEXAMIC ACID 1000 MG/10ML IV SOLN
INTRAVENOUS | Status: AC
  Filled 2019-05-21: qty 10

## 2019-05-21 MED ORDER — GLYCOPYRROLATE 0.2 MG/ML IJ SOLN
INTRAMUSCULAR | Status: DC | PRN
  Administered 2019-05-21: 0.2 mg via INTRAVENOUS

## 2019-05-21 MED ORDER — ACETAMINOPHEN 500 MG PO TABS
ORAL_TABLET | ORAL | Status: AC
  Filled 2019-05-21: qty 2

## 2019-05-21 MED ORDER — DEXAMETHASONE SODIUM PHOSPHATE 10 MG/ML IJ SOLN
INTRAMUSCULAR | Status: DC | PRN
  Administered 2019-05-21: 10 mg via INTRAVENOUS

## 2019-05-21 MED ORDER — FAMOTIDINE 20 MG PO TABS
20.0000 mg | ORAL_TABLET | Freq: Once | ORAL | Status: AC
  Administered 2019-05-21: 11:00:00 20 mg via ORAL

## 2019-05-21 MED ORDER — MIDAZOLAM HCL 2 MG/2ML IJ SOLN
INTRAMUSCULAR | Status: AC
  Filled 2019-05-21: qty 2

## 2019-05-21 MED ORDER — PROPOFOL 10 MG/ML IV BOLUS
INTRAVENOUS | Status: AC
  Filled 2019-05-21: qty 20

## 2019-05-21 MED ORDER — LIDOCAINE HCL (CARDIAC) PF 100 MG/5ML IV SOSY
PREFILLED_SYRINGE | INTRAVENOUS | Status: DC | PRN
  Administered 2019-05-21: 100 mg via INTRAVENOUS

## 2019-05-21 MED ORDER — FENTANYL CITRATE (PF) 100 MCG/2ML IJ SOLN
INTRAMUSCULAR | Status: AC
  Filled 2019-05-21: qty 2

## 2019-05-21 MED ORDER — FENTANYL CITRATE (PF) 100 MCG/2ML IJ SOLN
25.0000 ug | INTRAMUSCULAR | Status: DC | PRN
  Administered 2019-05-21: 15:00:00 25 ug via INTRAVENOUS

## 2019-05-21 MED ORDER — ONDANSETRON HCL 4 MG/2ML IJ SOLN
INTRAMUSCULAR | Status: DC | PRN
  Administered 2019-05-21: 4 mg via INTRAVENOUS

## 2019-05-21 MED ORDER — FENTANYL CITRATE (PF) 100 MCG/2ML IJ SOLN
INTRAMUSCULAR | Status: AC
  Administered 2019-05-21: 25 ug via INTRAVENOUS
  Filled 2019-05-21: qty 2

## 2019-05-21 MED ORDER — GABAPENTIN 300 MG PO CAPS
ORAL_CAPSULE | ORAL | Status: AC
  Filled 2019-05-21: qty 1

## 2019-05-21 MED ORDER — GENTAMICIN SULFATE 40 MG/ML IJ SOLN
1.5000 mg/kg | Freq: Once | INTRAVENOUS | Status: AC
  Administered 2019-05-21: 13:00:00 170 mg via INTRAVENOUS
  Filled 2019-05-21: qty 4.25

## 2019-05-21 MED ORDER — SODIUM CHLORIDE 0.9 % IV SOLN: 10.0000 mL/h | Freq: Once | INTRAVENOUS | Status: DC

## 2019-05-21 MED ORDER — ONDANSETRON HCL 4 MG/2ML IJ SOLN: 4.0000 mg | Freq: Four times a day (QID) | INTRAMUSCULAR | Status: DC | PRN

## 2019-05-21 MED ORDER — HYDRALAZINE HCL 20 MG/ML IJ SOLN
INTRAMUSCULAR | Status: AC
  Administered 2019-05-21: 16:00:00 10 mg via INTRAVENOUS
  Filled 2019-05-21: qty 1

## 2019-05-21 MED ORDER — GABAPENTIN 300 MG PO CAPS
300.0000 mg | ORAL_CAPSULE | ORAL | Status: AC
  Administered 2019-05-21: 300 mg via ORAL

## 2019-05-21 MED ORDER — ACETAMINOPHEN 500 MG PO TABS
1000.0000 mg | ORAL_TABLET | ORAL | Status: AC
  Administered 2019-05-21: 11:00:00 1000 mg via ORAL

## 2019-05-21 MED ORDER — ONDANSETRON HCL 4 MG/2ML IJ SOLN: 4.0000 mg | Freq: Once | INTRAMUSCULAR | Status: DC | PRN

## 2019-05-21 MED ORDER — FAMOTIDINE 20 MG PO TABS
ORAL_TABLET | ORAL | Status: AC
  Filled 2019-05-21: qty 1

## 2019-05-21 MED ORDER — HYDRALAZINE HCL 20 MG/ML IJ SOLN
10.0000 mg | Freq: Once | INTRAMUSCULAR | Status: AC
  Administered 2019-05-21: 16:00:00 10 mg via INTRAVENOUS

## 2019-05-21 MED ORDER — PROPOFOL 10 MG/ML IV BOLUS
INTRAVENOUS | Status: DC | PRN
  Administered 2019-05-21: 50 mg via INTRAVENOUS
  Administered 2019-05-21: 150 mg via INTRAVENOUS

## 2019-05-21 MED ORDER — PHENYLEPHRINE HCL (PRESSORS) 10 MG/ML IV SOLN
INTRAVENOUS | Status: DC | PRN
  Administered 2019-05-21: 100 ug via INTRAVENOUS

## 2019-05-21 MED ORDER — LIDOCAINE HCL (PF) 2 % IJ SOLN
INTRAMUSCULAR | Status: AC
  Filled 2019-05-21: qty 10

## 2019-05-21 MED ORDER — LACTATED RINGERS IV SOLN
INTRAVENOUS | Status: DC
  Administered 2019-05-21: 11:00:00 via INTRAVENOUS

## 2019-05-21 MED ORDER — MIDAZOLAM HCL 2 MG/2ML IJ SOLN
INTRAMUSCULAR | Status: DC | PRN
  Administered 2019-05-21: 2 mg via INTRAVENOUS

## 2019-05-21 SURGICAL SUPPLY — 26 items
BASIN GRAD PLASTIC 32OZ STRL (MISCELLANEOUS) ×2 IMPLANT
CANISTER SUCT 3000ML PPV (MISCELLANEOUS) ×3 IMPLANT
CATH ROBINSON RED A/P 16FR (CATHETERS) ×2 IMPLANT
CNTNR SPEC 2.5X3XGRAD LEK (MISCELLANEOUS) ×1
CONT SPEC 4OZ STER OR WHT (MISCELLANEOUS) ×2
CONTAINER SPEC 2.5X3XGRAD LEK (MISCELLANEOUS) IMPLANT
COVER WAND RF STERILE (DRAPES) IMPLANT
DEVICE MYOSURE LITE (MISCELLANEOUS) IMPLANT
DEVICE MYOSURE REACH (MISCELLANEOUS) IMPLANT
GAUZE 4X4 16PLY RFD (DISPOSABLE) ×6 IMPLANT
GLOVE BIO SURGEON STRL SZ8 (GLOVE) ×3 IMPLANT
GOWN STRL REUS W/ TWL LRG LVL3 (GOWN DISPOSABLE) ×1 IMPLANT
GOWN STRL REUS W/ TWL XL LVL3 (GOWN DISPOSABLE) ×1 IMPLANT
GOWN STRL REUS W/TWL LRG LVL3 (GOWN DISPOSABLE) ×6
GOWN STRL REUS W/TWL XL LVL3 (GOWN DISPOSABLE) ×2
KIT PROCEDURE FLUENT (KITS) ×3 IMPLANT
KIT TURNOVER CYSTO (KITS) ×3 IMPLANT
PACK DNC HYST (MISCELLANEOUS) ×2 IMPLANT
PAD OB MATERNITY 4.3X12.25 (PERSONAL CARE ITEMS) ×3 IMPLANT
PAD PREP 24X41 OB/GYN DISP (PERSONAL CARE ITEMS) ×3 IMPLANT
SEAL ROD LENS SCOPE MYOSURE (ABLATOR) ×3 IMPLANT
SOL .9 NS 3000ML IRR  AL (IV SOLUTION) ×2
SOL .9 NS 3000ML IRR UROMATIC (IV SOLUTION) ×1 IMPLANT
TOWEL OR 17X26 4PK STRL BLUE (TOWEL DISPOSABLE) ×3 IMPLANT
TUBING CONNECTING 10 (TUBING) IMPLANT
TUBING CONNECTING 10' (TUBING)

## 2019-05-21 NOTE — Progress Notes (Signed)
Ready for fractional D+C and H/s  Ready for surgery  Labs reviewed

## 2019-05-21 NOTE — Progress Notes (Signed)
D/C pt to [facility] for transport to [facility].  VSS, NAD.  Pt denies pain.  Safety maintained.

## 2019-05-21 NOTE — Brief Op Note (Signed)
05/21/2019  2:17 PM  PATIENT:  Kandis Ban  68 y.o. female  PRE-OPERATIVE DIAGNOSIS:  postmenopausal bleeding  POST-OPERATIVE DIAGNOSIS:  Endometrial cancer  anemia PROCEDURE:  Procedure(s): DILATATION AND CURETTAGE /HYSTEROSCOPY, FRACTIONAL (N/A)  SURGEON:  Surgeon(s) and Role:    * Radiah Lubinski, Gwen Her, MD - Primary  PHYSICIAN ASSISTANT:   ASSISTANTS: none   ANESTHESIA:   general  EBL:  300 mL   BLOOD ADMINISTERED:1 unit  in PACU CC PRBC  DRAINS: Urinary Catheter (Foley)   LOCAL MEDICATIONS USED:  NONE  SPECIMEN:  Source of Specimen:  large amount of endometrial tissue c/w endometrial cancer   DISPOSITION OF SPECIMEN:  PATHOLOGY, frozen section as well  COUNTS:  YES  TOURNIQUET:  * No tourniquets in log *  DICTATION: .Other Dictation: Dictation Number verbal  PLAN OF CARE: Admit to inpatient   PATIENT DISPOSITION:  PACU - hemodynamically stable.   Delay start of Pharmacological VTE agent (>24hrs) due to surgical blood loss or risk of bleeding: not applicable

## 2019-05-21 NOTE — Anesthesia Preprocedure Evaluation (Addendum)
Anesthesia Evaluation  Patient identified by MRN, date of birth, ID band Patient awake    Reviewed: Allergy & Precautions, NPO status , Patient's Chart, lab work & pertinent test results, reviewed documented beta blocker date and time   Airway Mallampati: III  TM Distance: >3 FB     Dental  (+) Chipped, Poor Dentition, Missing   Pulmonary           Cardiovascular hypertension, Pt. on medications      Neuro/Psych    GI/Hepatic   Endo/Other  Morbid obesity  Renal/GU      Musculoskeletal  (+) Arthritis ,   Abdominal   Peds  Hematology   Anesthesia Other Findings   Reproductive/Obstetrics                            Anesthesia Physical Anesthesia Plan  ASA: III  Anesthesia Plan: General   Post-op Pain Management:    Induction: Intravenous  PONV Risk Score and Plan:   Airway Management Planned: Oral ETT and LMA  Additional Equipment:   Intra-op Plan:   Post-operative Plan:   Informed Consent: I have reviewed the patients History and Physical, chart, labs and discussed the procedure including the risks, benefits and alternatives for the proposed anesthesia with the patient or authorized representative who has indicated his/her understanding and acceptance.       Plan Discussed with: CRNA  Anesthesia Plan Comments:         Anesthesia Quick Evaluation

## 2019-05-21 NOTE — Op Note (Signed)
NAMENEHEMIE, Olivia Rodriguez MEDICAL RECORD [id number] ACCOUNT [id number] DATE OF BIRTH:Oct 16, 1950 FACILITY: [facility] LOCATION: [facility] PHYSICIAN:THOMAS Josefine Class, MD  OPERATIVE REPORT  DATE OF PROCEDURE:  05/21/2019  PREOPERATIVE DIAGNOSES: 1.  Postmenopausal bleeding. 2.  Anemia.  POSTOPERATIVE DIAGNOSES: 1.  Endometrial carcinoma. 2.  Anemia.  PROCEDURE: 1.  Dilation and curettage. 2.  Blood transfusion.  ANESTHESIA:  General endotracheal anesthesia.  SURGEON:  Laverta Baltimore, MD  INDICATIONS:  A 68 year old female with a known history of postmenopausal bleeding and thickened endometrial stripe.  DESCRIPTION OF PROCEDURE:  After adequate general endotracheal anesthesia, the patient was placed in dorsal supine position, legs in the candy cane stirrups.  The patient's abdomen, perineum, and vagina were prepped and draped in normal sterile fashion.   Of note, there was tissue that was removed from the vagina during the prep that was consistent with fatty tissue consistent with endometrial cancer.  Timeout was performed.  The patient did receive gentamicin and Flagyl for prophylaxis.  A weighted  speculum was placed in the posterior vaginal vault, and a copious amount of fatty tissue was removed from the vault.  The anterior cervix was grasped with a single-tooth tenaculum, and curettage was performed with again a copious amount of chunks of  tissue.  She had 300 mL of bleeding during the procedure.  PACKING:  Vaginal packing was performed to slow the ooze.  Frozen section of the tissue was sent to pathology that confirmed carcinoma.  INTRAOPERATIVE FLUIDS:  1000 mL.  ESTIMATED BLOOD LOSS:  300 mL.  URINE OUTPUT:  100 mL.  DISPOSITION:  The patient was taken to recovery room in good condition.  LN/NUANCE  D:05/21/2019 T:05/21/2019 JOB:[id number]

## 2019-05-21 NOTE — Progress Notes (Signed)
Dr. Ouida Sills at bedside to speak with patient and daughter. Per MD, pt NPO except for a few ice chips. Pt verbalized understanding. Small amount of ice chips given to patient.

## 2019-05-21 NOTE — Progress Notes (Signed)
Pt in PACU awaiting transfer to [facility]. BP elevated; 216/85, HR 50s. Anesthesiologist Piscitello notified and 10mg  IV hydralazine once ordered. Given at 1533. BP improved to 157/73. Dr. Amie Critchley came to bedside to evaluate. BP continued to trend down. BP 125/57, HR 54, 100% on 2L [place]. Pt alert and oriented. Blood transfusing. No signs of bleeding. Urine output 134ml. Notified Dr. Amie Critchley who verbalized understanding. No orders at this time. Notified Dr. Ouida Sills of the above findings. Dr. Ouida Sills verbalized understanding and nurse instructed to continue to assess. Nothing ordered at this time.  Will continue to assess.

## 2019-05-21 NOTE — Anesthesia Post-op Follow-up Note (Signed)
Anesthesia QCDR form completed.        

## 2019-05-21 NOTE — Transfer of Care (Signed)
Immediate Anesthesia Transfer of Care Note  Patient: Olivia Rodriguez  Procedure(s) Performed: DILATATION AND CURETTAGE /HYSTEROSCOPY, FRACTIONAL (N/A )  Patient Location: PACU  Anesthesia Type:General  Level of Consciousness: sedated  Airway & Oxygen Therapy: Patient Spontanous Breathing and Patient connected to nasal cannula oxygen  Post-op Assessment: Report given to RN and Post -op Vital signs reviewed and stable  Post vital signs: Reviewed and stable  Last Vitals:  Vitals Value Taken Time  BP 195/86 05/21/19 1437  Temp 36.3 C 05/21/19 1422  Pulse 75 05/21/19 1439  Resp 17 05/21/19 1439  SpO2 100 % 05/21/19 1439  Vitals shown include unvalidated device data.  Last Pain:  Vitals:   05/21/19 1027  TempSrc: Oral  PainSc: 0-No pain         Complications: No apparent anesthesia complications

## 2019-05-21 NOTE — Discharge Summary (Signed)
GYN discharge summary: pt underwent a D+C today  PMB  Date of admission 05/21/2019 Date of discharge :05/21/2019   Admission diagnosis PMB  Discharge diagnosis endometrial cancer, anemia Procedure:D+C  Vaginal packing and blood transfusion 1 unit   Hospital course under the above procedure . Copious amount of endometrial tissue c/w endometrial cancer . Active oozing at the end of case . VAginal packing .  Results for orders placed or performed during the hospital encounter of 05/21/19 (from the past 24 hour(s))  I-STAT, chem 8     Status: Abnormal   Collection Time: 05/21/19 10:57 AM  Result Value Ref Range   Sodium 139 135 - 145 mmol/L   Potassium 3.4 (L) 3.5 - 5.1 mmol/L   Chloride 102 98 - 111 mmol/L   BUN 20 8 - 23 mg/dL   Creatinine, Ser 0.80 0.44 - 1.00 mg/dL   Glucose, Bld 84 70 - 99 mg/dL   Calcium, Ion 1.25 1.15 - 1.40 mmol/L   TCO2 26 22 - 32 mmol/L   Hemoglobin 8.2 (L) 12.0 - 15.0 g/dL   HCT 24.0 (L) 36.0 - 46.0 %  CBC     Status: Abnormal   Collection Time: 05/21/19  1:21 PM  Result Value Ref Range   WBC 9.4 4.0 - 10.5 K/uL   RBC 3.01 (L) 3.87 - 5.11 MIL/uL   Hemoglobin 7.9 (L) 12.0 - 15.0 g/dL   HCT 25.6 (L) 36.0 - 46.0 %   MCV 85.0 80.0 - 100.0 fL   MCH 26.2 26.0 - 34.0 pg   MCHC 30.9 30.0 - 36.0 g/dL   RDW 14.8 11.5 - 15.5 %   Platelets 448 (H) 150 - 400 K/uL   nRBC 0.0 0.0 - 0.2 %  Prepare RBC (crossmatch)     Status: None   Collection Time: 05/21/19  2:00 PM  Result Value Ref Range   Order Confirmation      ORDER PROCESSED BY BLOOD BANK Performed at [facility], [address], [place], [place] [address]   Prepare RBC     Status: None   Collection Time: 05/21/19  2:06 PM  Result Value Ref Range   Order Confirmation      DUPLICATE REQUEST Performed at [facility], [address] [place], [place] [address]     Studies: none   Labs as above  Disposition :spoke with GYN ONc at [facility] . Given the possibility of continued blood  loss and the need for definitive surgery I felt pt would be best served with transfer . Dr Shon Baton attending [place] accepting . Possible IR intervention tonight . The patient and her daughter are aware and understand the indication for transfer .   Follow up in 4 weeks with Dr Ouida Sills

## 2019-05-21 NOTE — Anesthesia Procedure Notes (Signed)
Procedure Name: LMA Insertion Date/Time: 05/21/2019 1:08 PM Performed by: Justus Memory, CRNA Pre-anesthesia Checklist: Patient identified, Patient being monitored, Timeout performed, Emergency Drugs available and Suction available Patient Re-evaluated:Patient Re-evaluated prior to induction Oxygen Delivery Method: Circle system utilized Preoxygenation: Pre-oxygenation with 100% oxygen Induction Type: IV induction Ventilation: Mask ventilation without difficulty LMA: LMA inserted LMA Size: 3.0 Tube type: Oral Number of attempts: 1 Placement Confirmation: positive ETCO2 and breath sounds checked- equal and bilateral Tube secured with: Tape Dental Injury: Teeth and Oropharynx as per pre-operative assessment

## 2019-05-21 NOTE — Telephone Encounter (Signed)
Consulted by gyn re: anemia/bleed post Bx/uterine. Evaluated the pt in PACU.; pt getting PRBC transfusion.   Spoke to Dr. Ouida Sills; patient being transferred to [facility].  Agree given the complicated case.  Formal consultation not performed.

## 2019-05-22 LAB — TYPE AND SCREEN
ABO/RH(D): O POS
Antibody Screen: NEGATIVE
Unit division: 0
Unit division: 0

## 2019-05-22 LAB — BPAM RBC
Blood Product Expiration Date: 202010052359
Blood Product Expiration Date: 202010242359
ISSUE DATE / TIME: 202009251408
ISSUE DATE / TIME: 202009251408
Unit Type and Rh: 5100
Unit Type and Rh: 5100

## 2019-05-22 LAB — PREPARE RBC (CROSSMATCH)

## 2019-05-23 DIAGNOSIS — E876 Hypokalemia: Secondary | ICD-10-CM | POA: Insufficient documentation

## 2019-05-23 DIAGNOSIS — R339 Retention of urine, unspecified: Secondary | ICD-10-CM | POA: Insufficient documentation

## 2019-05-24 ENCOUNTER — Encounter: Payer: Self-pay | Admitting: Obstetrics and Gynecology

## 2019-05-24 NOTE — Anesthesia Postprocedure Evaluation (Signed)
Anesthesia Post Note  Patient: Olivia Rodriguez  Procedure(s) Performed: DILATATION AND CURETTAGE, FRACTIONAL (N/A )  Patient location during evaluation: PACU Anesthesia Type: General Level of consciousness: awake and alert Pain management: pain level controlled Vital Signs Assessment: post-procedure vital signs reviewed and stable Respiratory status: spontaneous breathing, nonlabored ventilation and respiratory function stable Cardiovascular status: blood pressure returned to baseline and stable Postop Assessment: no apparent nausea or vomiting Anesthetic complications: no     Last Vitals:  Vitals:   05/21/19 1812 05/21/19 1820  BP: (!) 147/64 (!) 149/55  Pulse: 70 75  Resp: 17 18  Temp:  36.9 C  SpO2: 98% 99%    Last Pain:  Vitals:   05/21/19 1820  TempSrc:   PainSc: 0-No pain                 Durenda Hurt

## 2019-05-26 NOTE — Discharge Summary (Signed)
NAMEEMRIE, STIRN MEDICAL RECORD [id number] ACCOUNT [id number] DATE OF BIRTH:31-Dec-1950 FACILITY: [facility] LOCATION: [facility] PHYSICIAN:THOMAS Josefine Class, MD  DISCHARGE SUMMARY  DATE OF DISCHARGE:  05/21/2019  ADDENDUM  Spent 2 hours with coordinating transfer to [facility], direct counseling, coordination of care, and discussion with the patient.  LN/NUANCE D:05/26/2019 T:05/26/2019 JOB:[id number]

## 2019-06-01 DIAGNOSIS — D62 Acute posthemorrhagic anemia: Secondary | ICD-10-CM | POA: Insufficient documentation

## 2019-06-04 DIAGNOSIS — I358 Other nonrheumatic aortic valve disorders: Secondary | ICD-10-CM | POA: Insufficient documentation

## 2019-06-11 DIAGNOSIS — E559 Vitamin D deficiency, unspecified: Secondary | ICD-10-CM | POA: Insufficient documentation

## 2019-06-14 DIAGNOSIS — R9431 Abnormal electrocardiogram [ECG] [EKG]: Secondary | ICD-10-CM | POA: Insufficient documentation

## 2019-06-18 DIAGNOSIS — A0472 Enterocolitis due to Clostridium difficile, not specified as recurrent: Secondary | ICD-10-CM

## 2019-06-23 ENCOUNTER — Inpatient Hospital Stay: Payer: Medicare Other | Attending: Obstetrics and Gynecology

## 2019-06-23 MED ORDER — NYSTATIN 100000 UNIT/GM EX POWD
CUTANEOUS | Status: DC
Start: 2019-06-23 — End: 2019-06-23

## 2019-06-23 MED ORDER — ENOXAPARIN SODIUM 40 MG/0.4ML ~~LOC~~ SOLN
40.00 | SUBCUTANEOUS | Status: DC
Start: 2019-06-23 — End: 2019-06-23

## 2019-06-23 MED ORDER — MENTHOL 7.6 MG MT LOZG
1.00 | LOZENGE | OROMUCOSAL | Status: DC
Start: ? — End: 2019-06-23

## 2019-06-23 MED ORDER — GENERIC EXTERNAL MEDICATION
Status: DC
Start: ? — End: 2019-06-23

## 2019-06-23 MED ORDER — VANCOMYCIN HCL 250 MG/5ML PO SOLR
125.00 | ORAL | Status: DC
Start: 2019-06-23 — End: 2019-06-23

## 2019-06-23 MED ORDER — SIMETHICONE 80 MG PO CHEW
80.00 | CHEWABLE_TABLET | ORAL | Status: DC
Start: ? — End: 2019-06-23

## 2019-06-23 MED ORDER — LIDOCAINE 5 % EX PTCH
1.00 | MEDICATED_PATCH | CUTANEOUS | Status: DC
Start: 2019-06-24 — End: 2019-06-23

## 2019-06-23 MED ORDER — TRAMADOL HCL 50 MG PO TABS
50.00 | ORAL_TABLET | ORAL | Status: DC
Start: ? — End: 2019-06-23

## 2019-06-23 MED ORDER — GENERIC EXTERNAL MEDICATION
650.00 | Status: DC
Start: ? — End: 2019-06-23

## 2019-06-23 MED ORDER — GENERIC EXTERNAL MEDICATION
50000.00 | Status: DC
Start: 2019-06-25 — End: 2019-06-23

## 2019-06-23 MED ORDER — SENNOSIDES-DOCUSATE SODIUM 8.6-50 MG PO TABS
2.00 | ORAL_TABLET | ORAL | Status: DC
Start: ? — End: 2019-06-23

## 2019-06-23 MED ORDER — HYDROCHLOROTHIAZIDE 25 MG PO TABS
25.00 | ORAL_TABLET | ORAL | Status: DC
Start: 2019-06-24 — End: 2019-06-23

## 2019-06-23 MED ORDER — GENERIC EXTERNAL MEDICATION
12.50 | Status: DC
Start: ? — End: 2019-06-23

## 2019-06-23 MED ORDER — METRONIDAZOLE 250 MG PO TABS
500.00 | ORAL_TABLET | ORAL | Status: DC
Start: 2019-06-23 — End: 2019-06-23

## 2019-06-23 MED ORDER — HYDROMORPHONE HCL 1 MG/ML IJ SOLN
0.50 | INTRAMUSCULAR | Status: DC
Start: ? — End: 2019-06-23

## 2019-06-23 MED ORDER — GENERIC EXTERNAL MEDICATION
1.00 | Status: DC
Start: 2019-06-23 — End: 2019-06-23

## 2019-06-23 MED ORDER — CULTURELLE PO CAPS
1.00 | ORAL_CAPSULE | ORAL | Status: DC
Start: 2019-06-24 — End: 2019-06-23

## 2019-06-23 MED ORDER — GENERIC EXTERNAL MEDICATION
0.75 | Status: DC
Start: 2019-06-23 — End: 2019-06-23

## 2019-06-23 MED ORDER — IBUPROFEN 600 MG PO TABS
600.00 | ORAL_TABLET | ORAL | Status: DC
Start: 2019-06-23 — End: 2019-06-23

## 2019-07-05 ENCOUNTER — Other Ambulatory Visit: Payer: Self-pay

## 2019-07-05 DIAGNOSIS — C541 Malignant neoplasm of endometrium: Secondary | ICD-10-CM

## 2019-07-05 NOTE — Progress Notes (Signed)
Referral entered for radiation oncology at the request of Dr. Fransisca Connors. Follow up appointment arranged with Dr. Fransisca Connors.

## 2019-07-27 ENCOUNTER — Other Ambulatory Visit: Payer: Self-pay

## 2019-07-28 ENCOUNTER — Other Ambulatory Visit: Payer: Self-pay

## 2019-07-28 ENCOUNTER — Encounter: Payer: Self-pay | Admitting: Radiation Oncology

## 2019-07-28 ENCOUNTER — Ambulatory Visit: Payer: Medicare Other

## 2019-07-28 ENCOUNTER — Ambulatory Visit
Admission: RE | Admit: 2019-07-28 | Discharge: 2019-07-28 | Disposition: A | Discharge status: Home / Self Care | Payer: Medicare Other | Source: Ambulatory Visit | Attending: Radiation Oncology | Admitting: Radiation Oncology

## 2019-07-28 ENCOUNTER — Inpatient Hospital Stay: Payer: Medicare Other | Attending: Obstetrics and Gynecology | Admitting: Obstetrics and Gynecology

## 2019-07-28 VITALS — BP 155/61 | HR 70 | Temp 98.7°F | Resp 18

## 2019-07-28 VITALS — Ht 60.0 in | Wt 255.0 lb

## 2019-07-28 DIAGNOSIS — C7982 Secondary malignant neoplasm of genital organs: Secondary | ICD-10-CM | POA: Diagnosis not present

## 2019-07-28 DIAGNOSIS — C541 Malignant neoplasm of endometrium: Secondary | ICD-10-CM

## 2019-07-28 DIAGNOSIS — M129 Arthropathy, unspecified: Secondary | ICD-10-CM | POA: Insufficient documentation

## 2019-07-28 DIAGNOSIS — M1990 Unspecified osteoarthritis, unspecified site: Secondary | ICD-10-CM | POA: Diagnosis not present

## 2019-07-28 DIAGNOSIS — A0472 Enterocolitis due to Clostridium difficile, not specified as recurrent: Secondary | ICD-10-CM | POA: Insufficient documentation

## 2019-07-28 DIAGNOSIS — I10 Essential (primary) hypertension: Secondary | ICD-10-CM | POA: Insufficient documentation

## 2019-07-28 DIAGNOSIS — Z90722 Acquired absence of ovaries, bilateral: Secondary | ICD-10-CM | POA: Insufficient documentation

## 2019-07-28 DIAGNOSIS — Z79899 Other long term (current) drug therapy: Secondary | ICD-10-CM | POA: Insufficient documentation

## 2019-07-28 DIAGNOSIS — Z90710 Acquired absence of both cervix and uterus: Secondary | ICD-10-CM | POA: Insufficient documentation

## 2019-07-28 DIAGNOSIS — E669 Obesity, unspecified: Secondary | ICD-10-CM | POA: Diagnosis not present

## 2019-07-28 DIAGNOSIS — C55 Malignant neoplasm of uterus, part unspecified: Secondary | ICD-10-CM | POA: Diagnosis not present

## 2019-07-28 MED ORDER — HYDROCHLOROTHIAZIDE 25 MG PO TABS: 25.0000 mg | ORAL_TABLET | Freq: Every day | ORAL | 0 refills | Status: AC

## 2019-07-28 NOTE — Progress Notes (Signed)
Gynecologic Oncology Consult Visit   Referring Provider: Dr Ouida Sills  Chief Concern: Dedifferentiated uterine cancer, with new vaginal recurrence  Subjective:  Olivia Rodriguez is a 68 y.o. P1 divorced female seen in consultation from Dr. Ouida Sills for undifferentiated endometrial carcinoma.   Went home with wound vac due to lower part of incision opening up and almost closed now. Only takes ibuprofen and tramadol for pain.  Eating well and no GI or GU complaints. Diagnosed with C diff again in the hospital. No diarrhea now and has another month of vancomycin to finish. Considering fecal transplant.   She has developed some vaginal bleeding over the last few weeks. Saw Dr. Baruch Gouty today for consideration of pelvic radiation.    Oncology Hx:  Patient states she had been bleeding intermittently for four years. EMB was attempted in 09/2018, wasn't successful and she was scheduled for hysteroscopy and D&C the same month. The surgery was delayed after she had hip surgery and had an extended recovery at a rehab facility. While she was recovering, she developed a URI and was given azithromycin and developed C. Diff, and was treated with oral vancomycin. She became further deconditioned at this point, and was in consideration for a fecal transplant. She was able to recover after this and was finally discharged from the rehab facility in May. Her hysteroscopy was pushed back until September. During the hysteroscopy, Dr Ouida Sills encountered brisk bleeding with an EBL of 300, and her endometrium was suspicious for cancer. Patient was given 1 unit PRBCs, TXA, and her vagina was packed. She was then transferred directly to [facility], where her bleeding stabilized and she was able to be discharged. 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 CT 05/23/19 with enlarged mediastinal and hilar adenopathy concerning for metastatic disease. Underwent surgery 06/08/19 with open removal of  uterus, tubes and ovaries and enlarged right pelvic LN. Almost full thickness invasion with gross cervical involvement. Went home 10/31 after slow recovery including recurrent C diff infection and wound infection.   Pathology A. Uterus, bilateral ovaries and fallopian tubes, hysterectomy and bilateral salpingo-oophorectomy:  Dedifferentiated carcinoma of the endometrium.   Tumor size: 9.7 cm. Depth of invasion: 27 mm in a 29 mm thick myometrium. Cervical involvement: Present, with invasion of the cervical stroma. Adnexal involvement: Not identified. Lymphovascular invasion: Present. Surgical margins: Negative. Closest margin: 8 mm, anterior cervical stroma.  Note: Focally, the tumor has features of an endometrioid adenocarcinoma, with well-defined glands and cribriform architecture. However, the majority of the tumor is very poorly differentiated, with solid sheets of atypical spindle cells and a high mitotic rate. The two components of the tumor are distinct and do not intermix. These findings support a diagnosis of dedifferentiated carcinoma.  B. Right common iliac lymph node, excision: One lymph node, negative for malignancy (0/1).   Tumor Size Greatest dimension in Centimeters (cm): 9.7 Centimeters (cm)  Myometrial Invasion Present.  Depth of Invasion in Millimeters (mm) 27 Millimeters (mm)  Myometrial Thickness in Millimeters (mm) 29 Millimeters (mm)   Mismatch repair: INTACT A. Immunohistochemistry: Normal result. Expression of MLH1, MSH2, MSH6, and PMS2 is retained in the neoplastic cells. B. PCR: Microsatellite stable (MSS).   Problem List: Patient Active Problem List   Diagnosis Date Noted  . Endometrial cancer ([place]) 05/21/2019  . C. difficile diarrhea   . Clostridium difficile infection 11/05/2018  . Colitis 10/27/2018  . Status post total hip replacement, left 10/06/2018    Past Medical History: Past Medical History:  Diagnosis Date  .  Arthritis   .  Hypertension     Past Surgical History: Past Surgical History:  Procedure Laterality Date  . CESAREAN SECTION    . DILATION AND CURETTAGE OF UTERUS N/A 05/21/2019   Procedure: DILATATION AND CURETTAGE, FRACTIONAL;  Surgeon: Schermerhorn, Gwen Her, MD;  Location: [facility] ORS;  Service: Gynecology;  Laterality: N/A;  . JOINT REPLACEMENT    . REFRACTIVE SURGERY Bilateral 1999  . TONSILLECTOMY    . TOTAL HIP ARTHROPLASTY Left 10/06/2018   Procedure: TOTAL HIP ARTHROPLASTY ANTERIOR APPROACH-LEFT;  Surgeon: Hessie Knows, MD;  Location: [facility] ORS;  Service: Orthopedics;  Laterality: Left;    Family History: No family history on file.  Social History: Social History   Socioeconomic History  . Marital status: Single    Spouse name: Not on file  . Number of children: Not on file  . Years of education: Not on file  . Highest education level: Not on file  Occupational History  . Not on file  Social Needs  . Financial resource strain: Not on file  . Food insecurity    Worry: Not on file    Inability: Not on file  . Transportation needs    Medical: Not on file    Non-medical: Not on file  Tobacco Use  . Smoking status: Never Smoker  . Smokeless tobacco: Never Used  Substance and Sexual Activity  . Alcohol use: Not Currently    Comment: Occasional wine or mixed drink  . Drug use: Never  . Sexual activity: Not Currently  Lifestyle  . Physical activity    Days per week: Not on file    Minutes per session: Not on file  . Stress: Not on file  Relationships  . Social Herbalist on phone: Not on file    Gets together: Not on file    Attends religious service: Not on file    Active member of club or organization: Not on file    Attends meetings of clubs or organizations: Not on file    Relationship status: Not on file  . Intimate partner violence    Fear of current or ex partner: Not on file    Emotionally abused: Not on file    Physically abused: Not on file    Forced  sexual activity: Not on file  Other Topics Concern  . Not on file  Social History Narrative  . Not on file    Allergies: Allergies  Allergen Reactions  . Azithromycin     Other reaction(s): Other (See Comments) Patient states Zpak gave her C diff and she does not want this again  . Penicillins Hives and Other (See Comments)    Did it involve swelling of the face/tongue/throat, SOB, or low BP? No Did it involve sudden or severe rash/hives, skin peeling, or any reaction on the inside of your mouth or nose? Yes Did you need to seek medical attention at a hospital or doctor's office? Yes When did it last happen? Over 60 years ago If all above answers are "NO", may proceed with cephalosporin use.     Current Medications: Current Outpatient Medications  Medication Sig Dispense Refill  . hydrochlorothiazide (HYDRODIURIL) 25 MG tablet Take 1 tablet (25 mg total) by mouth daily. 30 tablet 0  . Potassium (GNP POTASSIUM) 99 MG TABS Take by mouth.    . saccharomyces boulardii (FLORASTOR) 250 MG capsule Take by mouth.    . vancomycin (VANCOCIN) 125 MG capsule Take 125 mg by mouth  every 6 (six) hours.    . traMADol (ULTRAM) 50 MG tablet Take by mouth.     No current facility-administered medications for this visit.     Review of Systems Per interval history  Objective:  Physical Examination:  Ht 5' 5" (1.651 m)   Wt 255 lb (115.7 kg)   BMI 42.43 kg/m    ECOG Performance Status: 2 - Symptomatic, <50% confined to bed  General appearance: alert, cooperative and appears stated age. Traveling by wheelchair.  HEENT:PERRLA and neck supple with midline trachea Lymph node survey: non-palpable, axillary, inguinal, supraclavicular Cardiovascular: regular rate and rhythm, no murmurs or gallops Respiratory: normal air entry, lungs clear to auscultation and no rales, rhonchi or wheezing Breast exam:  not examined. Abdomen: obese. Wound vac on small area of lower abdominal wound.  Rest is well  healed. Non-tender. Back: inspection of back is normal Extremities: extremities normal, atraumatic, no cyanosis or edema Skin exam - normal coloration and turgor, no rashes, no suspicious skin lesions noted. Neurological exam reveals alert, oriented, normal speech, no focal findings or movement disorder noted.  Pelvic: exam chaperoned by nurse;  Vulva: normal appearing vulva with no masses, tenderness or lesions; Vagina: dark blood seen and friable exophytic tumor about 3 cm seen at cuff.  Tumor fell off with sponge stick and sent for pathology. Bimanual: confirms.   The tumor at the vaginal apex was basically falling off and a piece was pulled out with a sponge stick.  Monsel's applied to stop bleeding.      Assessment:  Olivia Rodriguez is a 68 y.o. female diagnosed with Stage II dedifferentiated uterine cancer s/p TAH, BSO 06/08/19 with large cancer and cervical stromal involvement.  CT scan 05/23/19 had concern for mediastinal/hilar adenopathy.  Now with vaginal recurrence noted today and she is bleeding.    Superficial wound infection healing well with wound vac and now almost closed.   She had C diff post op and treated with antibiotics. She has another month of vancomycin treatment.  Some consideration for fecal transplant because it was her second episode.  MSS cancer.    Medical co-morbidities complicating care: morbid obesity, recurrent C diff.  Plan:   Problem List Items Addressed This Visit    None    Visit Diagnoses    Endometrial carcinoma ([place])    -  Primary   Relevant Medications   vancomycin (VANCOCIN) 125 MG capsule   Other Relevant Orders   Surgical pathology     Vaginal biopsy done today to confirm recurrence.  We discussed that she already has vaginal recurrence of dedifferentiated uterine cancer.  This is not surprising in view of bulky primary tumor.  Examined patient with Dr Baruch Gouty and he will start external pelvic radiation in the next few days.  We will also  order a PET/CT to reassess for metastatic disease, as there was concern for mediastinal/hilar adenopathy on CT 2 months ago.  We briefly discussed the possible need for chemotherapy after radiation if metastatic disease present.   The patient's diagnosis, an outline of the further diagnostic and laboratory studies which will be required, the recommendation, and alternatives were discussed.  All questions were answered to the patient's satisfaction.  Mellody Drown, MD

## 2019-07-28 NOTE — Progress Notes (Signed)
New patient visit for follow up with Dr Fransisca Connors post surgery. Pt has wound pump on and still having bleeding bright red, pink and beige at times.  Pt is also vancomycin po for c diff.

## 2019-07-28 NOTE — Consult Note (Signed)
NEW PATIENT EVALUATION  Name: Olivia Rodriguez  MRN: [id number]  Date:   07/28/2019     DOB: 1951/04/26   This 68 y.o. female patient presents to the clinic for initial evaluation of FIGO grade 2 dedifferentiated endometrial adenocarcinoma status post TAH/BSO and pelvic lymph node sampling.  REFERRING PHYSICIAN: Mellody Drown, MD  CHIEF COMPLAINT:  Chief Complaint  Patient presents with  . Cancer    Initial consultation     DIAGNOSIS: The encounter diagnosis was Endometrial cancer ([place]).   PREVIOUS INVESTIGATIONS:  CT scan reviewed Pathology report reviewed Clinical notes reviewed  HPI: Patient is an obese 68 year old female who has a history of postmenopausal bleeding over the past 4 years.  Patient has delayed biopsy secondary to hip surgery as well as complications of URI and C. difficile.  She was discharged from rehab in May and underwent hysteroscopy in September with brisk bleeding and endometrium suspicious for malignancy.  Biopsy at that time was positive for high-grade endometrial stromal's carcinoma.  She was seen by Dr. Fransisca Connors taken to [facility] underwent TAH/BSO and pelvic lymph node sampling.  Tumor measured 9.7 cm was dedifferentiated carcinoma with almost complete involvement of the myometrium.  There is 93% involvement.  Stromal thickness was 29 mm with 27 mm of invasion.  Lymphovascular invasion was present there is also involvement of the cervical stroma.  Vaginal cuff margin was clear at 8 mm.  She had 1 enlarged lymph node which was examined and negative for malignancy.  Her surgeries been complicated by made centimeter abscess along the midline of the surgical incision and she has been healing slowly over the course of time.  She is seen today for radiation oncology opinion.  She continues to have vaginal bleeding.  On examination today there was some friable tissue present at the vaginal cuff which was removed and sent for histologic examination.  PLANNED TREATMENT  REGIMEN: Whole pelvic radiation plus vaginal cuff boost with brachytherapy  PAST MEDICAL HISTORY:  has a past medical history of Arthritis and Hypertension.    PAST SURGICAL HISTORY:  Past Surgical History:  Procedure Laterality Date  . CESAREAN SECTION    . DILATION AND CURETTAGE OF UTERUS N/A 05/21/2019   Procedure: DILATATION AND CURETTAGE, FRACTIONAL;  Surgeon: Schermerhorn, Gwen Her, MD;  Location: [facility] ORS;  Service: Gynecology;  Laterality: N/A;  . JOINT REPLACEMENT    . REFRACTIVE SURGERY Bilateral 1999  . TONSILLECTOMY    . TOTAL HIP ARTHROPLASTY Left 10/06/2018   Procedure: TOTAL HIP ARTHROPLASTY ANTERIOR APPROACH-LEFT;  Surgeon: Hessie Knows, MD;  Location: [facility] ORS;  Service: Orthopedics;  Laterality: Left;    FAMILY HISTORY: family history is not on file.  SOCIAL HISTORY:  reports that she has never smoked. She has never used smokeless tobacco. She reports previous alcohol use. She reports that she does not use drugs.  ALLERGIES: Azithromycin and Penicillins  MEDICATIONS:  Current Outpatient Medications  Medication Sig Dispense Refill  . hydrochlorothiazide (HYDRODIURIL) 25 MG tablet Take 25 mg by mouth daily.    . Potassium (GNP POTASSIUM) 99 MG TABS Take by mouth.    . saccharomyces boulardii (FLORASTOR) 250 MG capsule Take by mouth.    . traMADol (ULTRAM) 50 MG tablet Take by mouth.    . vancomycin (VANCOCIN) 125 MG capsule Take 125 mg by mouth every 6 (six) hours.     No current facility-administered medications for this encounter.     ECOG PERFORMANCE STATUS:  1 - Symptomatic but completely ambulatory  REVIEW OF  SYSTEMS: Patient has morbid obesity. Patient denies any weight loss, fatigue, weakness, fever, chills or night sweats. Patient denies any loss of vision, blurred vision. Patient denies any ringing  of the ears or hearing loss. No irregular heartbeat. Patient denies heart murmur or history of fainting. Patient denies any chest pain or pain radiating to  her upper extremities. Patient denies any shortness of breath, difficulty breathing at night, cough or hemoptysis. Patient denies any swelling in the lower legs. Patient denies any nausea vomiting, vomiting of blood, or coffee ground material in the vomitus. Patient denies any stomach pain. Patient states has had normal bowel movements no significant constipation or diarrhea. Patient denies any dysuria, hematuria or significant nocturia. Patient denies any problems walking, swelling in the joints or loss of balance. Patient denies any skin changes, loss of hair or loss of weight. Patient denies any excessive worrying or anxiety or significant depression. Patient denies any problems with insomnia. Patient denies excessive thirst, polyuria, polydipsia. Patient denies any swollen glands, patient denies easy bruising or easy bleeding. Patient denies any recent infections, allergies or URI. Patient "s visual fields have not changed significantly in recent time.   PHYSICAL EXAM: BP (!) 155/61   Pulse 70   Temp 98.7 F (37.1 C)   Resp 18  Obese female in NAD on speculum examination friable tissue in the vaginal cuff with some bleeding.  No discrete nodularity on manual examination was seen.  Vaginal exam was performed in conjunction with Dr. Fransisca Connors.  Well-developed well-nourished patient in NAD. HEENT reveals PERLA, EOMI, discs not visualized.  Oral cavity is clear. No oral mucosal lesions are identified. Neck is clear without evidence of cervical or supraclavicular adenopathy. Lungs are clear to A&P. Cardiac examination is essentially unremarkable with regular rate and rhythm without murmur rub or thrill. Abdomen is benign with no organomegaly or masses noted. Motor sensory and DTR levels are equal and symmetric in the upper and lower extremities. Cranial nerves II through XII are grossly intact. Proprioception is intact. No peripheral adenopathy or edema is identified. No motor or sensory levels are noted.  Crude visual fields are within normal range.  LABORATORY DATA: Pathology report reviewed    RADIOLOGY RESULTS: Previous CT scans and CT scans at [facility] reviewed   IMPRESSION: FIGO stage II dedifferentiated endometrial adenocarcinoma status post TAH/BSO and pelvic lymph node biopsy in 68 year old female  PLAN: At this time I discussed the case personally with Dr. Fransisca Connors.  Based on her continued bleeding we will review her pathology on the recently removed friable tissue at the vaginal cuff.  I like to begin external beam radiation therapy to her whole pelvis.  Would plan on delivering 4500 cGy in 5 weeks.  We also offer vaginal brachytherapy to 1200 cGy in 3 fractions.  Risks and benefits of treatment including increased lower urinary tract symptoms diarrhea fatigue alteration of blood count skin reaction vaginal stenosis all were described in detail to the patient.  I have personally set up and ordered CT simulation for first thing next week.  We will try to help decrease some of her vaginal bleeding.  Patient comprehends my treatment plan well.  We have discussed ordering a PET CT scan for further staging and that will be ordered by Dr. Fransisca Connors.  I would like to take this opportunity to thank you for allowing me to participate in the care of your patient.Noreene Filbert, MD

## 2019-07-30 ENCOUNTER — Other Ambulatory Visit: Payer: Self-pay | Admitting: *Deleted

## 2019-07-30 ENCOUNTER — Other Ambulatory Visit: Payer: Self-pay | Admitting: Nurse Practitioner

## 2019-07-30 ENCOUNTER — Telehealth: Payer: Self-pay

## 2019-07-30 DIAGNOSIS — C541 Malignant neoplasm of endometrium: Secondary | ICD-10-CM

## 2019-07-30 LAB — SURGICAL PATHOLOGY

## 2019-07-30 NOTE — Telephone Encounter (Signed)
PET scan scheduled for 1230 with arrival time of 1200 at the medical mall entrance. Do not eat or drink for 6 hours prior except for water. We went over these instructions and instructions were emailed also at her request. Pathology reviewed from biopsy. All questions answered.   DIAGNOSIS:  A. VAGINAL TISSUE, BIOPSY:  - HIGH-GRADE MALIGNANT NEOPLASM, CONSISTENT WITH THE PATIENT'S KNOWN  HISTORY OF DEDIFFERENTIATED ENDOMETRIAL CARCINOMA.

## 2019-08-02 ENCOUNTER — Ambulatory Visit
Admission: RE | Admit: 2019-08-02 | Discharge: 2019-08-02 | Disposition: A | Discharge status: Home / Self Care | Payer: Medicare Other | Source: Ambulatory Visit | Attending: Radiation Oncology | Admitting: Radiation Oncology

## 2019-08-02 ENCOUNTER — Other Ambulatory Visit: Payer: Self-pay

## 2019-08-02 DIAGNOSIS — Z510 Encounter for antineoplastic radiation therapy: Secondary | ICD-10-CM | POA: Diagnosis not present

## 2019-08-02 DIAGNOSIS — C541 Malignant neoplasm of endometrium: Secondary | ICD-10-CM | POA: Diagnosis present

## 2019-08-03 DIAGNOSIS — C541 Malignant neoplasm of endometrium: Secondary | ICD-10-CM | POA: Diagnosis not present

## 2019-08-04 ENCOUNTER — Ambulatory Visit
Admission: RE | Admit: 2019-08-04 | Discharge: 2019-08-04 | Disposition: A | Discharge status: Home / Self Care | Payer: Medicare Other | Source: Ambulatory Visit | Attending: Radiation Oncology | Admitting: Radiation Oncology

## 2019-08-04 ENCOUNTER — Other Ambulatory Visit: Payer: Self-pay

## 2019-08-04 DIAGNOSIS — C541 Malignant neoplasm of endometrium: Secondary | ICD-10-CM | POA: Diagnosis not present

## 2019-08-05 ENCOUNTER — Other Ambulatory Visit: Payer: Self-pay

## 2019-08-05 ENCOUNTER — Ambulatory Visit
Admission: RE | Admit: 2019-08-05 | Discharge: 2019-08-05 | Disposition: A | Discharge status: Home / Self Care | Payer: Medicare Other | Source: Ambulatory Visit | Attending: Radiation Oncology | Admitting: Radiation Oncology

## 2019-08-05 DIAGNOSIS — C541 Malignant neoplasm of endometrium: Secondary | ICD-10-CM | POA: Diagnosis not present

## 2019-08-06 ENCOUNTER — Ambulatory Visit
Admission: RE | Admit: 2019-08-06 | Discharge: 2019-08-06 | Disposition: A | Discharge status: Home / Self Care | Payer: Medicare Other | Source: Ambulatory Visit | Attending: Radiation Oncology | Admitting: Radiation Oncology

## 2019-08-06 ENCOUNTER — Other Ambulatory Visit: Payer: Self-pay

## 2019-08-06 DIAGNOSIS — C541 Malignant neoplasm of endometrium: Secondary | ICD-10-CM | POA: Diagnosis not present

## 2019-08-09 ENCOUNTER — Other Ambulatory Visit: Payer: Self-pay

## 2019-08-09 ENCOUNTER — Ambulatory Visit
Admission: RE | Admit: 2019-08-09 | Discharge: 2019-08-09 | Disposition: A | Discharge status: Home / Self Care | Payer: Medicare Other | Source: Ambulatory Visit | Attending: Radiation Oncology | Admitting: Radiation Oncology

## 2019-08-09 DIAGNOSIS — C541 Malignant neoplasm of endometrium: Secondary | ICD-10-CM | POA: Diagnosis not present

## 2019-08-10 ENCOUNTER — Other Ambulatory Visit: Payer: Self-pay

## 2019-08-10 ENCOUNTER — Ambulatory Visit
Admission: RE | Admit: 2019-08-10 | Discharge: 2019-08-10 | Disposition: A | Discharge status: Home / Self Care | Payer: Medicare Other | Source: Ambulatory Visit | Attending: Radiation Oncology | Admitting: Radiation Oncology

## 2019-08-10 ENCOUNTER — Encounter
Admission: RE | Admit: 2019-08-10 | Discharge: 2019-08-10 | Disposition: A | Discharge status: Home / Self Care | Payer: Medicare Other | Source: Ambulatory Visit | Attending: Nurse Practitioner | Admitting: Nurse Practitioner

## 2019-08-10 DIAGNOSIS — C541 Malignant neoplasm of endometrium: Secondary | ICD-10-CM | POA: Diagnosis not present

## 2019-08-10 LAB — GLUCOSE, CAPILLARY: Glucose-Capillary: 96 mg/dL (ref 70–99)

## 2019-08-10 MED ORDER — FLUDEOXYGLUCOSE F - 18 (FDG) INJECTION
14.8000 | Freq: Once | INTRAVENOUS | Status: AC | PRN
  Administered 2019-08-10: 14.837 via INTRAVENOUS

## 2019-08-11 ENCOUNTER — Telehealth: Payer: Self-pay

## 2019-08-11 ENCOUNTER — Ambulatory Visit: Payer: Medicare Other

## 2019-08-11 NOTE — Telephone Encounter (Signed)
Received call from home health agency Olivia Rodriguez. They are reporting she no longer needs wound vac and they are using wet to dry dressings. They would like an order sent to  Home health to formally discontinue wound vac. Message sent to team at [facility].

## 2019-08-12 ENCOUNTER — Ambulatory Visit
Admission: RE | Admit: 2019-08-12 | Discharge: 2019-08-12 | Disposition: A | Discharge status: Home / Self Care | Payer: Medicare Other | Source: Ambulatory Visit | Attending: Radiation Oncology | Admitting: Radiation Oncology

## 2019-08-12 ENCOUNTER — Other Ambulatory Visit: Payer: Self-pay

## 2019-08-12 DIAGNOSIS — C541 Malignant neoplasm of endometrium: Secondary | ICD-10-CM | POA: Diagnosis not present

## 2019-08-13 ENCOUNTER — Other Ambulatory Visit: Payer: Self-pay

## 2019-08-13 ENCOUNTER — Ambulatory Visit
Admission: RE | Admit: 2019-08-13 | Discharge: 2019-08-13 | Disposition: A | Discharge status: Home / Self Care | Payer: Medicare Other | Source: Ambulatory Visit | Attending: Radiation Oncology | Admitting: Radiation Oncology

## 2019-08-13 DIAGNOSIS — C541 Malignant neoplasm of endometrium: Secondary | ICD-10-CM | POA: Diagnosis not present

## 2019-08-16 ENCOUNTER — Other Ambulatory Visit: Payer: Self-pay

## 2019-08-16 ENCOUNTER — Ambulatory Visit
Admission: RE | Admit: 2019-08-16 | Discharge: 2019-08-16 | Disposition: A | Discharge status: Home / Self Care | Payer: Medicare Other | Source: Ambulatory Visit | Attending: Radiation Oncology | Admitting: Radiation Oncology

## 2019-08-16 DIAGNOSIS — C541 Malignant neoplasm of endometrium: Secondary | ICD-10-CM | POA: Diagnosis not present

## 2019-08-17 ENCOUNTER — Other Ambulatory Visit: Payer: Self-pay

## 2019-08-17 ENCOUNTER — Ambulatory Visit
Admission: RE | Admit: 2019-08-17 | Discharge: 2019-08-17 | Disposition: A | Discharge status: Home / Self Care | Payer: Medicare Other | Source: Ambulatory Visit | Attending: Radiation Oncology | Admitting: Radiation Oncology

## 2019-08-17 DIAGNOSIS — C541 Malignant neoplasm of endometrium: Secondary | ICD-10-CM | POA: Diagnosis not present

## 2019-08-18 ENCOUNTER — Encounter
Admission: RE | Admit: 2019-08-18 | Discharge: 2019-08-18 | Disposition: A | Discharge status: Home / Self Care | Payer: Medicare Other | Source: Ambulatory Visit | Attending: Nurse Practitioner | Admitting: Nurse Practitioner

## 2019-08-18 ENCOUNTER — Ambulatory Visit
Admission: RE | Admit: 2019-08-18 | Discharge: 2019-08-18 | Disposition: A | Discharge status: Home / Self Care | Payer: Medicare Other | Source: Ambulatory Visit | Attending: Radiation Oncology | Admitting: Radiation Oncology

## 2019-08-18 ENCOUNTER — Other Ambulatory Visit: Payer: Self-pay

## 2019-08-18 ENCOUNTER — Inpatient Hospital Stay: Payer: Medicare Other

## 2019-08-18 DIAGNOSIS — R918 Other nonspecific abnormal finding of lung field: Secondary | ICD-10-CM | POA: Insufficient documentation

## 2019-08-18 DIAGNOSIS — C541 Malignant neoplasm of endometrium: Secondary | ICD-10-CM

## 2019-08-18 DIAGNOSIS — Z79899 Other long term (current) drug therapy: Secondary | ICD-10-CM | POA: Diagnosis not present

## 2019-08-18 LAB — CBC
HCT: 28.5 % — ABNORMAL LOW (ref 36.0–46.0)
Hemoglobin: 8.3 g/dL — ABNORMAL LOW (ref 12.0–15.0)
MCH: 25.4 pg — ABNORMAL LOW (ref 26.0–34.0)
MCHC: 29.1 g/dL — ABNORMAL LOW (ref 30.0–36.0)
MCV: 87.2 fL (ref 80.0–100.0)
Platelets: 258 10*3/uL (ref 150–400)
RBC: 3.27 MIL/uL — ABNORMAL LOW (ref 3.87–5.11)
RDW: 18.3 % — ABNORMAL HIGH (ref 11.5–15.5)
WBC: 4.3 10*3/uL (ref 4.0–10.5)
nRBC: 0 % (ref 0.0–0.2)

## 2019-08-18 LAB — GLUCOSE, CAPILLARY: Glucose-Capillary: 84 mg/dL (ref 70–99)

## 2019-08-18 MED ORDER — FLUDEOXYGLUCOSE F - 18 (FDG) INJECTION
13.2000 | Freq: Once | INTRAVENOUS | Status: AC | PRN
  Administered 2019-08-18: 13.15 via INTRAVENOUS

## 2019-08-19 ENCOUNTER — Other Ambulatory Visit: Payer: Self-pay | Admitting: Nurse Practitioner

## 2019-08-19 ENCOUNTER — Ambulatory Visit
Admission: RE | Admit: 2019-08-19 | Discharge: 2019-08-19 | Disposition: A | Discharge status: Home / Self Care | Payer: Medicare Other | Source: Ambulatory Visit | Attending: Radiation Oncology | Admitting: Radiation Oncology

## 2019-08-19 ENCOUNTER — Other Ambulatory Visit: Payer: Self-pay

## 2019-08-19 ENCOUNTER — Ambulatory Visit
Admission: RE | Admit: 2019-08-19 | Discharge: 2019-08-19 | Disposition: A | Discharge status: Home / Self Care | Payer: Self-pay | Source: Ambulatory Visit | Attending: Nurse Practitioner | Admitting: Nurse Practitioner

## 2019-08-19 DIAGNOSIS — C541 Malignant neoplasm of endometrium: Secondary | ICD-10-CM

## 2019-08-19 NOTE — Progress Notes (Signed)
I discussed with Dr. Theora Gianotti yesterday and Ms. Sliney today. I will discuss at tumor board. She is undergoing radiation at present. Screening for RUBY trial but it does not look like she meets criteria currently. At this time she adamantly does not want chemo.

## 2019-08-19 NOTE — Progress Notes (Signed)
PET results discussed with Dr. Theora Gianotti in Dr. Blake Divine absence. Recommend tumor board discussion. Dr. Theora Gianotti will have her screened for the RUBY trial. She is currently undergoing radiation with Dr. Baruch Gouty. I met with her today while she was here for radiation. Went over PET results in detail. I will discuss with radiology regarding biopsy of the larger lesion. It is only 18m in size. Ms. RBohlenis not interested in chemotherapy at this time but did mention interest in "lung drugs seen on tv" and lung radiation. Educated further that these nodules would be metastatic disease from her endometrial cancer and would need to be treated as endometrial. After discussion, I let her know that following tumor board we will call her or meet with her while here for radiation and go over all of her options and recommendations and she can make a more informed decision. [place] CT can be compare to current PET.  IMPRESSION: 1. No evidence of residual endometrial carcinoma in the pelvis. No evidence of metastatic disease in the abdomen or pelvis. 2. Unfortunately, there multiple bilateral pulmonary nodules of varying sizes. Some of the larger nodules do have faint metabolic activity. Findings are most consistent with METASTATIC ENDOMETRIAL CARCINOMA TO THE LUNGS.  Metabolic activity along the ventral midline abdominal wall is favored benign inflammation from abdominal surgery

## 2019-08-23 ENCOUNTER — Ambulatory Visit
Admission: RE | Admit: 2019-08-23 | Discharge: 2019-08-23 | Disposition: A | Discharge status: Home / Self Care | Payer: Medicare Other | Source: Ambulatory Visit | Attending: Radiation Oncology | Admitting: Radiation Oncology

## 2019-08-23 ENCOUNTER — Other Ambulatory Visit: Payer: Self-pay

## 2019-08-23 DIAGNOSIS — C541 Malignant neoplasm of endometrium: Secondary | ICD-10-CM | POA: Diagnosis not present

## 2019-08-24 ENCOUNTER — Ambulatory Visit
Admission: RE | Admit: 2019-08-24 | Discharge: 2019-08-24 | Disposition: A | Discharge status: Home / Self Care | Payer: Medicare Other | Source: Ambulatory Visit | Attending: Radiation Oncology | Admitting: Radiation Oncology

## 2019-08-24 ENCOUNTER — Other Ambulatory Visit: Payer: Self-pay

## 2019-08-24 ENCOUNTER — Ambulatory Visit: Payer: Medicare Other | Admitting: Radiation Oncology

## 2019-08-24 DIAGNOSIS — C541 Malignant neoplasm of endometrium: Secondary | ICD-10-CM | POA: Diagnosis not present

## 2019-08-25 ENCOUNTER — Ambulatory Visit: Payer: Medicare Other | Admitting: Radiation Oncology

## 2019-08-25 ENCOUNTER — Other Ambulatory Visit: Payer: Self-pay

## 2019-08-25 ENCOUNTER — Ambulatory Visit: Payer: Medicare Other

## 2019-08-25 ENCOUNTER — Inpatient Hospital Stay: Payer: Medicare Other

## 2019-08-25 ENCOUNTER — Ambulatory Visit
Admission: RE | Admit: 2019-08-25 | Discharge: 2019-08-25 | Disposition: A | Discharge status: Home / Self Care | Payer: Medicare Other | Source: Ambulatory Visit | Attending: Radiation Oncology | Admitting: Radiation Oncology

## 2019-08-25 DIAGNOSIS — C541 Malignant neoplasm of endometrium: Secondary | ICD-10-CM

## 2019-08-25 DIAGNOSIS — C7982 Secondary malignant neoplasm of genital organs: Secondary | ICD-10-CM | POA: Diagnosis not present

## 2019-08-25 LAB — CBC
HCT: 27.5 % — ABNORMAL LOW (ref 36.0–46.0)
Hemoglobin: 8.3 g/dL — ABNORMAL LOW (ref 12.0–15.0)
MCH: 25.5 pg — ABNORMAL LOW (ref 26.0–34.0)
MCHC: 30.2 g/dL (ref 30.0–36.0)
MCV: 84.6 fL (ref 80.0–100.0)
Platelets: 164 10*3/uL (ref 150–400)
RBC: 3.25 MIL/uL — ABNORMAL LOW (ref 3.87–5.11)
RDW: 18 % — ABNORMAL HIGH (ref 11.5–15.5)
WBC: 2.8 10*3/uL — ABNORMAL LOW (ref 4.0–10.5)
nRBC: 0 % (ref 0.0–0.2)

## 2019-08-26 ENCOUNTER — Other Ambulatory Visit: Payer: Self-pay

## 2019-08-26 ENCOUNTER — Ambulatory Visit
Admission: RE | Admit: 2019-08-26 | Discharge: 2019-08-26 | Disposition: A | Discharge status: Home / Self Care | Payer: Medicare Other | Source: Ambulatory Visit | Attending: Radiation Oncology | Admitting: Radiation Oncology

## 2019-08-26 DIAGNOSIS — C541 Malignant neoplasm of endometrium: Secondary | ICD-10-CM | POA: Diagnosis not present

## 2019-08-30 ENCOUNTER — Ambulatory Visit
Admission: RE | Admit: 2019-08-30 | Discharge: 2019-08-30 | Disposition: A | Discharge status: Home / Self Care | Payer: Medicare Other | Source: Ambulatory Visit | Attending: Radiation Oncology | Admitting: Radiation Oncology

## 2019-08-30 ENCOUNTER — Other Ambulatory Visit: Payer: Self-pay

## 2019-08-30 DIAGNOSIS — C541 Malignant neoplasm of endometrium: Secondary | ICD-10-CM | POA: Diagnosis present

## 2019-08-30 DIAGNOSIS — Z510 Encounter for antineoplastic radiation therapy: Secondary | ICD-10-CM | POA: Diagnosis not present

## 2019-08-31 ENCOUNTER — Other Ambulatory Visit: Payer: Self-pay

## 2019-08-31 ENCOUNTER — Ambulatory Visit
Admission: RE | Admit: 2019-08-31 | Discharge: 2019-08-31 | Disposition: A | Discharge status: Home / Self Care | Payer: Medicare Other | Source: Ambulatory Visit | Attending: Radiation Oncology | Admitting: Radiation Oncology

## 2019-08-31 DIAGNOSIS — C541 Malignant neoplasm of endometrium: Secondary | ICD-10-CM | POA: Diagnosis not present

## 2019-09-01 ENCOUNTER — Other Ambulatory Visit: Payer: Self-pay

## 2019-09-01 ENCOUNTER — Inpatient Hospital Stay: Payer: Medicare Other | Attending: Obstetrics and Gynecology

## 2019-09-01 ENCOUNTER — Ambulatory Visit
Admission: RE | Admit: 2019-09-01 | Discharge: 2019-09-01 | Disposition: A | Discharge status: Home / Self Care | Payer: Medicare Other | Source: Ambulatory Visit | Attending: Radiation Oncology | Admitting: Radiation Oncology

## 2019-09-01 DIAGNOSIS — Z90722 Acquired absence of ovaries, bilateral: Secondary | ICD-10-CM | POA: Diagnosis not present

## 2019-09-01 DIAGNOSIS — Z79899 Other long term (current) drug therapy: Secondary | ICD-10-CM | POA: Diagnosis not present

## 2019-09-01 DIAGNOSIS — C7800 Secondary malignant neoplasm of unspecified lung: Secondary | ICD-10-CM | POA: Diagnosis not present

## 2019-09-01 DIAGNOSIS — R339 Retention of urine, unspecified: Secondary | ICD-10-CM | POA: Diagnosis not present

## 2019-09-01 DIAGNOSIS — M129 Arthropathy, unspecified: Secondary | ICD-10-CM | POA: Diagnosis not present

## 2019-09-01 DIAGNOSIS — A0471 Enterocolitis due to Clostridium difficile, recurrent: Secondary | ICD-10-CM | POA: Insufficient documentation

## 2019-09-01 DIAGNOSIS — Z79811 Long term (current) use of aromatase inhibitors: Secondary | ICD-10-CM | POA: Diagnosis not present

## 2019-09-01 DIAGNOSIS — E559 Vitamin D deficiency, unspecified: Secondary | ICD-10-CM | POA: Insufficient documentation

## 2019-09-01 DIAGNOSIS — I10 Essential (primary) hypertension: Secondary | ICD-10-CM | POA: Insufficient documentation

## 2019-09-01 DIAGNOSIS — E876 Hypokalemia: Secondary | ICD-10-CM | POA: Diagnosis not present

## 2019-09-01 DIAGNOSIS — D500 Iron deficiency anemia secondary to blood loss (chronic): Secondary | ICD-10-CM | POA: Diagnosis not present

## 2019-09-01 DIAGNOSIS — C541 Malignant neoplasm of endometrium: Secondary | ICD-10-CM | POA: Insufficient documentation

## 2019-09-01 DIAGNOSIS — C7982 Secondary malignant neoplasm of genital organs: Secondary | ICD-10-CM | POA: Diagnosis not present

## 2019-09-01 DIAGNOSIS — Z90710 Acquired absence of both cervix and uterus: Secondary | ICD-10-CM | POA: Diagnosis not present

## 2019-09-01 DIAGNOSIS — Z923 Personal history of irradiation: Secondary | ICD-10-CM | POA: Diagnosis not present

## 2019-09-01 LAB — CBC
HCT: 27.5 % — ABNORMAL LOW (ref 36.0–46.0)
Hemoglobin: 7.9 g/dL — ABNORMAL LOW (ref 12.0–15.0)
MCH: 25.1 pg — ABNORMAL LOW (ref 26.0–34.0)
MCHC: 28.7 g/dL — ABNORMAL LOW (ref 30.0–36.0)
MCV: 87.3 fL (ref 80.0–100.0)
Platelets: 354 10*3/uL (ref 150–400)
RBC: 3.15 MIL/uL — ABNORMAL LOW (ref 3.87–5.11)
RDW: 17.6 % — ABNORMAL HIGH (ref 11.5–15.5)
WBC: 4.7 10*3/uL (ref 4.0–10.5)
nRBC: 0 % (ref 0.0–0.2)

## 2019-09-02 ENCOUNTER — Ambulatory Visit
Admission: RE | Admit: 2019-09-02 | Discharge: 2019-09-02 | Disposition: A | Discharge status: Home / Self Care | Payer: Medicare Other | Source: Ambulatory Visit | Attending: Radiation Oncology | Admitting: Radiation Oncology

## 2019-09-02 ENCOUNTER — Other Ambulatory Visit: Payer: Self-pay

## 2019-09-02 ENCOUNTER — Other Ambulatory Visit: Payer: Medicare Other

## 2019-09-02 DIAGNOSIS — C541 Malignant neoplasm of endometrium: Secondary | ICD-10-CM

## 2019-09-02 NOTE — Progress Notes (Signed)
Went over outcome of tumor board with Olivia Rodriguez. She is open to meeting with medical oncology to discuss all of her treatment options. Referral placed to medical oncology.

## 2019-09-02 NOTE — Progress Notes (Signed)
Tumor Board Documentation  Olivia Rodriguez was presented by Hortencia Pilar at our Tumor Board on 09/02/2019, which included representatives from medical oncology, radiation oncology, navigation, pathology, radiology, surgical, surgical oncology, internal medicine, pharmacy, genetics, palliative care, research.  Olivia Rodriguez currently presents for [place], for new positive pathology, as a current patient with history of the following treatments: active survellience, surgical intervention(s), adjuvant radiation.  Additionally, we reviewed previous medical and familial history, history of present illness, and recent lab results along with all available histopathologic and imaging studies. The tumor board considered available treatment options and made the following recommendations: Chemotherapy Refer to [facility] to discuss potential Chemotherapy  The following procedures/referrals were also placed: No orders of the defined types were placed in this encounter.   Clinical Trial Status: not eligible   Staging used: Pathologic Stage  AJCC Staging: T: 2     Group: Stage 2 Endometrial Carcoinoma with lung Nodules   National site-specific guidelines NCCN were discussed with respect to the case.  Tumor board is a meeting of clinicians from various specialty areas who evaluate and discuss patients for whom a multidisciplinary approach is being considered. Final determinations in the plan of care are those of the provider(s). The responsibility for follow up of recommendations given during tumor board is that of the provider.   Today's extended care, comprehensive team conference, Olivia Rodriguez was not present for the discussion and was not examined.   Multidisciplinary Tumor Board is a multidisciplinary case peer review process.  Decisions discussed in the Multidisciplinary Tumor Board reflect the opinions of the specialists present at the conference without having examined the patient.  Ultimately, treatment and  diagnostic decisions rest with the primary provider(s) and the patient.

## 2019-09-03 ENCOUNTER — Other Ambulatory Visit: Payer: Self-pay

## 2019-09-03 ENCOUNTER — Ambulatory Visit
Admission: RE | Admit: 2019-09-03 | Discharge: 2019-09-03 | Disposition: A | Discharge status: Home / Self Care | Payer: Medicare Other | Source: Ambulatory Visit | Attending: Radiation Oncology | Admitting: Radiation Oncology

## 2019-09-03 DIAGNOSIS — C541 Malignant neoplasm of endometrium: Secondary | ICD-10-CM | POA: Diagnosis not present

## 2019-09-06 ENCOUNTER — Telehealth: Payer: Self-pay

## 2019-09-06 ENCOUNTER — Other Ambulatory Visit: Payer: Self-pay

## 2019-09-06 ENCOUNTER — Ambulatory Visit
Admission: RE | Admit: 2019-09-06 | Discharge: 2019-09-06 | Disposition: A | Discharge status: Home / Self Care | Payer: Medicare Other | Source: Ambulatory Visit | Attending: Radiation Oncology | Admitting: Radiation Oncology

## 2019-09-06 ENCOUNTER — Ambulatory Visit: Payer: Medicare Other

## 2019-09-06 DIAGNOSIS — C541 Malignant neoplasm of endometrium: Secondary | ICD-10-CM | POA: Diagnosis not present

## 2019-09-06 NOTE — Telephone Encounter (Signed)
Olivia Rodriguez was presented at tumor board. Radiology reviewed CT chest from 05-23-19 to PET 08/18/19. They report that the concerning mediastinal/hilar adenopathy has resolved, however scan is without contrast. Bigger concern are the multiple new lung nodules bilateral. Pending appointment with medical oncology and reviewed follow up appointment this week with gyn onc.

## 2019-09-07 ENCOUNTER — Other Ambulatory Visit: Payer: Self-pay

## 2019-09-07 ENCOUNTER — Ambulatory Visit
Admission: RE | Admit: 2019-09-07 | Discharge: 2019-09-07 | Disposition: A | Discharge status: Home / Self Care | Payer: Medicare Other | Source: Ambulatory Visit | Attending: Radiation Oncology | Admitting: Radiation Oncology

## 2019-09-07 DIAGNOSIS — C541 Malignant neoplasm of endometrium: Secondary | ICD-10-CM | POA: Diagnosis not present

## 2019-09-07 NOTE — Progress Notes (Signed)
Patient pre screened for office appointment, no questions or concerns today. Patient reminded of upcoming appointment time and date. 

## 2019-09-08 ENCOUNTER — Other Ambulatory Visit: Payer: Self-pay

## 2019-09-08 ENCOUNTER — Inpatient Hospital Stay: Payer: Medicare Other

## 2019-09-08 ENCOUNTER — Inpatient Hospital Stay (HOSPITAL_BASED_OUTPATIENT_CLINIC_OR_DEPARTMENT_OTHER): Payer: Medicare Other | Admitting: Obstetrics and Gynecology

## 2019-09-08 ENCOUNTER — Encounter: Payer: Self-pay | Admitting: Obstetrics and Gynecology

## 2019-09-08 ENCOUNTER — Ambulatory Visit
Admission: RE | Admit: 2019-09-08 | Discharge: 2019-09-08 | Disposition: A | Discharge status: Home / Self Care | Payer: Medicare Other | Source: Ambulatory Visit | Attending: Radiation Oncology | Admitting: Radiation Oncology

## 2019-09-08 VITALS — BP 160/73 | HR 65 | Temp 98.5°F | Resp 18 | Wt 255.0 lb

## 2019-09-08 DIAGNOSIS — C7800 Secondary malignant neoplasm of unspecified lung: Secondary | ICD-10-CM

## 2019-09-08 DIAGNOSIS — C541 Malignant neoplasm of endometrium: Secondary | ICD-10-CM

## 2019-09-08 DIAGNOSIS — N950 Postmenopausal bleeding: Secondary | ICD-10-CM | POA: Insufficient documentation

## 2019-09-08 DIAGNOSIS — C7982 Secondary malignant neoplasm of genital organs: Secondary | ICD-10-CM | POA: Diagnosis not present

## 2019-09-08 NOTE — Progress Notes (Signed)
Gynecologic Oncology Interval Visit   Referring Provider: Dr Ouida Sills  Chief Concern: Dedifferentiated uterine cancer, with vaginal recurrence and concern for lung metastases  Subjective:  Olivia Rodriguez is a 68 y.o. P1 divorced female, initially seen in consultation from Dr. Ouida Sills for undifferentiated endometrial carcinoma, returns to clinic for discussion of management options/treatment planning.    She has a history of postmenopausal bleeding for 4 years and delayed biopsy secondary to hip surgery followed by complications of URI and C. difficile.  She underwent hysteroscopy in September.  Pathology suspicious for malignancy.  Positive for high-grade endometrial stromal carcinoma.  She underwent TAH-BSO and pelvic lymph node sampling 06/08/19.  Tumor measured 9.7 cm, dedifferentiated carcinoma with almost complete involvement of the myometrium (93%- 36m of 29 mm).  LVSI was present in cervical stroma.  Vaginal cuff margin was clear at 8 mm.  1 enlarged lymph node was negative for malignancy.  She experienced delayed wound healing and had wound VAC placed.  She saw Drs. BPocasseton 07/28/2019.  She was experiencing vaginal bleeding at that time.  On exam, friable tissue at the vaginal cuff and biopsy showed recurrence.   She underwent external beam pelvic radiation 4500 cGy in 5 weeks to be followed by vaginal brachytherapy to 1200 cGy in 3 fractions.  She initiated radiation on 08/04/2019.   08/18/2019- PET did not reveal evidence of residual endometrial carcinoma in the pelvis.  No evidence of metastatic disease in the abdomen or pelvis.  However, multiple bilateral pulmonary nodules of varying sizes.  Some of the larger nodules have faint metabolic activity and thought to be consistent with metastatic endometrial carcinoma. Previously seen hilar adenopathy was resolved.   In the interim, wound vac has been discontinued. Case was discussed at tumor board with recommendation  to be followed with medical oncology and gynecologic oncology to consider chemotherapy for lung metastases. She is scheduled to see Dr. BRogue Bussingon 09/10/2019.   Oncology Hx:  Patient states she had been bleeding intermittently for four years. EMB was attempted in 09/2018, wasn't successful and she was scheduled for hysteroscopy and D&C the same month. The surgery was delayed after she had hip surgery and had an extended recovery at a rehab facility. While she was recovering, she developed a URI and was given azithromycin and developed C. Diff, and was treated with oral vancomycin. She became further deconditioned at this point, and was in consideration for a fecal transplant. She was able to recover after this and was finally discharged from the rehab facility in May. Her hysteroscopy was pushed back until September. During the hysteroscopy, Dr SOuida Sillsencountered brisk bleeding with an EBL of 300, and her endometrium was suspicious for cancer. Patient was given 1 unit PRBCs, TXA, and her vagina was packed. [facility] and she was able to be discharged. 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 CT 05/23/19 with enlarged mediastinal and hilar adenopathy concerning for metastatic disease. Underwent surgery 06/08/19 with open removal of uterus, tubes and ovaries and enlarged right pelvic LN. Almost full thickness invasion with gross cervical involvement. Went home 10/31 after slow recovery including recurrent C diff infection and wound infection.   Pathology A. Uterus, bilateral ovaries and fallopian tubes, hysterectomy and bilateral salpingo-oophorectomy:  Dedifferentiated carcinoma of the endometrium.   Tumor size: 9.7 cm. Depth of invasion: 27 mm in a 29 mm thick myometrium. Cervical involvement: Present, with invasion of the cervical stroma.  Adnexal involvement: Not identified. Lymphovascular invasion:  Present. Surgical margins: Negative. Closest margin: 8 mm, anterior cervical stroma.  Note: Focally, the tumor has features of an endometrioid adenocarcinoma, with well-defined glands and cribriform architecture. However, the majority of the tumor is very poorly differentiated, with solid sheets of atypical spindle cells and a high mitotic rate. The two components of the tumor are distinct and do not intermix. These findings support a diagnosis of dedifferentiated carcinoma.  B. Right common iliac lymph node, excision: One lymph node, negative for malignancy (0/1).   Tumor Size Greatest dimension in Centimeters (cm): 9.7 Centimeters (cm)  Myometrial Invasion Present.  Depth of Invasion in Millimeters (mm) 27 Millimeters (mm)  Myometrial Thickness in Millimeters (mm) 29 Millimeters (mm)   Mismatch repair: INTACT A. Immunohistochemistry: Normal result. Expression of MLH1, MSH2, MSH6, and PMS2 is retained in the neoplastic cells. B. PCR: Microsatellite stable (MSS).  Diagnosed with C diff again in the hospital. No diarrhea now and has another month of vancomycin to finish. Considering fecal transplant.    Problem List: Patient Active Problem List   Diagnosis Date Noted  . PMB (postmenopausal bleeding) 09/08/2019  . C. difficile diarrhea 06/18/2019  . QT prolongation 06/14/2019  . Vitamin D deficiency 06/11/2019  . Aortic heart murmur 06/04/2019  . Acute blood loss anemia 06/01/2019  . Hypokalemia 05/23/2019  . Urinary retention with incomplete bladder emptying 05/23/2019  . Endometrial cancer ([place]) 05/21/2019  . Endometrial stromal sarcoma ([place]) 05/21/2019  . Postoperative anemia 05/21/2019  . Clostridium difficile infection 11/05/2018  . Colitis 10/27/2018  . Status post total hip replacement, left 10/06/2018  . Essential hypertension 10/05/2018  . BMI 45.0-49.9, adult ([place]) 09/02/2018    Past Medical History: Past Medical History:  Diagnosis Date  . Arthritis   .  Hypertension     Past Surgical History: Past Surgical History:  Procedure Laterality Date  . CESAREAN SECTION    . DILATION AND CURETTAGE OF UTERUS N/A 05/21/2019   Procedure: DILATATION AND CURETTAGE, FRACTIONAL;  Surgeon: Schermerhorn, Gwen Her, MD;  Location: [facility] ORS;  Service: Gynecology;  Laterality: N/A;  . JOINT REPLACEMENT    . REFRACTIVE SURGERY Bilateral 1999  . TONSILLECTOMY    . TOTAL HIP ARTHROPLASTY Left 10/06/2018   Procedure: TOTAL HIP ARTHROPLASTY ANTERIOR APPROACH-LEFT;  Surgeon: Hessie Knows, MD;  Location: [facility] ORS;  Service: Orthopedics;  Laterality: Left;    Family History: No family history on file.  Social History: Social History   Socioeconomic History  . Marital status: Single    Spouse name: Not on file  . Number of children: Not on file  . Years of education: Not on file  . Highest education level: Not on file  Occupational History  . Not on file  Tobacco Use  . Smoking status: Never Smoker  . Smokeless tobacco: Never Used  Substance and Sexual Activity  . Alcohol use: Not Currently    Comment: Occasional wine or mixed drink  . Drug use: Never  . Sexual activity: Not Currently  Other Topics Concern  . Not on file  Social History Narrative  . Not on file   Social Determinants of Health   Financial Resource Strain:   . Difficulty of Paying Living Expenses: Not on file  Food Insecurity:   . Worried About Charity fundraiser in the Last Year: Not on file  . Ran Out of Food in the Last Year: Not on file  Transportation Needs:   . Lack of Transportation (Medical):  Not on file  . Lack of Transportation (Non-Medical): Not on file  Physical Activity:   . Days of Exercise per Week: Not on file  . Minutes of Exercise per Session: Not on file  Stress:   . Feeling of Stress : Not on file  Social Connections:   . Frequency of Communication with Friends and Family: Not on file  . Frequency of Social Gatherings with Friends and Family: Not on  file  . Attends Religious Services: Not on file  . Active Member of Clubs or Organizations: Not on file  . Attends Archivist Meetings: Not on file  . Marital Status: Not on file  Intimate Partner Violence:   . Fear of Current or Ex-Partner: Not on file  . Emotionally Abused: Not on file  . Physically Abused: Not on file  . Sexually Abused: Not on file    Allergies: Allergies  Allergen Reactions  . Azithromycin     Other reaction(s): Other (See Comments) Patient states Zpak gave her C diff and she does not want this again    Current Medications: Current Outpatient Medications  Medication Sig Dispense Refill  . hydrochlorothiazide (HYDRODIURIL) 25 MG tablet Take 1 tablet (25 mg total) by mouth daily. 30 tablet 0  . ibuprofen (ADVIL) 200 MG tablet Take 200 mg by mouth every 6 (six) hours as needed.    . Potassium (GNP POTASSIUM) 99 MG TABS Take by mouth.    . saccharomyces boulardii (FLORASTOR) 250 MG capsule Take by mouth.    Marland Kitchen acetaminophen (TYLENOL) 325 MG tablet Take 650 mg by mouth every 6 (six) hours as needed.    . traMADol (ULTRAM) 50 MG tablet Take by mouth.     No current facility-administered medications for this visit.   Review of Systems General:  no complaints Skin: no complaints Eyes: no complaints HEENT: no complaints Breasts: no complaints Pulmonary: no complaints Cardiac: no complaints Gastrointestinal: no complaints Genitourinary/Sexual: no complaints Ob/Gyn: no complaints Musculoskeletal: no complaints Hematology: no complaints Neurologic/Psych: no complaints   Objective:  Physical Examination:  BP (!) 160/73 (BP Location: Right Arm, Patient Position: Sitting)   Pulse 65   Temp 98.5 F (36.9 C) (Tympanic)   Resp 18   Wt 255 lb (115.7 kg)   BMI 49.80 kg/m    ECOG Performance Status: 2 - Symptomatic, <50% confined to bed  GENERAL: Patient is a well appearing female in no acute distress. In wheelchair.  HEENT:  Sclera clear.  Anicteric NODES:  Negative axillary, supraclavicular, inguinal lymph node survery LUNGS:  Clear to auscultation bilaterally.   HEART:  Regular rate and rhythm.  ABDOMEN:  Protruberent, nontender.  No hernias. No masses or ascites. Incision now well healed.  EXTREMITIES:  No peripheral edema. Atraumatic. No cyanosis SKIN:  Clear with no obvious rashes or skin changes.  NEURO:  Nonfocal. Well oriented.  Appropriate affect.  Pelvic: exam chaperoned by nurse;  Vulva: normal appearing vulva with no masses, tenderness or lesions; Vagina: residual granulation tissue at cuff, no obvious persistent cancer.     Assessment:  Olivia Rodriguez is a 70 y.o. female diagnosed with Stage II dedifferentiated uterine cancer s/p TAH, BSO 06/08/19 with large cancer and cervical stromal involvement.  CT scan 05/23/19 had concern for mediastinal/hilar adenopathy.  Extensive vaginal recurrence 12/20 treated with external radiation and scheduled for brachytherapy with excellent response. PET scan now shows multiple new lung metastases.    Superficial wound infection now healed.   She had C diff post  op and treated with antibiotics. She has another month of vancomycin treatment.  Some consideration for fecal transplant because it was her second episode.  MSS cancer.    Medical co-morbidities complicating care: morbid obesity, recurrent C diff.  Plan:   Problem List Items Addressed This Visit      Genitourinary   Endometrial cancer [facility]) - Primary     Discussed with Dr Rogue Bussing and he will see her later this week to consider carbo/taxol chemotherapy in view of new lung metastases. We discussed that this is not a curative treatment, and that goal would be to control disease.  She could wait to take treatment until she becomes symptomatic, since she is feeling fairly well now.  Will discuss with Dr Rogue Bussing when she sees him. Another option would be hormonal therapy with progestin or letrozole, but chance of response  is low. Also discussed immunotherapy, but she does not have elevated TMB or MSI.  PDL1 is over 10% but this is not an indication for immunotherapy in uterine cancer.  We will see her back in 3 months.    The patient's diagnosis, an outline of the further diagnostic and laboratory studies which will be required, the recommendation, and alternatives were discussed.  All questions were answered to the patient's satisfaction.  Verlon Au, NP  I personally interviewed and examined the patient. Agreed with the above/below plan of care. I have directly contributed to assessment and plan of care of this patient and educated and discussed with patient and family.  Mellody Drown, MD    [phone]

## 2019-09-08 NOTE — Progress Notes (Signed)
Pt in for follow up, reports daily radiation, states stopped bleeding over a month ago.  Denies any concerns.  States nurse called yesterday for pre assessment questions. Reports no changes.

## 2019-09-09 ENCOUNTER — Ambulatory Visit
Admission: RE | Admit: 2019-09-09 | Discharge: 2019-09-09 | Disposition: A | Discharge status: Home / Self Care | Payer: Medicare Other | Source: Ambulatory Visit | Attending: Radiation Oncology | Admitting: Radiation Oncology

## 2019-09-09 ENCOUNTER — Other Ambulatory Visit: Payer: Self-pay

## 2019-09-09 DIAGNOSIS — C541 Malignant neoplasm of endometrium: Secondary | ICD-10-CM | POA: Diagnosis not present

## 2019-09-10 ENCOUNTER — Ambulatory Visit: Payer: Medicare Other

## 2019-09-10 ENCOUNTER — Encounter: Payer: Self-pay | Admitting: Internal Medicine

## 2019-09-10 ENCOUNTER — Inpatient Hospital Stay: Payer: Medicare Other | Admitting: Internal Medicine

## 2019-09-10 ENCOUNTER — Other Ambulatory Visit: Payer: Self-pay

## 2019-09-10 ENCOUNTER — Ambulatory Visit
Admission: RE | Admit: 2019-09-10 | Discharge: 2019-09-10 | Disposition: A | Discharge status: Home / Self Care | Payer: Medicare Other | Source: Ambulatory Visit | Attending: Radiation Oncology | Admitting: Radiation Oncology

## 2019-09-10 ENCOUNTER — Inpatient Hospital Stay: Payer: Medicare Other

## 2019-09-10 VITALS — BP 188/74 | HR 63 | Temp 97.9°F | Resp 20 | Ht 60.0 in | Wt 257.8 lb

## 2019-09-10 DIAGNOSIS — C541 Malignant neoplasm of endometrium: Secondary | ICD-10-CM

## 2019-09-10 DIAGNOSIS — D500 Iron deficiency anemia secondary to blood loss (chronic): Secondary | ICD-10-CM | POA: Diagnosis not present

## 2019-09-10 LAB — COMPREHENSIVE METABOLIC PANEL
ALT: 14 U/L (ref 0–44)
AST: 19 U/L (ref 15–41)
Albumin: 3.8 g/dL (ref 3.5–5.0)
Alkaline Phosphatase: 93 U/L (ref 38–126)
Anion gap: 8 (ref 5–15)
BUN: 24 mg/dL — ABNORMAL HIGH (ref 8–23)
CO2: 26 mmol/L (ref 22–32)
Calcium: 9.2 mg/dL (ref 8.9–10.3)
Chloride: 102 mmol/L (ref 98–111)
Creatinine, Ser: 0.69 mg/dL (ref 0.44–1.00)
GFR calc Af Amer: >60 mL/min (ref >60)
GFR calc non Af Amer: >60 mL/min (ref >60)
Glucose, Bld: 102 mg/dL — ABNORMAL HIGH (ref 70–99)
Potassium: 3.8 mmol/L (ref 3.5–5.1)
Sodium: 136 mmol/L (ref 135–145)
Total Bilirubin: 0.4 mg/dL (ref 0.3–1.2)
Total Protein: 7.3 g/dL (ref 6.5–8.1)

## 2019-09-10 LAB — IRON AND TIBC
Iron: 39 ug/dL (ref 28–170)
Saturation Ratios: 7 % — ABNORMAL LOW (ref 10.4–31.8)
TIBC: 552 ug/dL — ABNORMAL HIGH (ref 250–450)
UIBC: 513 ug/dL

## 2019-09-10 LAB — CBC WITH DIFFERENTIAL/PLATELET
Abs Immature Granulocytes: 0.01 10*3/uL (ref 0.00–0.07)
Basophils Absolute: 0 10*3/uL (ref 0.0–0.1)
Basophils Relative: 1 %
Eosinophils Absolute: 0.2 10*3/uL (ref 0.0–0.5)
Eosinophils Relative: 4 %
HCT: 29.2 % — ABNORMAL LOW (ref 36.0–46.0)
Hemoglobin: 8.4 g/dL — ABNORMAL LOW (ref 12.0–15.0)
Immature Granulocytes: 0 %
Lymphocytes Relative: 11 %
Lymphs Abs: 0.4 10*3/uL — ABNORMAL LOW (ref 0.7–4.0)
MCH: 24.9 pg — ABNORMAL LOW (ref 26.0–34.0)
MCHC: 28.8 g/dL — ABNORMAL LOW (ref 30.0–36.0)
MCV: 86.4 fL (ref 80.0–100.0)
Monocytes Absolute: 0.4 10*3/uL (ref 0.1–1.0)
Monocytes Relative: 11 %
Neutro Abs: 3.1 10*3/uL (ref 1.7–7.7)
Neutrophils Relative %: 73 %
Platelets: 249 10*3/uL (ref 150–400)
RBC: 3.38 MIL/uL — ABNORMAL LOW (ref 3.87–5.11)
RDW: 17.4 % — ABNORMAL HIGH (ref 11.5–15.5)
WBC: 4.2 10*3/uL (ref 4.0–10.5)
nRBC: 0 % (ref 0.0–0.2)

## 2019-09-10 LAB — VITAMIN D 25 HYDROXY (VIT D DEFICIENCY, FRACTURES): Vit D, 25-Hydroxy: 23.79 ng/mL — ABNORMAL LOW (ref 30–100)

## 2019-09-10 LAB — C-REACTIVE PROTEIN: CRP: 0.7 mg/dL (ref <1.0)

## 2019-09-10 LAB — FERRITIN: Ferritin: 9 ng/mL — ABNORMAL LOW (ref 11–307)

## 2019-09-10 MED ORDER — LETROZOLE 2.5 MG PO TABS: 2.5000 mg | ORAL_TABLET | Freq: Every day | ORAL | 3 refills | Status: AC

## 2019-09-10 NOTE — Progress Notes (Signed)
[facility] CONSULT NOTE  Patient Care Team: Tracie Harrier, MD as PCP - General (Internal Medicine) Clent Jacks, RN as Oncology Nurse Navigator Noreene Filbert, MD as Referring Physician (Radiation Oncology)  CHIEF COMPLAINTS/PURPOSE OF CONSULTATION: Endometrial stromal cancer  #  Oncology History Overview Note  #  OCT 2020- high-grade endometrial stromal carcinoma.  s/p TAH-BSO and pelvic lymph node [[facility]; Dr.Berchuck] Tumor measured 9.7 cm, dedifferentiated carcinoma with almost complete involvement of the myometrium (93%- 65mm of 29 mm).  LVSI was present in cervical stroma.  Vaginal cuff margin was clear at 8 mm.  1 enlarged lymph node was negative for malignancy.   # DEC 2020-vaginal recurrence-EBRT; followed by [facility] x 3  [finish feb 1st,2021; Dr.Crystal]  # JAN 15th 2021- LETROZOLE   # NGS/MOLECULAR TESTS:P    # PALLIATIVE CARE EVALUATION: O -1/15.   # PAIN MANAGEMENT: NA   DIAGNOSIS: Endometrial sarcoma  STAGE:    4     ;  GOALS: Palliation  CURRENT/MOST RECENT THERAPY : Radiation/letrozole    Endometrial cancer ([place]) (Resolved)  05/21/2019 Initial Diagnosis   Endometrial cancer (HCC)   Endometrial stromal sarcoma ([place])  05/21/2019 Initial Diagnosis   Endometrial stromal sarcoma (HCC)      HISTORY OF PRESENTING ILLNESS:  Olivia Rodriguez 69 y.o.  female history of recent endometrial sarcoma is here for initial consultation.  In October 2020 patient had presented with longstanding history of postmenopausal bleeding.  Patient was taken to [facility]-given the severe anemia/bulky uterine mass.  Patient underwent TAH/BSO in October 2020-showed high-grade uterine stromal tumor/sarcoma.  Postoperatively-complicated by wound healing problems needing wound VAC/rehab placement.  Unfortunate December 2020-patient noted to have vaginal recurrence.  Patient currently undergoing external beam radiation; scheduled for brachytherapy [finishing September 27, 2019].   Unfortunately PET scan showed-mediastinal/adenopathy-bilateral subcentimeter lung nodules-consistent with metastatic disease.  Patient is currently home.  Gaining her strength back.  No more diarrhea.  No vaginal bleeding.  Review of Systems  Constitutional: Positive for malaise/fatigue and weight loss. Negative for chills, diaphoresis and fever.  HENT: Negative for nosebleeds and sore throat.   Eyes: Negative for double vision.  Respiratory: Negative for cough, hemoptysis, sputum production, shortness of breath and wheezing.   Cardiovascular: Negative for chest pain, palpitations, orthopnea and leg swelling.  Gastrointestinal: Negative for abdominal pain, blood in stool, constipation, diarrhea, heartburn, melena, nausea and vomiting.  Genitourinary: Negative for dysuria, frequency and urgency.  Musculoskeletal: Positive for back pain and joint pain.  Skin: Negative.  Negative for itching and rash.  Neurological: Negative for dizziness, tingling, focal weakness, weakness and headaches.  Endo/Heme/Allergies: Does not bruise/bleed easily.  Psychiatric/Behavioral: Negative for depression. The patient is not nervous/anxious and does not have insomnia.      MEDICAL HISTORY:  Past Medical History:  Diagnosis Date  . Arthritis   . Hypertension     SURGICAL HISTORY: Past Surgical History:  Procedure Laterality Date  . CESAREAN SECTION    . DILATION AND CURETTAGE OF UTERUS N/A 05/21/2019   Procedure: DILATATION AND CURETTAGE, FRACTIONAL;  Surgeon: Schermerhorn, Gwen Her, MD;  Location: [facility] ORS;  Service: Gynecology;  Laterality: N/A;  . JOINT REPLACEMENT    . REFRACTIVE SURGERY Bilateral 1999  . TONSILLECTOMY    . TOTAL HIP ARTHROPLASTY Left 10/06/2018   Procedure: TOTAL HIP ARTHROPLASTY ANTERIOR APPROACH-LEFT;  Surgeon: Hessie Knows, MD;  Location: [facility] ORS;  Service: Orthopedics;  Laterality: Left;    SOCIAL HISTORY: Social History   Socioeconomic History  . Marital status:  Single    Spouse name: Not on file  . Number of children: Not on file  . Years of education: Not on file  . Highest education level: Not on file  Occupational History  . Not on file  Tobacco Use  . Smoking status: Never Smoker  . Smokeless tobacco: Never Used  Substance and Sexual Activity  . Alcohol use: Not Currently    Comment: Occasional wine or mixed drink  . Drug use: Never  . Sexual activity: Not Currently  Other Topics Concern  . Not on file  Social History Narrative   Daughter works for [employer]; lives at home by herself in [place]; never smoked; rare alcohol. 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    Social Determinants of Health   Financial Resource Strain:   . Difficulty of Paying Living Expenses: Not on file  Food Insecurity:   . Worried About Charity fundraiser in the Last Year: Not on file  . Ran Out of Food in the Last Year: Not on file  Transportation Needs:   . Lack of Transportation (Medical): Not on file  . Lack of Transportation (Non-Medical): Not on file  Physical Activity:   . Days of Exercise per Week: Not on file  . Minutes of Exercise per Session: Not on file  Stress:   . Feeling of Stress : Not on file  Social Connections:   . Frequency of Communication with Friends and Family: Not on file  . Frequency of Social Gatherings with Friends and Family: Not on file  . Attends Religious Services: Not on file  . Active Member of Clubs or Organizations: Not on file  . Attends Archivist Meetings: Not on file  . Marital Status: Not on file  Intimate Partner Violence:   . Fear of Current or Ex-Partner: Not on file  . Emotionally Abused: Not on file  . Physically Abused: Not on file  . Sexually Abused: Not on file    FAMILY HISTORY: No family history on file.  ALLERGIES:  is allergic to azithromycin.  MEDICATIONS:  Current Outpatient Medications  Medication Sig Dispense Refill  . acetaminophen (TYLENOL) 325 MG tablet Take 650 mg by  mouth every 6 (six) hours as needed.    . hydrochlorothiazide (HYDRODIURIL) 25 MG tablet Take 1 tablet (25 mg total) by mouth daily. 30 tablet 0  . ibuprofen (ADVIL) 200 MG tablet Take 200 mg by mouth every 6 (six) hours as needed.    . Potassium (GNP POTASSIUM) 99 MG TABS Take by mouth.    . saccharomyces boulardii (FLORASTOR) 250 MG capsule Take by mouth.    . traMADol (ULTRAM) 50 MG tablet Take by mouth.    . letrozole (FEMARA) 2.5 MG tablet Take 1 tablet (2.5 mg total) by mouth daily. Once a day. 30 tablet 3   No current facility-administered medications for this visit.      Marland Kitchen  PHYSICAL EXAMINATION: ECOG PERFORMANCE STATUS: 2 - Symptomatic, <50% confined to bed  Vitals:   09/10/19 0900  BP: (!) 188/74  Pulse: 63  Resp: 20  Temp: 97.9 F (36.6 C)   Filed Weights   09/10/19 0859  Weight: 257 lb 12.8 oz (116.9 kg)    Physical Exam  Constitutional: She is oriented to person, place, and time and well-developed, well-nourished, and in no distress.  HENT:  Head: Normocephalic and atraumatic.  Mouth/Throat: Oropharynx is clear and moist. No oropharyngeal exudate.  Eyes: Pupils are equal, round, and reactive to light.  Cardiovascular:  Normal rate and regular rhythm.  Pulmonary/Chest: No respiratory distress. She has no wheezes.  Abdominal: Soft. Bowel sounds are normal. She exhibits no distension and no mass. There is no abdominal tenderness. There is no rebound and no guarding.  Musculoskeletal:        General: No tenderness or edema. Normal range of motion.     Cervical back: Normal range of motion and neck supple.  Neurological: She is alert and oriented to person, place, and time.  Skin: Skin is warm.  Psychiatric: Affect normal.     LABORATORY DATA:  I have reviewed the data as listed Lab Results  Component Value Date   WBC 4.2 09/10/2019   HGB 8.4 (L) 09/10/2019   HCT 29.2 (L) 09/10/2019   MCV 86.4 09/10/2019   PLT 249 09/10/2019   Recent Labs     10/27/18 0915 10/28/18 0428 11/05/18 2020 11/07/18 0504 11/09/18 0508 11/09/18 0508 05/18/19 1405 05/21/19 1057 09/10/19 1004  NA 133*   < > 135   < > 138   < > 140 139 136  K 3.8   < > 3.2*   < > 4.7   < > 3.3* 3.4* 3.8  CL 102   < > 97*   < > 103   < > 106 102 102  CO2 20*   < > 30   < > 27  --  26  --  26  GLUCOSE 151*   < > 132*   < > 101*   < > 95 84 102*  BUN 65*   < > 14   < > 18   < > 27* 20 24*  CREATININE 1.76*   < > 0.83   < > 0.49   < > 0.84 0.80 0.69  CALCIUM 7.9*   < > 8.0*   < > 8.1*  --  9.3  --  9.2  GFRNONAA 29*   < > >60   < > >60  --  >60  --  >60  GFRAA 34*   < > >60   < > >60  --  >60  --  >60  PROT 5.2*  --  5.0*  --   --   --   --   --  7.3  ALBUMIN 2.0*  --  2.4*  --   --   --   --   --  3.8  AST 17  --  29  --   --   --   --   --  19  ALT 24  --  16  --   --   --   --   --  14  ALKPHOS 124  --  90  --   --   --   --   --  93  BILITOT 0.4  --  0.5  --   --   --   --   --  0.4  BILIDIR  --   --  0.1  --   --   --   --   --   --   IBILI  --   --  0.4  --   --   --   --   --   --    < > = values in this interval not displayed.    RADIOGRAPHIC STUDIES: I have personally reviewed the radiological images as listed and agreed with the findings in the report. NM PET Image Initial (PI) Skull  Base To Thigh  Result Date: 08/18/2019 CLINICAL DATA:  Initial treatment strategy for endometrial carcinoma. EXAM: NUCLEAR MEDICINE PET SKULL BASE TO THIGH TECHNIQUE: 13.5 mCi F-18 FDG was injected intravenously. Full-ring PET imaging was performed from the skull base to thigh after the radiotracer. CT data was obtained and used for attenuation correction and anatomic localization. Fasting blood glucose: 84 mg/dl COMPARISON:  None. FINDINGS: Mediastinal blood pool activity: SUV max 2.8 Liver activity: SUV max NA NECK: No hypermetabolic lymph nodes in the neck. Incidental CT findings: none CHEST: There are bilateral multiple pulmonary nodules of varying sizes. Majority of the  nodules do not have metabolic activity although there are small ( below the the size threshold for accurate PET characterization). At least 1 of the of the larger nodules does have mild activity - 8 mm nodule in the lingula with SUV max equal 2.8. Example non metabolic nodule in the RIGHT upper lobe measures 5 mm (image 89/4). Approximately 20 nodules per lung. No hypermetabolic mediastinal lymph nodes. Incidental CT findings: none ABDOMEN/PELVIS: No abnormal hypermetabolic activity within the liver, pancreas, adrenal glands, or spleen. No hypermetabolic lymph nodes in the abdomen or pelvis. Incidental CT findings: Post hysterectomy.  Pelvic nodularity. Metabolic activity along the ventral midline abdominal wall is favored benign inflammation from abdominal surgery SKELETON: No focal hypermetabolic activity to suggest skeletal metastasis. Incidental CT findings: none IMPRESSION: 1. No evidence of residual endometrial carcinoma in the pelvis. No evidence of metastatic disease in the abdomen or pelvis. 2. Unfortunately, there multiple bilateral pulmonary nodules of varying sizes. Some of the larger nodules do have faint metabolic activity. Findings are most consistent with METASTATIC ENDOMETRIAL CARCINOMA TO THE LUNGS. Electronically Signed   By: Suzy Bouchard M.D.   On: 08/18/2019 16:02   CT OUTSIDE FILMS CHEST  Result Date: 08/19/2019 This examination belongs to an outside facility and is stored here for comparison purposes only.  Contact the originating outside institution for any associated report or interpretation.   ASSESSMENT & PLAN:   Endometrial cancer ([place])    Endometrial stromal sarcoma ([place]) # ENDOMETRIAL STROMA SARCOMA- high grade-s/p TAH & BSO; with vaginal recurrence on EBRT; awaiting brachy [will finish Feb 1st 2021].  #Reviewed the pathology and stage with the patient in detail.  Discussed that unfortunately this is incurable disease given the lung lesions.  Given patient's  multiple comorbidities/post surgical complications-I think it is reasonable to try a nonchemotherapy option at this time.  Discussed regarding treatment with aromatase inhibitor-letrozole.  Discussed the potential side effects including but not limited to hot flashes arthritic pain etc.  Prescription given.  #History of C. difficile colitis [x3 s/p vancomycin]-monitor closely.  # HTN- poorly controlled. Keep log of blood pressures; bring it next visit.  # Anemia-Jan 2021 hemoglobin 7.9.  Recommend iron studies labs today; recommend oral iron.  # Palliative care evaluation: Introduced palliative care philosophy and services. I discussed the need for palliative care evaluation/symptom management to help quality of life in the context of incurable disease.  Patient is interested; will make referral.  # I offered to call the patient's family to give an update on clinical status; patient declines.  Discussed with Dr. Fransisca Connors; and Mathis Fare  # DISPOSITION: # labs today- orders in # [facility]- palliative care in 70month # follow up in 1 month;- MD; cbc/bmp- Dr.B  All questions were answered. The patient knows to call the clinic with any problems, questions or concerns.    Cammie Sickle, MD 09/15/2019 12:44 PM

## 2019-09-10 NOTE — Assessment & Plan Note (Addendum)
#   ENDOMETRIAL STROMA SARCOMA- high grade-s/p TAH & BSO; with vaginal recurrence on EBRT; awaiting Deon Pilling finish Feb 1st 2021].  #Reviewed the pathology and stage with the patient in detail.  Discussed that unfortunately this is incurable disease given the lung lesions.  Given patient's multiple comorbidities/post surgical complications-I think it is reasonable to try a nonchemotherapy option at this time.  Discussed regarding treatment with aromatase inhibitor-letrozole.  Discussed the potential side effects including but not limited to hot flashes arthritic pain etc.  Prescription given.  #History of C. difficile colitis [x3 s/p vancomycin]-monitor closely.  # HTN- poorly controlled. Keep log of blood pressures; bring it next visit.  # Anemia-Jan 2021 hemoglobin 7.9.  Recommend iron studies labs today; recommend oral iron.  # Palliative care evaluation: Introduced palliative care philosophy and services. I discussed the need for palliative care evaluation/symptom management to help quality of life in the context of incurable disease.  Patient is interested; will make referral.  # I offered to call the patient's family to give an update on clinical status; patient declines.  Discussed with Dr. Fransisca Connors; and Mathis Fare  # DISPOSITION: # labs today- orders in # [facility]- palliative care in 9month # follow up in 1 month;- MD; cbc/bmp- Dr.B

## 2019-09-11 ENCOUNTER — Ambulatory Visit: Payer: Medicare Other

## 2019-09-11 LAB — CA 125: Cancer Antigen (CA) 125: 7.4 U/mL (ref 0.0–38.1)

## 2019-09-13 ENCOUNTER — Ambulatory Visit: Payer: Medicare Other

## 2019-09-13 ENCOUNTER — Ambulatory Visit
Admission: RE | Admit: 2019-09-13 | Discharge: 2019-09-13 | Disposition: A | Discharge status: Home / Self Care | Payer: Medicare Other | Source: Ambulatory Visit | Attending: Radiation Oncology | Admitting: Radiation Oncology

## 2019-09-13 ENCOUNTER — Other Ambulatory Visit: Payer: Self-pay

## 2019-09-13 DIAGNOSIS — C541 Malignant neoplasm of endometrium: Secondary | ICD-10-CM | POA: Diagnosis not present

## 2019-09-15 DIAGNOSIS — D500 Iron deficiency anemia secondary to blood loss (chronic): Secondary | ICD-10-CM | POA: Insufficient documentation

## 2019-09-16 ENCOUNTER — Telehealth: Payer: Self-pay | Admitting: Internal Medicine

## 2019-09-16 ENCOUNTER — Ambulatory Visit: Payer: Medicare Other

## 2019-09-16 DIAGNOSIS — C541 Malignant neoplasm of endometrium: Secondary | ICD-10-CM | POA: Diagnosis not present

## 2019-09-16 NOTE — Telephone Encounter (Signed)
On 1/20-spoke to patient regarding results of anemia/iron deficiency.  Recommend p.o. iron  If not improved would recommend IV iron/Venofer at next visit.npt in agreement.   C- Please schedule for venofer at next visit.

## 2019-09-17 ENCOUNTER — Encounter: Payer: Self-pay | Admitting: Internal Medicine

## 2019-09-20 ENCOUNTER — Other Ambulatory Visit: Payer: Self-pay

## 2019-09-20 ENCOUNTER — Ambulatory Visit
Admission: RE | Admit: 2019-09-20 | Discharge: 2019-09-20 | Disposition: A | Discharge status: Home / Self Care | Payer: Medicare Other | Source: Ambulatory Visit | Attending: Radiation Oncology | Admitting: Radiation Oncology

## 2019-09-20 DIAGNOSIS — C541 Malignant neoplasm of endometrium: Secondary | ICD-10-CM | POA: Diagnosis not present

## 2019-09-23 ENCOUNTER — Ambulatory Visit
Admission: RE | Admit: 2019-09-23 | Discharge: 2019-09-23 | Disposition: A | Discharge status: Home / Self Care | Payer: Medicare Other | Source: Ambulatory Visit | Attending: Radiation Oncology | Admitting: Radiation Oncology

## 2019-09-23 ENCOUNTER — Other Ambulatory Visit: Payer: Self-pay

## 2019-09-23 DIAGNOSIS — C541 Malignant neoplasm of endometrium: Secondary | ICD-10-CM | POA: Diagnosis not present

## 2019-09-27 ENCOUNTER — Ambulatory Visit
Admission: RE | Admit: 2019-09-27 | Discharge: 2019-09-27 | Disposition: A | Discharge status: Home / Self Care | Payer: Medicare Other | Source: Ambulatory Visit | Attending: Radiation Oncology | Admitting: Radiation Oncology

## 2019-09-27 ENCOUNTER — Other Ambulatory Visit: Payer: Self-pay

## 2019-09-27 DIAGNOSIS — C541 Malignant neoplasm of endometrium: Secondary | ICD-10-CM | POA: Diagnosis present

## 2019-09-27 DIAGNOSIS — Z510 Encounter for antineoplastic radiation therapy: Secondary | ICD-10-CM | POA: Insufficient documentation

## 2019-10-08 ENCOUNTER — Inpatient Hospital Stay: Payer: Medicare Other | Admitting: Hospice and Palliative Medicine

## 2019-10-08 ENCOUNTER — Inpatient Hospital Stay: Payer: Medicare Other

## 2019-10-08 ENCOUNTER — Inpatient Hospital Stay: Payer: Medicare Other | Admitting: Internal Medicine

## 2019-10-18 ENCOUNTER — Inpatient Hospital Stay: Payer: Medicare Other

## 2019-10-18 ENCOUNTER — Inpatient Hospital Stay: Payer: Medicare Other | Admitting: Internal Medicine

## 2019-10-29 ENCOUNTER — Ambulatory Visit: Payer: Medicare Other | Admitting: Radiation Oncology

## 2019-11-01 ENCOUNTER — Other Ambulatory Visit: Payer: Self-pay

## 2019-11-02 ENCOUNTER — Telehealth: Payer: Self-pay | Admitting: Internal Medicine

## 2019-11-02 ENCOUNTER — Inpatient Hospital Stay (HOSPITAL_BASED_OUTPATIENT_CLINIC_OR_DEPARTMENT_OTHER): Payer: Medicare Other | Admitting: Internal Medicine

## 2019-11-02 ENCOUNTER — Inpatient Hospital Stay: Payer: Medicare Other

## 2019-11-02 ENCOUNTER — Other Ambulatory Visit: Payer: Self-pay | Admitting: *Deleted

## 2019-11-02 ENCOUNTER — Other Ambulatory Visit: Payer: Self-pay

## 2019-11-02 ENCOUNTER — Ambulatory Visit
Admission: RE | Admit: 2019-11-02 | Discharge: 2019-11-02 | Disposition: A | Discharge status: Home / Self Care | Payer: Medicare Other | Source: Ambulatory Visit | Attending: Internal Medicine | Admitting: Internal Medicine

## 2019-11-02 ENCOUNTER — Inpatient Hospital Stay: Payer: Medicare Other | Attending: Internal Medicine

## 2019-11-02 VITALS — BP 195/75 | HR 83 | Temp 97.3°F | Wt 259.0 lb

## 2019-11-02 DIAGNOSIS — C7982 Secondary malignant neoplasm of genital organs: Secondary | ICD-10-CM | POA: Diagnosis not present

## 2019-11-02 DIAGNOSIS — C541 Malignant neoplasm of endometrium: Secondary | ICD-10-CM

## 2019-11-02 DIAGNOSIS — I10 Essential (primary) hypertension: Secondary | ICD-10-CM | POA: Insufficient documentation

## 2019-11-02 DIAGNOSIS — R059 Cough, unspecified: Secondary | ICD-10-CM

## 2019-11-02 DIAGNOSIS — Z923 Personal history of irradiation: Secondary | ICD-10-CM | POA: Diagnosis not present

## 2019-11-02 DIAGNOSIS — Z90710 Acquired absence of both cervix and uterus: Secondary | ICD-10-CM | POA: Insufficient documentation

## 2019-11-02 DIAGNOSIS — R05 Cough: Secondary | ICD-10-CM

## 2019-11-02 DIAGNOSIS — Z90722 Acquired absence of ovaries, bilateral: Secondary | ICD-10-CM | POA: Diagnosis not present

## 2019-11-02 DIAGNOSIS — D500 Iron deficiency anemia secondary to blood loss (chronic): Secondary | ICD-10-CM | POA: Insufficient documentation

## 2019-11-02 DIAGNOSIS — M129 Arthropathy, unspecified: Secondary | ICD-10-CM | POA: Diagnosis not present

## 2019-11-02 DIAGNOSIS — R918 Other nonspecific abnormal finding of lung field: Secondary | ICD-10-CM | POA: Diagnosis not present

## 2019-11-02 DIAGNOSIS — R0602 Shortness of breath: Secondary | ICD-10-CM | POA: Diagnosis not present

## 2019-11-02 DIAGNOSIS — Z79811 Long term (current) use of aromatase inhibitors: Secondary | ICD-10-CM | POA: Diagnosis not present

## 2019-11-02 LAB — CBC WITH DIFFERENTIAL/PLATELET
Abs Immature Granulocytes: 0.06 10*3/uL (ref 0.00–0.07)
Basophils Absolute: 0.1 10*3/uL (ref 0.0–0.1)
Basophils Relative: 1 %
Eosinophils Absolute: 0.3 10*3/uL (ref 0.0–0.5)
Eosinophils Relative: 4 %
HCT: 37.4 % (ref 36.0–46.0)
Hemoglobin: 11.3 g/dL — ABNORMAL LOW (ref 12.0–15.0)
Immature Granulocytes: 1 %
Lymphocytes Relative: 6 %
Lymphs Abs: 0.5 10*3/uL — ABNORMAL LOW (ref 0.7–4.0)
MCH: 26.7 pg (ref 26.0–34.0)
MCHC: 30.2 g/dL (ref 30.0–36.0)
MCV: 88.4 fL (ref 80.0–100.0)
Monocytes Absolute: 0.8 10*3/uL (ref 0.1–1.0)
Monocytes Relative: 11 %
Neutro Abs: 6 10*3/uL (ref 1.7–7.7)
Neutrophils Relative %: 77 %
Platelets: 443 10*3/uL — ABNORMAL HIGH (ref 150–400)
RBC: 4.23 MIL/uL (ref 3.87–5.11)
RDW: 18.6 % — ABNORMAL HIGH (ref 11.5–15.5)
WBC: 7.7 10*3/uL (ref 4.0–10.5)
nRBC: 0 % (ref 0.0–0.2)

## 2019-11-02 LAB — BASIC METABOLIC PANEL
Anion gap: 13 (ref 5–15)
BUN: 25 mg/dL — ABNORMAL HIGH (ref 8–23)
CO2: 27 mmol/L (ref 22–32)
Calcium: 9.6 mg/dL (ref 8.9–10.3)
Chloride: 98 mmol/L (ref 98–111)
Creatinine, Ser: 0.56 mg/dL (ref 0.44–1.00)
GFR calc Af Amer: >60 mL/min (ref >60)
GFR calc non Af Amer: >60 mL/min (ref >60)
Glucose, Bld: 116 mg/dL — ABNORMAL HIGH (ref 70–99)
Potassium: 3.4 mmol/L — ABNORMAL LOW (ref 3.5–5.1)
Sodium: 138 mmol/L (ref 135–145)

## 2019-11-02 LAB — BRAIN NATRIURETIC PEPTIDE: B Natriuretic Peptide: 57 pg/mL (ref 0.0–100.0)

## 2019-11-02 NOTE — Telephone Encounter (Signed)
I tried to reach the patient's daughter today with results of the x-ray; unable to reach/unable to leave voicemail  Spoke to patient regarding concerning results of recurrent malignancy based on chest x-ray.  Will discuss with gynecology oncology on 3/10.  Discussed that patient will likely need chemotherapy.  We will again states that if patient regarding appointment to discuss chemotherapy options.

## 2019-11-02 NOTE — Assessment & Plan Note (Addendum)
#   ENDOMETRIAL STROMA SARCOMA- high grade-s/p TAH & BSO; with vaginal recurrence on EBRT; s/p Deon Pilling finish Feb 1st 2021].  # currently on letrozole [since Jan 15th 2021]; tolerating well-question progression given the worsening cough.  Get chest x-ray stat.  # HTN- poorly controlled. 190s in office [ at home 130; reminded to bring og of blood pressures.   # Anemia-Jan 2021 hemoglobin 7.9; on PO iron Hb 11.3. improved.  Hold off Venofer.   # DISPOSITION: ADD BNP TODAY LABS # CXR today # NO venofer today # follow up in 1 month;- MD; cbc/bmp- Dr.B

## 2019-11-02 NOTE — Progress Notes (Signed)
[facility] CONSULT NOTE  Patient Care Team: Tracie Harrier, MD as PCP - General (Internal Medicine) Clent Jacks, RN as Oncology Nurse Navigator Noreene Filbert, MD as Referring Physician (Radiation Oncology)  CHIEF COMPLAINTS/PURPOSE OF CONSULTATION: Endometrial stromal cancer  #  Oncology History Overview Note  #  OCT 2020- high-grade endometrial stromal carcinoma.  s/p TAH-BSO and pelvic lymph node [[facility]; Dr.Berchuck] Tumor measured 9.7 cm, dedifferentiated carcinoma with almost complete involvement of the myometrium (93%- 23mm of 29 mm).  LVSI was present in cervical stroma.  Vaginal cuff margin was clear at 8 mm.  1 enlarged lymph node was negative for malignancy.   # DEC 2020-vaginal recurrence-EBRT; followed by [facility] x 3  [finish feb 1st,2021; Dr.Crystal]  # JAN 15th 2021- LETROZOLE   # NGS/MOLECULAR TESTS:P    # PALLIATIVE CARE EVALUATION: O -1/15.   # PAIN MANAGEMENT: NA   DIAGNOSIS: Endometrial sarcoma  STAGE:    4     ;  GOALS: Palliation  CURRENT/MOST RECENT THERAPY : Radiation/letrozole    Endometrial cancer ([place]) (Resolved)  05/21/2019 Initial Diagnosis   Endometrial cancer (HCC)   Endometrial stromal sarcoma ([place])  05/21/2019 Initial Diagnosis   Endometrial stromal sarcoma (HCC)      HISTORY OF PRESENTING ILLNESS:  Olivia Rodriguez 69 y.o.  female recent history of endometrial sarcoma with local recurrence status post radiation is here for follow-up.  Patient is currently on Femara.  Patient denies any more vaginal bleeding.  Her appetite is good.  However,  she notes to have worsening shortness of breath especially nighttime when laying on her back.  Chronic swelling the legs.  Not any worse.  Positive for 5 pound weight gain.  Chronic cough dry no hemoptysis.  No sputum.  No fevers or chills.  She has longstanding history of allergies.  She has not been taking any antihistamine.   Review of Systems  Constitutional: Positive for  malaise/fatigue and weight loss. Negative for chills, diaphoresis and fever.  HENT: Negative for nosebleeds and sore throat.   Eyes: Negative for double vision.  Respiratory: Positive for cough and shortness of breath. Negative for hemoptysis, sputum production and wheezing.   Cardiovascular: Positive for leg swelling. Negative for chest pain, palpitations and orthopnea.  Gastrointestinal: Negative for abdominal pain, blood in stool, constipation, diarrhea, heartburn, melena, nausea and vomiting.  Genitourinary: Negative for dysuria, frequency and urgency.  Musculoskeletal: Positive for back pain and joint pain.  Skin: Negative.  Negative for itching and rash.  Neurological: Negative for dizziness, tingling, focal weakness, weakness and headaches.  Endo/Heme/Allergies: Does not bruise/bleed easily.  Psychiatric/Behavioral: Negative for depression. The patient is not nervous/anxious and does not have insomnia.      MEDICAL HISTORY:  Past Medical History:  Diagnosis Date  . Arthritis   . Hypertension     SURGICAL HISTORY: Past Surgical History:  Procedure Laterality Date  . CESAREAN SECTION    . DILATION AND CURETTAGE OF UTERUS N/A 05/21/2019   Procedure: DILATATION AND CURETTAGE, FRACTIONAL;  Surgeon: Schermerhorn, Gwen Her, MD;  Location: [facility] ORS;  Service: Gynecology;  Laterality: N/A;  . JOINT REPLACEMENT    . REFRACTIVE SURGERY Bilateral 1999  . TONSILLECTOMY    . TOTAL HIP ARTHROPLASTY Left 10/06/2018   Procedure: TOTAL HIP ARTHROPLASTY ANTERIOR APPROACH-LEFT;  Surgeon: Hessie Knows, MD;  Location: [facility] ORS;  Service: Orthopedics;  Laterality: Left;    SOCIAL HISTORY: Social History   Socioeconomic History  . Marital status: Single    Spouse  name: Not on file  . Number of children: Not on file  . Years of education: Not on file  . Highest education level: Not on file  Occupational History  . Not on file  Tobacco Use  . Smoking status: Never Smoker  . Smokeless  tobacco: Never Used  Substance and Sexual Activity  . Alcohol use: Not Currently    Comment: Occasional wine or mixed drink  . Drug use: Never  . Sexual activity: Not Currently  Other Topics Concern  . Not on file  Social History Narrative   Daughter works for [employer]; lives at home by herself in [place]; never smoked; rare alcohol. 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    Social Determinants of Health   Financial Resource Strain:   . Difficulty of Paying Living Expenses: Not on file  Food Insecurity:   . Worried About Charity fundraiser in the Last Year: Not on file  . Ran Out of Food in the Last Year: Not on file  Transportation Needs:   . Lack of Transportation (Medical): Not on file  . Lack of Transportation (Non-Medical): Not on file  Physical Activity:   . Days of Exercise per Week: Not on file  . Minutes of Exercise per Session: Not on file  Stress:   . Feeling of Stress : Not on file  Social Connections:   . Frequency of Communication with Friends and Family: Not on file  . Frequency of Social Gatherings with Friends and Family: Not on file  . Attends Religious Services: Not on file  . Active Member of Clubs or Organizations: Not on file  . Attends Archivist Meetings: Not on file  . Marital Status: Not on file  Intimate Partner Violence:   . Fear of Current or Ex-Partner: Not on file  . Emotionally Abused: Not on file  . Physically Abused: Not on file  . Sexually Abused: Not on file    FAMILY HISTORY: No family history on file.  ALLERGIES:  is allergic to azithromycin.  MEDICATIONS:  Current Outpatient Medications  Medication Sig Dispense Refill  . acetaminophen (TYLENOL) 325 MG tablet Take 650 mg by mouth every 6 (six) hours as needed.    . Cholecalciferol (VITAMIN D3 PO) Take 1 tablet by mouth daily.    . ferrous sulfate 325 (65 FE) MG EC tablet Take 325 mg by mouth daily with breakfast.    . hydrochlorothiazide (HYDRODIURIL) 25 MG tablet  Take 1 tablet (25 mg total) by mouth daily. 30 tablet 0  . ibuprofen (ADVIL) 200 MG tablet Take 200 mg by mouth every 6 (six) hours as needed.    Marland Kitchen letrozole (FEMARA) 2.5 MG tablet Take 1 tablet (2.5 mg total) by mouth daily. Once a day. 30 tablet 3  . Potassium (GNP POTASSIUM) 99 MG TABS Take by mouth.    . saccharomyces boulardii (FLORASTOR) 250 MG capsule Take 250 mg by mouth daily.     . traMADol (ULTRAM) 50 MG tablet Take by mouth.     No current facility-administered medications for this visit.      Marland Kitchen  PHYSICAL EXAMINATION: ECOG PERFORMANCE STATUS: 2 - Symptomatic, <50% confined to bed  Vitals:   11/02/19 1354  BP: (!) 195/75  Pulse: 83  Temp: (!) 97.3 F (36.3 C)  SpO2: 91%   Filed Weights   11/02/19 1354  Weight: 259 lb (117.5 kg)    Physical Exam  Constitutional: She is oriented to person, place, and time and well-developed, well-nourished,  and in no distress.  HENT:  Head: Normocephalic and atraumatic.  Mouth/Throat: Oropharynx is clear and moist. No oropharyngeal exudate.  Eyes: Pupils are equal, round, and reactive to light.  Cardiovascular: Normal rate and regular rhythm.  Pulmonary/Chest: No respiratory distress. She has no wheezes.  Clear to auscultation bilaterally.  No wheeze or crackles  Abdominal: Soft. Bowel sounds are normal. She exhibits no distension and no mass. There is no abdominal tenderness. There is no rebound and no guarding.  Musculoskeletal:        General: Edema present. No tenderness. Normal range of motion.     Cervical back: Normal range of motion and neck supple.  Neurological: She is alert and oriented to person, place, and time.  Skin: Skin is warm.  Psychiatric: Affect normal.     LABORATORY DATA:  I have reviewed the data as listed Lab Results  Component Value Date   WBC 7.7 11/02/2019   HGB 11.3 (L) 11/02/2019   HCT 37.4 11/02/2019   MCV 88.4 11/02/2019   PLT 443 (H) 11/02/2019   Recent Labs    11/05/18 2020  11/07/18 0504 05/18/19 1405 05/18/19 1405 05/21/19 1057 09/10/19 1004 11/02/19 1304  NA 135   < > 140   < > 139 136 138  K 3.2*   < > 3.3*   < > 3.4* 3.8 3.4*  CL 97*   < > 106   < > 102 102 98  CO2 30   < > 26  --   --  26 27  GLUCOSE 132*   < > 95   < > 84 102* 116*  BUN 14   < > 27*   < > 20 24* 25*  CREATININE 0.83   < > 0.84   < > 0.80 0.69 0.56  CALCIUM 8.0*   < > 9.3  --   --  9.2 9.6  GFRNONAA >60   < > >60  --   --  >60 >60  GFRAA >60   < > >60  --   --  >60 >60  PROT 5.0*  --   --   --   --  7.3  --   ALBUMIN 2.4*  --   --   --   --  3.8  --   AST 29  --   --   --   --  19  --   ALT 16  --   --   --   --  14  --   ALKPHOS 90  --   --   --   --  93  --   BILITOT 0.5  --   --   --   --  0.4  --   BILIDIR 0.1  --   --   --   --   --   --   IBILI 0.4  --   --   --   --   --   --    < > = values in this interval not displayed.    RADIOGRAPHIC STUDIES: I have personally reviewed the radiological images as listed and agreed with the findings in the report. DG Chest 2 View  Result Date: 11/02/2019 CLINICAL DATA:  History of sarcoma EXAM: CHEST - 2 VIEW COMPARISON:  PET-CT from 08/18/2019 FINDINGS: Cardiac shadow is mildly enlarged. Aortic calcifications are seen. There are innumerable nodules identified throughout both lungs significantly increased when compared with the prior exam. The largest of  these lies in the lateral aspect of the left lung measuring approximately 4.4 cm. No acute bony abnormality is seen. IMPRESSION: Significant increase in size and number of pulmonary nodules consistent with progressive metastatic disease. Electronically Signed   By: Inez Catalina M.D.   On: 11/02/2019 15:32    ASSESSMENT & PLAN:   Endometrial stromal sarcoma ([place]) # ENDOMETRIAL STROMA SARCOMA- high grade-s/p TAH & BSO; with vaginal recurrence on EBRT; s/p Deon Pilling finish Feb 1st 2021].  # currently on letrozole [since Jan 15th 2021]; tolerating well-question progression given the  worsening cough.  Get chest x-ray stat.  # HTN- poorly controlled. 190s in office [ at home 130; reminded to bring og of blood pressures.   # Anemia-Jan 2021 hemoglobin 7.9; on PO iron Hb 11.3. improved.  Hold off Venofer.   # DISPOSITION: ADD BNP TODAY LABS # CXR today # NO venofer today # follow up in 1 month;- MD; cbc/bmp- Dr.B  All questions were answered. The patient knows to call the clinic with any problems, questions or concerns.    Cammie Sickle, MD 11/02/2019 4:23 PM

## 2019-11-03 ENCOUNTER — Telehealth: Payer: Self-pay

## 2019-11-03 ENCOUNTER — Telehealth: Payer: Self-pay | Admitting: Internal Medicine

## 2019-11-03 NOTE — Telephone Encounter (Signed)
Spoke to patient regarding my discussion with Dr. Reed Breech the context of worsening chest x-ray concerning for malignancy.  Recommend follow-up 3-11/at 11:00.  And has the patient to bring family with her for the office visit.  Patient verbalized time and date of appointment.  C-please schedule appointment for above.

## 2019-11-03 NOTE — Telephone Encounter (Signed)
[facility] CDx/PD-L1 requisition submitted for specimen [phone], collected 07/28/2019. Copy of requisition sent to pathology.

## 2019-11-04 ENCOUNTER — Other Ambulatory Visit: Payer: Self-pay

## 2019-11-04 ENCOUNTER — Inpatient Hospital Stay (HOSPITAL_BASED_OUTPATIENT_CLINIC_OR_DEPARTMENT_OTHER): Payer: Medicare Other | Admitting: Internal Medicine

## 2019-11-04 ENCOUNTER — Inpatient Hospital Stay: Payer: Medicare Other

## 2019-11-04 VITALS — BP 206/80 | HR 90 | Temp 98.3°F | Resp 26

## 2019-11-04 DIAGNOSIS — C538 Malignant neoplasm of overlapping sites of cervix uteri: Secondary | ICD-10-CM | POA: Diagnosis not present

## 2019-11-04 DIAGNOSIS — C55 Malignant neoplasm of uterus, part unspecified: Secondary | ICD-10-CM | POA: Insufficient documentation

## 2019-11-04 DIAGNOSIS — C541 Malignant neoplasm of endometrium: Secondary | ICD-10-CM | POA: Diagnosis not present

## 2019-11-04 DIAGNOSIS — Z7189 Other specified counseling: Secondary | ICD-10-CM | POA: Diagnosis not present

## 2019-11-04 DIAGNOSIS — R0602 Shortness of breath: Secondary | ICD-10-CM | POA: Diagnosis not present

## 2019-11-04 NOTE — Progress Notes (Signed)
Patient presented to the clinic in dyspneic state. sats 78% RA sitting in wheelchair.  Md order to place patient on 2L of oxygen therapy. sats improved to 91%.   V/o entered for patient to have home oxygen therapy. DME-orders entered for oxygen. 2 Liters.   Tamika contacted Brad at [facility]. He will arrive in approximately 1 hour with a tank for patient to go home. Patient made aware of the plan of care.  Also RN spoke with Magdalene Patricia, RN who agreed to do the chemotherapy teaching today as add on. Will plan for carbo/taxol tomorrow morning.

## 2019-11-04 NOTE — Assessment & Plan Note (Addendum)
#   ENDOMETRIAL dedifferentiated carcinoma high grade-s/p TAH & BSO; with vaginal recurrence on EBRT; s/p Deon Pilling finish Feb 1st 2021].  Unfortunately chest x-ray suggestive of significant progression/recurrence of her endometrial malignancy.  Patient is quite symptomatic-ongoing desaturation/worsening cough.  Patient will need CT chest and pelvis when acute issues improve; this could be ordered at next visit.  Discussed with Dr. Fransisca Connors.  #Recommend discontinuation of letrozole.  Recommend carbotaxol chemotherapy every 3 weeks. Discussed the potential side effects including but not limited to-increasing fatigue, nausea vomiting, diarrhea, hair loss, sores in the mouth, increase risk of infection and also neuropathy.  Patient understands treatments are palliative not curative; agrees to proceed with chemotherapy.  Discussed that in general the median survival is in order of 8 to 12 months.   #Worsening cough/respiratory failure-secondary likely progressive disease.  Patient saturation high 70s on room air.  Start on home O2 2 L.-92%.  Recommend home oxygen.  # HTN- poorly controlled. 190-200s in office [ at home 130 as per patient] monitor closely.  #Iron deficiency anemia secondary to chronic bleeding currently improved 11.3.  Monitor closely on chemotherapy.  #IV access-recommend port placement once acute issues improve.  Chemotherapy education.  # DISPOSITION:  # Lab/carbo/taxol (3h) NEW) tomorrow.  Return in: 10 days for lab/[facility]/possible hydration (if needed) (cbc, metb) # Lab/md/carbo/taxol in 3 weeks  # I reviewed the blood work- with the patient in detail; also reviewed the imaging independently [as summarized above]; and with the patient in detail.   # 40 minutes face-to-face with the patient discussing the above plan of care; more than 50% of time spent on prognosis/ natural history; counseling and coordination.

## 2019-11-04 NOTE — Patient Instructions (Incomplete)
Paclitaxel injection What is this medicine? PACLITAXEL (PAK li TAX el) is a chemotherapy drug. It targets fast dividing cells, like cancer cells, and causes these cells to die. This medicine is used to treat ovarian cancer, breast cancer, lung cancer, Kaposi's sarcoma, and other cancers. This medicine may be used for other purposes; ask your health care provider or pharmacist if you have questions. COMMON BRAND NAME(S): Onxol, Taxol What should I tell my health care provider before I take this medicine? They need to know if you have any of these conditions:  history of irregular heartbeat  liver disease  low blood counts, like low white cell, platelet, or red cell counts  lung or breathing disease, like asthma  tingling of the fingers or toes, or other nerve disorder  an unusual or allergic reaction to paclitaxel, alcohol, polyoxyethylated castor oil, other chemotherapy, other medicines, foods, dyes, or preservatives  pregnant or trying to get pregnant  breast-feeding How should I use this medicine? This drug is given as an infusion into a vein. It is administered in a hospital or clinic by a specially trained health care professional. Talk to your pediatrician regarding the use of this medicine in children. Special care may be needed. Overdosage: If you think you have taken too much of this medicine contact a poison control center or emergency room at once. NOTE: This medicine is only for you. Do not share this medicine with others. What if I miss a dose? It is important not to miss your dose. Call your doctor or health care professional if you are unable to keep an appointment. What may interact with this medicine? Do not take this medicine with any of the following medications:  disulfiram  metronidazole This medicine may also interact with the following medications:  antiviral medicines for hepatitis, HIV or AIDS  certain antibiotics like erythromycin and  clarithromycin  certain medicines for fungal infections like ketoconazole and itraconazole  certain medicines for seizures like carbamazepine, phenobarbital, phenytoin  gemfibrozil  nefazodone  rifampin  St. John's wort This list may not describe all possible interactions. Give your health care provider a list of all the medicines, herbs, non-prescription drugs, or dietary supplements you use. Also tell them if you smoke, drink alcohol, or use illegal drugs. Some items may interact with your medicine. What should I watch for while using this medicine? Your condition will be monitored carefully while you are receiving this medicine. You will need important blood work done while you are taking this medicine. This medicine can cause serious allergic reactions. To reduce your risk you will need to take other medicine(s) before treatment with this medicine. If you experience allergic reactions like skin rash, itching or hives, swelling of the face, lips, or tongue, tell your doctor or health care professional right away. In some cases, you may be given additional medicines to help with side effects. Follow all directions for their use. This drug may make you feel generally unwell. This is not uncommon, as chemotherapy can affect healthy cells as well as cancer cells. Report any side effects. Continue your course of treatment even though you feel ill unless your doctor tells you to stop. Call your doctor or health care professional for advice if you get a fever, chills or sore throat, or other symptoms of a cold or flu. Do not treat yourself. This drug decreases your body's ability to fight infections. Try to avoid being around people who are sick. This medicine may increase your risk to bruise   or bleed. Call your doctor or health care professional if you notice any unusual bleeding. Be careful brushing and flossing your teeth or using a toothpick because you may get an infection or bleed more easily.  If you have any dental work done, tell your dentist you are receiving this medicine. Avoid taking products that contain aspirin, acetaminophen, ibuprofen, naproxen, or ketoprofen unless instructed by your doctor. These medicines may hide a fever. Do not become pregnant while taking this medicine. Women should inform their doctor if they wish to become pregnant or think they might be pregnant. There is a potential for serious side effects to an unborn child. Talk to your health care professional or pharmacist for more information. Do not breast-feed an infant while taking this medicine. Men are advised not to father a child while receiving this medicine. This product may contain alcohol. Ask your pharmacist or healthcare provider if this medicine contains alcohol. Be sure to tell all healthcare providers you are taking this medicine. Certain medicines, like metronidazole and disulfiram, can cause an unpleasant reaction when taken with alcohol. The reaction includes flushing, headache, nausea, vomiting, sweating, and increased thirst. The reaction can last from 30 minutes to several hours. What side effects may I notice from receiving this medicine? Side effects that you should report to your doctor or health care professional as soon as possible:  allergic reactions like skin rash, itching or hives, swelling of the face, lips, or tongue  breathing problems  changes in vision  fast, irregular heartbeat  high or low blood pressure  mouth sores  pain, tingling, numbness in the hands or feet  signs of decreased platelets or bleeding - bruising, pinpoint red spots on the skin, black, tarry stools, blood in the urine  signs of decreased red blood cells - unusually weak or tired, feeling faint or lightheaded, falls  signs of infection - fever or chills, cough, sore throat, pain or difficulty passing urine  signs and symptoms of liver injury like dark yellow or brown urine; general ill feeling or  flu-like symptoms; light-colored stools; loss of appetite; nausea; right upper belly pain; unusually weak or tired; yellowing of the eyes or skin  swelling of the ankles, feet, hands  unusually slow heartbeat Side effects that usually do not require medical attention (report to your doctor or health care professional if they continue or are bothersome):  diarrhea  hair loss  loss of appetite  muscle or joint pain  nausea, vomiting  pain, redness, or irritation at site where injected  tiredness This list may not describe all possible side effects. Call your doctor for medical advice about side effects. You may report side effects to FDA at 1-800-FDA-1088. Where should I keep my medicine? This drug is given in a hospital or clinic and will not be stored at home. NOTE: This sheet is a summary. It may not cover all possible information. If you have questions about this medicine, talk to your doctor, pharmacist, or health care provider.  2020 [employer] Standard (2017-04-15 13:14:55) Carboplatin injection What is this medicine? CARBOPLATIN (KAR boe pla tin) is a chemotherapy drug. It targets fast dividing cells, like cancer cells, and causes these cells to die. This medicine is used to treat ovarian cancer and many other cancers. This medicine may be used for other purposes; ask your health care provider or pharmacist if you have questions. COMMON BRAND NAME(S): Paraplatin What should I tell my health care provider before I take this medicine? They need to   know if you have any of these conditions:  blood disorders  hearing problems  kidney disease  recent or ongoing radiation therapy  an unusual or allergic reaction to carboplatin, cisplatin, other chemotherapy, other medicines, foods, dyes, or preservatives  pregnant or trying to get pregnant  breast-feeding How should I use this medicine? This drug is usually given as an infusion into a vein. It is administered in a  hospital or clinic by a specially trained health care professional. Talk to your pediatrician regarding the use of this medicine in children. Special care may be needed. Overdosage: If you think you have taken too much of this medicine contact a poison control center or emergency room at once. NOTE: This medicine is only for you. Do not share this medicine with others. What if I miss a dose? It is important not to miss a dose. Call your doctor or health care professional if you are unable to keep an appointment. What may interact with this medicine?  medicines for seizures  medicines to increase blood counts like filgrastim, pegfilgrastim, sargramostim  some antibiotics like amikacin, gentamicin, neomycin, streptomycin, tobramycin  vaccines Talk to your doctor or health care professional before taking any of these medicines:  acetaminophen  aspirin  ibuprofen  ketoprofen  naproxen This list may not describe all possible interactions. Give your health care provider a list of all the medicines, herbs, non-prescription drugs, or dietary supplements you use. Also tell them if you smoke, drink alcohol, or use illegal drugs. Some items may interact with your medicine. What should I watch for while using this medicine? Your condition will be monitored carefully while you are receiving this medicine. You will need important blood work done while you are taking this medicine. This drug may make you feel generally unwell. This is not uncommon, as chemotherapy can affect healthy cells as well as cancer cells. Report any side effects. Continue your course of treatment even though you feel ill unless your doctor tells you to stop. In some cases, you may be given additional medicines to help with side effects. Follow all directions for their use. Call your doctor or health care professional for advice if you get a fever, chills or sore throat, or other symptoms of a cold or flu. Do not treat  yourself. This drug decreases your body's ability to fight infections. Try to avoid being around people who are sick. This medicine may increase your risk to bruise or bleed. Call your doctor or health care professional if you notice any unusual bleeding. Be careful brushing and flossing your teeth or using a toothpick because you may get an infection or bleed more easily. If you have any dental work done, tell your dentist you are receiving this medicine. Avoid taking products that contain aspirin, acetaminophen, ibuprofen, naproxen, or ketoprofen unless instructed by your doctor. These medicines may hide a fever. Do not become pregnant while taking this medicine. Women should inform their doctor if they wish to become pregnant or think they might be pregnant. There is a potential for serious side effects to an unborn child. Talk to your health care professional or pharmacist for more information. Do not breast-feed an infant while taking this medicine. What side effects may I notice from receiving this medicine? Side effects that you should report to your doctor or health care professional as soon as possible:  allergic reactions like skin rash, itching or hives, swelling of the face, lips, or tongue  signs of infection - fever or   chills, cough, sore throat, pain or difficulty passing urine  signs of decreased platelets or bleeding - bruising, pinpoint red spots on the skin, black, tarry stools, nosebleeds  signs of decreased red blood cells - unusually weak or tired, fainting spells, lightheadedness  breathing problems  changes in hearing  changes in vision  chest pain  high blood pressure  low blood counts - This drug may decrease the number of white blood cells, red blood cells and platelets. You may be at increased risk for infections and bleeding.  nausea and vomiting  pain, swelling, redness or irritation at the injection site  pain, tingling, numbness in the hands or  feet  problems with balance, talking, walking  trouble passing urine or change in the amount of urine Side effects that usually do not require medical attention (report to your doctor or health care professional if they continue or are bothersome):  hair loss  loss of appetite  metallic taste in the mouth or changes in taste This list may not describe all possible side effects. Call your doctor for medical advice about side effects. You may report side effects to FDA at 1-800-FDA-1088. Where should I keep my medicine? This drug is given in a hospital or clinic and will not be stored at home. NOTE: This sheet is a summary. It may not cover all possible information. If you have questions about this medicine, talk to your doctor, pharmacist, or health care provider.  2020 [employer]/Gold Standard (2007-11-17 14:38:05)  

## 2019-11-04 NOTE — Progress Notes (Signed)
START OFF PATHWAY REGIMEN - Other   [id number]:Carboplatin AUC=6 + Paclitaxel 175 mg/m2 q21 Days:   A cycle is every 21 days:     Paclitaxel      Carboplatin   **Always confirm dose/schedule in your pharmacy ordering system**  Patient Characteristics: Intent of Therapy: Non-Curative / Palliative Intent, Discussed with Patient

## 2019-11-04 NOTE — Progress Notes (Signed)
[place] CONSULT NOTE  Patient Care Team: Olivia Harrier, MD as PCP - General (Internal Medicine) Olivia Jacks, RN as Oncology Nurse Navigator Olivia Filbert, MD as Referring Physician (Radiation Oncology)  CHIEF COMPLAINTS/PURPOSE OF CONSULTATION: Endometrial cancer  #  Oncology History Overview Note  #  OCT 2020- high-grade DEDIFFERENTIATED ENDOMETRIAL CARCINOMA s/p TAH-BSO and pelvic lymph node [facility]; Dr.Berchuck] Tumor measured 9.7 cm, dedifferentiated carcinoma with almost complete involvement of the myometrium (93%- 68m of 29 mm).  LVSI was present in cervical stroma.  Vaginal cuff margin was clear at 8 mm.  1 enlarged lymph node was negative for malignancy.   # DEC 20177-[id number]-EBRT; followed by [facility]  [finish feb 1st,2021; Dr.Crystal]  # JAN 15th 2021- LETROZOLE; DISCONTINUE march 11th,2021-progressive disease.  # MARCH 12 th, 2021- CARBO-TAXOL q3 W.   # NGS/MOLECULAR TESTS:P; Mismatch repair: INTACT [[facility] 05/2019]    # PALLIATIVE CARE EVALUATION: O -1/15.   # PAIN MANAGEMENT: NA   DIAGNOSIS: Dedifferentiated-Endometrial carcinoma  STAGE:    4     ;  GOALS: Palliation  CURRENT/MOST RECENT THERAPY : CARBO-TAXOL [C]    [place]rial cancer ([place]  05/21/2019 Initial Diagnosis   Endometrial cancer (HCC)   Endometrial stromal sarcoma (HCC)  05/21/2019 Initial Diagnosis   Endometrial stromal sarcoma (HCC)   Overlapping malignant neoplasm of uterine cervix ([facility]  11/04/2019 Initial Diagnosis   Overlapping malignant neoplasm of uterine cervix ([facility]   11/05/2019 -  Chemotherapy   The patient had palonosetron (ALOXI) injection 0.25 mg, 0.25 mg, Intravenous,  Once, 0 of 6 cycles pegfilgrastim (NEULASTA ONPRO KIT) injection 6 mg, 6 mg, Subcutaneous, Once, 0 of 6 cycles CARBOplatin (PARAPLATIN) 740 mg in sodium chloride 0.9 % 250 mL chemo infusion, 740 mg (100 % of original dose 741 mg), Intravenous,  Once, 0 of 6  cycles Dose modification:   (original dose 741 mg, Cycle 1) fosaprepitant (EMEND) 150 mg in sodium chloride 0.9 % 145 mL IVPB, 150 mg, Intravenous,  Once, 0 of 6 cycles PACLitaxel (TAXOL) 390 mg in sodium chloride 0.9 % 500 mL chemo infusion (> 859mm2), 175 mg/m2 = 390 mg, Intravenous,  Once, 0 of 6 cycles  for chemotherapy treatment.       HISTORY OF PRESENTING ILLNESS:  [name].  female history of locally recurrent endometrial dedifferentiated carcinoma status post radiation is here for follow-up/review results of chest x-ray.  Patient had a chest x-ray done for worsening shortness of breath-which was grossly abnormal suggestive of significant progressive disease.  Patient is recommended by daughter discussed chemotherapy.  Patient complains of worsening cough.  No hemoptysis or sputum production. No fever no chills.  Patient noted to have desaturation high 70s on room air.  Review of Systems  Constitutional: Positive for malaise/fatigue and weight loss. Negative for chills, diaphoresis and fever.  HENT: Negative for nosebleeds and sore throat.   Eyes: Negative for double vision.  Respiratory: Positive for cough and shortness of breath. Negative for hemoptysis, sputum production and wheezing.   Cardiovascular: Positive for leg swelling. Negative for chest pain, palpitations and orthopnea.  Gastrointestinal: Negative for abdominal pain, blood in stool, constipation, diarrhea, heartburn, melena, nausea and vomiting.  Genitourinary: Negative for dysuria, frequency and urgency.  Musculoskeletal: Positive for back pain and joint pain.  Skin: Negative.  Negative for itching and rash.  Neurological: Negative for dizziness, tingling, focal weakness, weakness and headaches.  Endo/Heme/Allergies: Does not bruise/bleed easily.  Psychiatric/Behavioral: Negative for depression. The patient  is not nervous/anxious and does not have insomnia.      MEDICAL HISTORY:  Past Medical History:   Diagnosis Date  . Arthritis   . Hypertension     SURGICAL HISTORY: Past Surgical History:  Procedure Laterality Date  . CESAREAN SECTION    . DILATION AND CURETTAGE OF UTERUS N/A 05/21/2019   Procedure: DILATATION AND CURETTAGE, FRACTIONAL;  Surgeon: Schermerhorn, Gwen Her, MD;  Location: [facility] ORS;  Service: Gynecology;  Laterality: N/A;  . JOINT REPLACEMENT    . REFRACTIVE SURGERY Bilateral 1999  . TONSILLECTOMY    . TOTAL HIP ARTHROPLASTY Left 10/06/2018   Procedure: TOTAL HIP ARTHROPLASTY ANTERIOR APPROACH-LEFT;  Surgeon: Hessie Knows, MD;  Location: [facility] ORS;  Service: Orthopedics;  Laterality: Left;    SOCIAL HISTORY: Social History   Socioeconomic History  . Marital status: Single    Spouse name: Not on file  . Number of children: Not on file  . Years of education: Not on file  . Highest education level: Not on file  Occupational History  . Not on file  Tobacco Use  . Smoking status: Never Smoker  . Smokeless tobacco: Never Used  Substance and Sexual Activity  . Alcohol use: Not Currently    Comment: Occasional wine or mixed drink  . Drug use: Never  . Sexual activity: Not Currently  Other Topics Concern  . Not on file  Social History Narrative   Daughter works for [employer]; lives at home by herself in [place]; never smoked; rare alcohol. 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    Social Determinants of Health   Financial Resource Strain:   . Difficulty of Paying Living Expenses:   Food Insecurity:   . Worried About Charity fundraiser in the Last Year:   . Arboriculturist in the Last Year:   Transportation Needs:   . Film/video editor (Medical):   Marland Kitchen Lack of Transportation (Non-Medical):   Physical Activity:   . Days of Exercise per Week:   . Minutes of Exercise per Session:   Stress:   . Feeling of Stress :   Social Connections:   . Frequency of Communication with Friends and Family:   . Frequency of Social Gatherings with Friends and Family:    . Attends Religious Services:   . Active Member of Clubs or Organizations:   . Attends Archivist Meetings:   Marland Kitchen Marital Status:   Intimate Partner Violence:   . Fear of Current or Ex-Partner:   . Emotionally Abused:   Marland Kitchen Physically Abused:   . Sexually Abused:     FAMILY HISTORY: No family history on file.  ALLERGIES:  is allergic to azithromycin.  MEDICATIONS:  Current Outpatient Medications  Medication Sig Dispense Refill  . acetaminophen (TYLENOL) 325 MG tablet Take 650 mg by mouth every 6 (six) hours as needed.    . Cholecalciferol (VITAMIN D3 PO) Take 1 tablet by mouth daily.    . ferrous sulfate 325 (65 FE) MG EC tablet Take 325 mg by mouth daily with breakfast.    . hydrochlorothiazide (HYDRODIURIL) 25 MG tablet Take 1 tablet (25 mg total) by mouth daily. 30 tablet 0  . ibuprofen (ADVIL) 200 MG tablet Take 200 mg by mouth every 6 (six) hours as needed.    Marland Kitchen letrozole (FEMARA) 2.5 MG tablet Take 1 tablet (2.5 mg total) by mouth daily. Once a day. 30 tablet 3  . Potassium (GNP POTASSIUM) 99 MG TABS Take by mouth.    Marland Kitchen  saccharomyces boulardii (FLORASTOR) 250 MG capsule Take 250 mg by mouth daily.     . traMADol (ULTRAM) 50 MG tablet Take by mouth.     No current facility-administered medications for this visit.      Marland Kitchen  PHYSICAL EXAMINATION: ECOG PERFORMANCE STATUS: 2 - Symptomatic, <50% confined to bed  Vitals:   11/04/19 1101 11/04/19 1107  BP: (!) 206/80   Pulse: 90   Resp: (!) 26   Temp: 98.3 F (36.8 C)   SpO2: (!) 78% 91%   There were no vitals filed for this visit.  Physical Exam  Constitutional: She is oriented to person, place, and time and well-developed, well-nourished, and in no distress.  Accompanied by her daughter.  Obese.  In a wheelchair.  HENT:  Head: Normocephalic and atraumatic.  Mouth/Throat: Oropharynx is clear and moist. No oropharyngeal exudate.  Eyes: Pupils are equal, round, and reactive to light.  Cardiovascular: Normal  rate and regular rhythm.  Pulmonary/Chest: No respiratory distress. She has no wheezes.  Clear to auscultation bilaterally.  No wheeze or crackles  Abdominal: Soft. Bowel sounds are normal. She exhibits no distension and no mass. There is no abdominal tenderness. There is no rebound and no guarding.  Musculoskeletal:        General: Edema present. No tenderness. Normal range of motion.     Cervical back: Normal range of motion and neck supple.  Neurological: She is alert and oriented to person, place, and time.  Skin: Skin is warm.  Psychiatric: Affect normal.     LABORATORY DATA:  I have reviewed the data as listed Lab Results  Component Value Date   WBC 7.7 11/02/2019   HGB 11.3 (L) 11/02/2019   HCT 37.4 11/02/2019   MCV 88.4 11/02/2019   PLT 443 (H) 11/02/2019   Recent Labs    11/05/18 2020 11/07/18 0504 05/18/19 1405 05/18/19 1405 05/21/19 1057 09/10/19 1004 11/02/19 1304  NA 135   < > 140   < > 139 136 138  K 3.2*   < > 3.3*   < > 3.4* 3.8 3.4*  CL 97*   < > 106   < > 102 102 98  CO2 30   < > 26  --   --  26 27  GLUCOSE 132*   < > 95   < > 84 102* 116*  BUN 14   < > 27*   < > 20 24* 25*  CREATININE 0.83   < > 0.84   < > 0.80 0.69 0.56  CALCIUM 8.0*   < > 9.3  --   --  9.2 9.6  GFRNONAA >60   < > >60  --   --  >60 >60  GFRAA >60   < > >60  --   --  >60 >60  PROT 5.0*  --   --   --   --  7.3  --   ALBUMIN 2.4*  --   --   --   --  3.8  --   AST 29  --   --   --   --  19  --   ALT 16  --   --   --   --  14  --   ALKPHOS 90  --   --   --   --  93  --   BILITOT 0.5  --   --   --   --  0.4  --   BILIDIR  0.1  --   --   --   --   --   --   IBILI 0.4  --   --   --   --   --   --    < > = values in this interval not displayed.    RADIOGRAPHIC STUDIES: I have personally reviewed the radiological images as listed and agreed with the findings in the report. DG Chest 2 View  Result Date: 11/02/2019 CLINICAL DATA:  History of sarcoma EXAM: CHEST - 2 VIEW COMPARISON:   PET-CT from 08/18/2019 FINDINGS: Cardiac shadow is mildly enlarged. Aortic calcifications are seen. There are innumerable nodules identified throughout both lungs significantly increased when compared with the prior exam. The largest of these lies in the lateral aspect of the left lung measuring approximately 4.4 cm. No acute bony abnormality is seen. IMPRESSION: Significant increase in size and number of pulmonary nodules consistent with progressive metastatic disease. Electronically Signed   By: Inez Catalina M.D.   On: 11/02/2019 15:32    ASSESSMENT & PLAN:   Overlapping malignant neoplasm of uterine cervix ([place]) # ENDOMETRIAL dedifferentiated carcinoma high grade-s/p TAH & BSO; with vaginal recurrence on EBRT; s/p Deon Pilling finish Feb 1st 2021].  Unfortunately chest x-ray suggestive of significant progression/recurrence of her endometrial malignancy.  Patient is quite symptomatic-ongoing desaturation/worsening cough.  Patient will need CT chest and pelvis when acute issues improve; this could be ordered at next visit.  Discussed with Dr. Fransisca Connors.  #Recommend discontinuation of letrozole.  Recommend carbotaxol chemotherapy every 3 weeks. Discussed the potential side effects including but not limited to-increasing fatigue, nausea vomiting, diarrhea, hair loss, sores in the mouth, increase risk of infection and also neuropathy.  Patient understands treatments are palliative not curative; agrees to proceed with chemotherapy.  Discussed that in general the median survival is in order of 8 to 12 months.   # HTN- poorly controlled. 190-200s in office [ at home 130 as per patient] monitor closely.  #Iron deficiency anemia secondary to chronic bleeding currently improved 11.3.  Monitor closely on chemotherapy.  # DISPOSITION:  # Lab/carbo/taxol (3h) NEW) tomorrow.  Return in: 10 days for lab/[facility]/possible hydration (if needed) (cbc, metb) # Lab/md/carbo/taxol in 3 weeks  # I reviewed the blood work-  with the patient in detail; also reviewed the imaging independently [as summarized above]; and with the patient in detail.   # 40 minutes face-to-face with the patient discussing the above plan of care; more than 50% of time spent on prognosis/ natural history; counseling and coordination.   All questions were answered. The patient knows to call the clinic with any problems, questions or concerns.    Cammie Sickle, MD 11/04/2019 10:12 PM

## 2019-11-05 ENCOUNTER — Telehealth: Payer: Self-pay | Admitting: *Deleted

## 2019-11-05 ENCOUNTER — Inpatient Hospital Stay: Payer: Medicare Other

## 2019-11-05 ENCOUNTER — Other Ambulatory Visit: Payer: Self-pay | Admitting: *Deleted

## 2019-11-05 ENCOUNTER — Telehealth: Payer: Self-pay | Admitting: Internal Medicine

## 2019-11-05 DIAGNOSIS — C538 Malignant neoplasm of overlapping sites of cervix uteri: Secondary | ICD-10-CM

## 2019-11-05 NOTE — Telephone Encounter (Signed)
Patient called cancer center and requested to post pone her treatments until Monday. She is not up "to a 6 hour treatment today."  Dr. Rogue Bussing reached out to the patient personally to discuss patient's concerns. He advised her to keep the apt today, but patient declines. Patient refused to allow Dr. Rogue Bussing to contact her daughter to discuss the delay in getting started on the treatments.  Colette, please r/s patient's chemotherapy treatment for next week. Thanks.

## 2019-11-05 NOTE — Telephone Encounter (Signed)
I personally spoke to the patient regarding her request to reschedule the chemotherapy appointment for next week.  Patient states she feels weak/tired and hence wants to postpone chemotherapy next week.  Discussed with the patient that she is at very high risk of rapid decompensation given her significant pulmonary disease burden.  Discussed with the patient that she should go to the emergency room if she clinically worsens.  Offered to speak to the patient's daughter; she declines.  Patient has appointment on March 19/carbotaxol chemotherapy.

## 2019-11-05 NOTE — Telephone Encounter (Signed)
Per Dr. Rogue Bussing - Please add patient to see MD next week - 11/12/2019.  Also need to move apts on 3/22 out by 1 week and next chemo on 4/2 to 4/9

## 2019-11-08 ENCOUNTER — Telehealth: Payer: Self-pay | Admitting: Internal Medicine

## 2019-11-08 ENCOUNTER — Telehealth: Payer: Self-pay | Admitting: *Deleted

## 2019-11-08 ENCOUNTER — Ambulatory Visit: Admission: RE | Admit: 2019-11-08 | Payer: Medicare Other | Source: Ambulatory Visit | Admitting: Radiation Oncology

## 2019-11-08 NOTE — Telephone Encounter (Signed)
Spoke to patient's daughter, [name]. Her mother is short of breath at rest with oxygen, feels labored breathing, uncertain that mother has had urine or bowel movement in last 24 hours, minimal to sips of oral intake and quite weak. Discussed that her symptoms are likely secondary to malignancy and I recommended EMS to ER for evaluation. We also discussed role of palliative care and hospice. Vera and her mother have spoken regarding these previously but not made decisions. I will copy Josh Borders, NP/Palliative Care on this note. Unclear if patient will be a candidate for chemotherapy as previously planned which I advised Vera of. Dr. Rogue Bussing updated.

## 2019-11-08 NOTE — Telephone Encounter (Signed)
On 1/15-I had a long discussion with the patient's daughter regarding progressive disease-and with progressive symptoms that patient needs to go to the emergency room for further evaluation.  Daughter states her mother always been in "denial" of her disease status.  Discussed that given the progressive symptoms-respiratory failure-the benefits of chemotherapy would be outweighed by the risk/toxicity of chemotherapy.  In general given patient reluctance/progressive disease-hospice would be very reasonable.   For now I reminded the patient daughter to call EMS and transfer the patient to the emergency room.   Thanks, GB

## 2019-11-08 NOTE — Telephone Encounter (Signed)
x

## 2019-11-08 NOTE — Telephone Encounter (Signed)
Daughter [name] called reporting htat patient is not doing well and needs to speak with someone regarding the fact that patient is resistant to things, no energy not eating much O2 not good Not sure what to do or how to help her. Not sure that she  Will be able to get her to her appointment Friday. Please return her call [phone]

## 2019-11-10 ENCOUNTER — Inpatient Hospital Stay
Admission: EM | Admit: 2019-11-10 | Discharge: 2019-11-25 | DRG: 951 | Disposition: E | Payer: Medicare Other | Attending: Student | Admitting: Student

## 2019-11-10 ENCOUNTER — Telehealth: Payer: Self-pay | Admitting: Internal Medicine

## 2019-11-10 ENCOUNTER — Telehealth: Payer: Self-pay | Admitting: *Deleted

## 2019-11-10 ENCOUNTER — Emergency Department: Payer: Medicare Other

## 2019-11-10 ENCOUNTER — Other Ambulatory Visit: Payer: Self-pay

## 2019-11-10 DIAGNOSIS — J9621 Acute and chronic respiratory failure with hypoxia: Secondary | ICD-10-CM | POA: Diagnosis present

## 2019-11-10 DIAGNOSIS — J9601 Acute respiratory failure with hypoxia: Secondary | ICD-10-CM

## 2019-11-10 DIAGNOSIS — C541 Malignant neoplasm of endometrium: Secondary | ICD-10-CM | POA: Diagnosis present

## 2019-11-10 DIAGNOSIS — Z66 Do not resuscitate: Secondary | ICD-10-CM | POA: Diagnosis present

## 2019-11-10 DIAGNOSIS — C7801 Secondary malignant neoplasm of right lung: Secondary | ICD-10-CM | POA: Diagnosis present

## 2019-11-10 DIAGNOSIS — C7802 Secondary malignant neoplasm of left lung: Secondary | ICD-10-CM | POA: Diagnosis present

## 2019-11-10 DIAGNOSIS — I10 Essential (primary) hypertension: Secondary | ICD-10-CM | POA: Diagnosis present

## 2019-11-10 DIAGNOSIS — J9600 Acute respiratory failure, unspecified whether with hypoxia or hypercapnia: Secondary | ICD-10-CM | POA: Diagnosis present

## 2019-11-10 DIAGNOSIS — Z90722 Acquired absence of ovaries, bilateral: Secondary | ICD-10-CM | POA: Diagnosis not present

## 2019-11-10 DIAGNOSIS — Z20822 Contact with and (suspected) exposure to covid-19: Secondary | ICD-10-CM | POA: Diagnosis present

## 2019-11-10 DIAGNOSIS — Z515 Encounter for palliative care: Secondary | ICD-10-CM | POA: Diagnosis present

## 2019-11-10 DIAGNOSIS — Z923 Personal history of irradiation: Secondary | ICD-10-CM | POA: Diagnosis not present

## 2019-11-10 DIAGNOSIS — J918 Pleural effusion in other conditions classified elsewhere: Secondary | ICD-10-CM | POA: Diagnosis present

## 2019-11-10 DIAGNOSIS — Z96642 Presence of left artificial hip joint: Secondary | ICD-10-CM | POA: Diagnosis present

## 2019-11-10 DIAGNOSIS — C538 Malignant neoplasm of overlapping sites of cervix uteri: Secondary | ICD-10-CM

## 2019-11-10 DIAGNOSIS — Z9221 Personal history of antineoplastic chemotherapy: Secondary | ICD-10-CM

## 2019-11-10 DIAGNOSIS — Z90710 Acquired absence of both cervix and uterus: Secondary | ICD-10-CM | POA: Diagnosis not present

## 2019-11-10 LAB — LACTIC ACID, PLASMA
Lactic Acid, Venous: 1.3 mmol/L (ref 0.5–1.9)
Lactic Acid, Venous: 1.6 mmol/L (ref 0.5–1.9)

## 2019-11-10 LAB — CBC WITH DIFFERENTIAL/PLATELET
Abs Immature Granulocytes: 0.1 10*3/uL — ABNORMAL HIGH (ref 0.00–0.07)
Basophils Absolute: 0 10*3/uL (ref 0.0–0.1)
Basophils Relative: 0 %
Eosinophils Absolute: 0 10*3/uL (ref 0.0–0.5)
Eosinophils Relative: 0 %
HCT: 34.4 % — ABNORMAL LOW (ref 36.0–46.0)
Hemoglobin: 11.2 g/dL — ABNORMAL LOW (ref 12.0–15.0)
Immature Granulocytes: 1 %
Lymphocytes Relative: 2 %
Lymphs Abs: 0.4 10*3/uL — ABNORMAL LOW (ref 0.7–4.0)
MCH: 28.1 pg (ref 26.0–34.0)
MCHC: 32.6 g/dL (ref 30.0–36.0)
MCV: 86.2 fL (ref 80.0–100.0)
Monocytes Absolute: 1.5 10*3/uL — ABNORMAL HIGH (ref 0.1–1.0)
Monocytes Relative: 9 %
Neutro Abs: 15.7 10*3/uL — ABNORMAL HIGH (ref 1.7–7.7)
Neutrophils Relative %: 88 %
Platelets: 499 10*3/uL — ABNORMAL HIGH (ref 150–400)
RBC: 3.99 MIL/uL (ref 3.87–5.11)
RDW: 16.8 % — ABNORMAL HIGH (ref 11.5–15.5)
WBC: 17.8 10*3/uL — ABNORMAL HIGH (ref 4.0–10.5)
nRBC: 0 % (ref 0.0–0.2)

## 2019-11-10 LAB — BRAIN NATRIURETIC PEPTIDE: B Natriuretic Peptide: 79 pg/mL (ref 0.0–100.0)

## 2019-11-10 LAB — URINALYSIS, COMPLETE (UACMP) WITH MICROSCOPIC
Bilirubin Urine: NEGATIVE
Glucose, UA: NEGATIVE mg/dL
Ketones, ur: 20 mg/dL — AB
Nitrite: NEGATIVE
Protein, ur: 100 mg/dL — AB
Specific Gravity, Urine: 1.028 (ref 1.005–1.030)
WBC, UA: >50 WBC/hpf — ABNORMAL HIGH (ref 0–5)
pH: 5 (ref 5.0–8.0)

## 2019-11-10 LAB — COMPREHENSIVE METABOLIC PANEL
ALT: 20 U/L (ref 0–44)
AST: 21 U/L (ref 15–41)
Albumin: 3 g/dL — ABNORMAL LOW (ref 3.5–5.0)
Alkaline Phosphatase: 127 U/L — ABNORMAL HIGH (ref 38–126)
Anion gap: 10 (ref 5–15)
BUN: 31 mg/dL — ABNORMAL HIGH (ref 8–23)
CO2: 36 mmol/L — ABNORMAL HIGH (ref 22–32)
Calcium: 9.9 mg/dL (ref 8.9–10.3)
Chloride: 88 mmol/L — ABNORMAL LOW (ref 98–111)
Creatinine, Ser: 0.59 mg/dL (ref 0.44–1.00)
GFR calc Af Amer: >60 mL/min (ref >60)
GFR calc non Af Amer: >60 mL/min (ref >60)
Glucose, Bld: 133 mg/dL — ABNORMAL HIGH (ref 70–99)
Potassium: 3.9 mmol/L (ref 3.5–5.1)
Sodium: 134 mmol/L — ABNORMAL LOW (ref 135–145)
Total Bilirubin: 0.8 mg/dL (ref 0.3–1.2)
Total Protein: 7.2 g/dL (ref 6.5–8.1)

## 2019-11-10 LAB — PROCALCITONIN: Procalcitonin: <0.1 ng/mL

## 2019-11-10 LAB — TROPONIN I (HIGH SENSITIVITY)
Troponin I (High Sensitivity): 71 ng/L — ABNORMAL HIGH (ref <18)
Troponin I (High Sensitivity): 64 ng/L — ABNORMAL HIGH (ref <18)

## 2019-11-10 LAB — RESPIRATORY PANEL BY RT PCR (FLU A&B, COVID)
Influenza A by PCR: NEGATIVE
Influenza B by PCR: NEGATIVE
SARS Coronavirus 2 by RT PCR: NEGATIVE

## 2019-11-10 MED ORDER — MORPHINE SULFATE 2 MG/ML IJ SOLN
2.0000 mg | INTRAMUSCULAR | Status: DC | PRN
  Administered 2019-11-10: 4 mg via INTRAVENOUS
  Administered 2019-11-11 (×2): 2 mg via INTRAVENOUS
  Filled 2019-11-10 (×6): qty 2

## 2019-11-10 MED ORDER — SODIUM CHLORIDE 0.9% FLUSH
10.0000 mL | Freq: Two times a day (BID) | INTRAVENOUS | Status: DC
  Administered 2019-11-11: 02:00:00 10 mL

## 2019-11-10 MED ORDER — SODIUM CHLORIDE 0.9% FLUSH: 10.0000 mL | INTRAVENOUS | Status: DC | PRN

## 2019-11-10 MED ORDER — LEVOFLOXACIN IN D5W 750 MG/150ML IV SOLN
750.0000 mg | Freq: Once | INTRAVENOUS | Status: AC
  Administered 2019-11-10: 750 mg via INTRAVENOUS
  Filled 2019-11-10: qty 150

## 2019-11-10 MED ORDER — LORAZEPAM 2 MG/ML IJ SOLN
1.0000 mg | INTRAMUSCULAR | Status: DC | PRN
  Administered 2019-11-10: 18:00:00 2 mg via INTRAVENOUS
  Filled 2019-11-10: qty 1

## 2019-11-10 MED ORDER — ALBUTEROL SULFATE (2.5 MG/3ML) 0.083% IN NEBU: 5.0000 mg | INHALATION_SOLUTION | Freq: Once | RESPIRATORY_TRACT | Status: DC

## 2019-11-10 NOTE — Consult Note (Signed)
Olivia Rodriguez  [phone] [phone]   Name: Olivia Rodriguez Date: 10/28/2019 MRN: [id number]  DOB: 02-02-1951  Patient Care Team: Tracie Harrier, MD as PCP - General (Internal Medicine) Clent Jacks, RN as Oncology Nurse Navigator Noreene Filbert, MD as Referring Physician (Radiation Oncology)    REASON FOR CONSULTATION: Olivia Rodriguez is a 68 y.o. 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  Patient has significantly declined over the past week with increased weakness, poor oral intake, and progressive shortness of breath.  EMS was called to the home as patient was no longer able to provide self-care.  In the ER she was found to be hypoxic requiring O2 via high flow nasal cannula.  Chest x-ray consistent with large bilateral effusions.  Palliative care was consulted to help address goals.  SOCIAL HISTORY:     reports that she has never smoked. She has never used smokeless tobacco. She reports previous alcohol use. She reports that she does not use drugs.   Patient is divorced.  She lives at home alone.  She has a daughter who is involved in her care.  Patient worked as a Barista.  ADVANCE DIRECTIVES:  Not on file  CODE STATUS: DNR/DNI  PAST MEDICAL HISTORY: Past Medical History:  Diagnosis Date  . Arthritis   . Hypertension     PAST SURGICAL HISTORY:  Past Surgical History:  Procedure Laterality Date  . CESAREAN SECTION    . DILATION AND CURETTAGE OF UTERUS N/A 05/21/2019   Procedure: DILATATION AND CURETTAGE, FRACTIONAL;  Surgeon: Schermerhorn, Gwen Her, MD;  Location: [facility] ORS;  Service: Gynecology;  Laterality: N/A;  . JOINT REPLACEMENT    . REFRACTIVE SURGERY Bilateral 1999  . TONSILLECTOMY    . TOTAL HIP ARTHROPLASTY Left 10/06/2018   Procedure: TOTAL HIP ARTHROPLASTY  ANTERIOR APPROACH-LEFT;  Surgeon: Hessie Knows, MD;  Location: [facility] ORS;  Service: Orthopedics;  Laterality: Left;    HEMATOLOGY/ONCOLOGY HISTORY:  Oncology History Overview Note  #  OCT 2020- high-grade DEDIFFERENTIATED ENDOMETRIAL CARCINOMA s/p TAH-BSO and pelvic lymph node [[facility]; Dr.Berchuck] Tumor measured 9.7 cm, dedifferentiated carcinoma with almost complete involvement of the myometrium (93%- 77m of 29 mm).  LVSI was present in cervical stroma.  Vaginal cuff margin was clear at 8 mm.  1 enlarged lymph node was negative for malignancy.   # DEC 21287-OM[facility]HMCrecurrence-EBRT; followed by [facility]  [finish feb 1st,2021; Dr.Crystal]  # JAN 15th 2021- LETROZOLE; DISCONTINUE march 11th,2021-progressive disease.  # MARCH 12 th, 2021- CARBO-TAXOL q3 W.   # NGS/MOLECULAR TESTS:P; Mismatch repair: INTACT [[facility] 05/2019]    # PALLIATIVE CARE EVALUATION: O -1/15.   # PAIN MANAGEMENT: NA   DIAGNOSIS: Dedifferentiated-Endometrial carcinoma  STAGE:    4     ;  GOALS: Palliation  CURRENT/MOST RECENT THERAPY : CARBO-TAXOL [C]    [place]rial [place]  05/21/2019 Initial Diagnosis   [place] [facility] [place]   Endometrial stromal sarcoma ([facility]  05/21/2019 Initial Diagnosis   Endometrial stromal sarcoma ([facility])   Overlapping malignant neoplasm of uterine cervix ([facility]  11/04/2019 Initial Diagnosis   Overlapping malignant [place] of uterine cervix ([facility]   11/05/2019 -  Chemotherapy   The patient had palonosetron (ALOXI) injection 0.25 mg, 0.25 mg, Intravenous,  Once, 0 of 6 cycles pegfilgrastim (NEULASTA ONPRO KIT) injection 6 mg, 6 mg, Subcutaneous, Once, 0 of 6 cycles  CARBOplatin (PARAPLATIN) 740 mg in sodium chloride 0.9 % 250 mL chemo infusion, 740 mg (100 % of original dose 741 mg), Intravenous,  Once, 0 of 6 cycles Dose modification:   (original dose 741 mg, Cycle 1) fosaprepitant (EMEND) 150 mg in sodium chloride 0.9 % 145 mL IVPB, 150 mg, Intravenous,  Once, 0 of 6  cycles PACLitaxel (TAXOL) 390 mg in sodium chloride 0.9 % 500 mL chemo infusion (> 55m/m2), 175 mg/m2 = 390 mg, Intravenous,  Once, 0 of 6 cycles  for chemotherapy treatment.      ALLERGIES:  is allergic to azithromycin.  MEDICATIONS:  Current Facility-Administered Medications  Medication Dose Route Frequency Provider Last Rate Last Admin  . LORazepam (ATIVAN) injection 1-2 mg  1-2 mg Intravenous Q4H PRN Nadir Vasques, JVonna KotykR, NP      . morphine 2 MG/ML injection 2-4 mg  2-4 mg Intravenous Q2H PRN Teonna Coonan, JKirt Boys NP       Current Outpatient Medications  Medication Sig Dispense Refill  . Cholecalciferol (VITAMIN D3 PO) Take 1 tablet by mouth daily.    . ferrous sulfate 325 (65 FE) MG EC tablet Take 325 mg by mouth daily with breakfast.    . hydrochlorothiazide (HYDRODIURIL) 25 MG tablet Take 1 tablet (25 mg total) by mouth daily. 30 tablet 0  . Potassium (POTASSIMIN PO) Take by mouth.    . saccharomyces boulardii (FLORASTOR) 250 MG capsule Take 250 mg by mouth daily.     .Marland Kitchenacetaminophen (TYLENOL) 325 MG tablet Take 650 mg by mouth every 6 (six) hours as needed.    .Marland Kitchenibuprofen (ADVIL) 200 MG tablet Take 200 mg by mouth every 6 (six) hours as needed.    .Marland Kitchenletrozole (FEMARA) 2.5 MG tablet Take 1 tablet (2.5 mg total) by mouth daily. Once a day. (Patient not taking: Reported on 11/01/2019) 30 tablet 3    VITAL SIGNS: BP (!) 171/104 (BP Location: Right Arm)   Pulse (!) 103   Temp 97.8 F (36.6 C) (Oral)   Resp (!) 39   Ht 5' (1.524 m)   Wt 255 lb (115.7 kg)   SpO2 92%   BMI 49.80 kg/m  Filed Weights   11/08/2019 1348  Weight: 255 lb (115.7 kg)    Estimated body mass index is 49.8 kg/m as calculated from the following:   Height as of this encounter: 5' (1.524 m).   Weight as of this encounter: 255 lb (115.7 kg).  LABS: CBC:    Component Value Date/Time   WBC 7.7 11/02/2019 1304   HGB 11.3 (L) 11/02/2019 1304   HCT 37.4 11/02/2019 1304   PLT 443 (H) 11/02/2019 1304   MCV  88.4 11/02/2019 1304   NEUTROABS 6.0 11/02/2019 1304   LYMPHSABS 0.5 (L) 11/02/2019 1304   MONOABS 0.8 11/02/2019 1304   EOSABS 0.3 11/02/2019 1304   BASOSABS 0.1 11/02/2019 1304   Comprehensive Metabolic Panel:    Component Value Date/Time   NA 138 11/02/2019 1304   K 3.4 (L) 11/02/2019 1304   CL 98 11/02/2019 1304   CO2 27 11/02/2019 1304   BUN 25 (H) 11/02/2019 1304   CREATININE 0.56 11/02/2019 1304   GLUCOSE 116 (H) 11/02/2019 1304   CALCIUM 9.6 11/02/2019 1304   AST 19 09/10/2019 1004   ALT 14 09/10/2019 1004   ALKPHOS 93 09/10/2019 1004   BILITOT 0.4 09/10/2019 1004   PROT 7.3 09/10/2019 1004   ALBUMIN 3.8 09/10/2019 1004    RADIOGRAPHIC STUDIES: DG Chest 2  View  Result Date: 11/02/2019 CLINICAL DATA:  History of sarcoma EXAM: CHEST - 2 VIEW COMPARISON:  PET-CT from 08/18/2019 FINDINGS: Cardiac shadow is mildly enlarged. Aortic calcifications are seen. There are innumerable nodules identified throughout both lungs significantly increased when compared with the prior exam. The largest of these lies in the lateral aspect of the left lung measuring approximately 4.4 cm. No acute bony abnormality is seen. IMPRESSION: Significant increase in size and number of pulmonary nodules consistent with progressive metastatic disease. Electronically Signed   By: Inez Catalina M.D.   On: 11/02/2019 15:32   DG Chest Port 1 View  Result Date: 10/30/2019 CLINICAL DATA:  Pt arrived via [facility] from home with weakness and SOB after being in the chair for 3 days. Patient reports hx of stage 4 lung cancer. EXAM: PORTABLE CHEST 1 VIEW COMPARISON:  11/02/2019 FINDINGS: There is increased opacity in the lung bases now obscuring hemidiaphragms and much of the heart Thiago Rags[place]. This is consistent with bilateral effusions. Numerous bilateral lung masses and nodules are without significant change from the most recent prior study. No pneumothorax. Skeletal structures are grossly intact. IMPRESSION: 1. Increased  opacity at the lung bases consistent with enlarged pleural effusions when compared to the most recent prior exam, presumably with associated atelectasis. 2. Numerous bilateral pulmonary masses and nodules consistent with widespread metastatic disease, stable from the most recent prior exam. Electronically Signed   By: Lajean Manes M.D.   On: 11/24/2019 14:24    PERFORMANCE STATUS (ECOG) : 3 - Symptomatic, >50% confined to bed  Review of Systems Unless otherwise noted, a complete review of systems is negative.  Physical Exam General: Ill-appearing Cardiovascular: regular rate and rhythm  Pulmonary: Poor air movement Abdomen: soft, nontender, + bowel sounds GU: no suprapubic tenderness Extremities: no edema, no joint deformities Skin: no rashes Neurological: Weakness but otherwise nonfocal  IMPRESSION: Patient seen in the ER.  Patient is currently alert and oriented and was able to please be in a conversation regarding her goals.  Together, we discussed patient's current medical problems in the context of stage IV cancer.  Her symptoms are likely secondary to sequela from the cancer.  Patient says that her primary goal at this point is for symptomatic relief even if it means "knocking me out."  I explained that she was not felt to be a candidate for any future cancer treatment.  Patient verbalized understanding of this.  We had a detailed conversation regarding the recommendation for hospice care and patient says that she was receptive to the idea.  She would be interested in short-term hospitalization to improve with her symptoms and then we can further discuss her disposition either hospice at home or residential hospice facility dependent upon her clinical status at the time.  I discussed CODE STATUS.  Initially, patient thought that she might want an attempt at resuscitation, however, after further discussion she said "when it is my time, it is my time" and "we all have to die."  She stated  clearly and repeatedly that she would not want to be resuscitated or have her life prolonged artificially machines.  She said she was familiar with the concept of a "DNR" and would want to be a DNR.  Patient says that she would want her daughter to be involved in decision-making if necessary.  She was in agreement for me to call her daughter.  PLAN: -Recommend admission for best supportive care -Will probably require hospice involvement at time of discharge -DNR/DNI -Add  morphine/lorazepam for comfort -Recommend therapeutic thoracentesis -We will follow  Case and plan discussed with Dr. Cherylann Banas and Dr. Rogue Bussing   Time Total: 60 minutes  Visit consisted of counseling and education dealing with the complex and emotionally intense issues of symptom management and palliative care in the setting of serious and potentially life-threatening illness.Greater than 50%  of this time was spent counseling and coordinating care related to the above assessment and plan.  Signed by: Altha Harm, PhD, NP-C

## 2019-11-10 NOTE — Telephone Encounter (Signed)
Spoke to Dr.Siedicki-regarding patient's reluctance with chemotherapy/noncompliance with follow-ups.  Given the significant progression-did not think patient is candidate for any further chemotherapy.  I recommend hospice.  Discussed with [facility].

## 2019-11-10 NOTE — Telephone Encounter (Signed)
Per Patient's daughter. Patient refuses to move and get up out the chair. She is incontinent and "sitting in her own urine." patient becomes very combative and agitated with daughter when she "discusses hospice services. I can't force her to come to the hospital. My mom told me that if I call anyone to help her then I'm just get as good as dead to her. She wouldn't even let me call anyone to help her. I am very discouraged. I just don't know what to do." Discussed in length that the patient should go to the ER. However, the patient is still in her right mind to make decisions to refuse care. Daughter states that she would like to call non-emergent ems to at least have the ems team go to the house to evaluate patient's vitals. She realizes that the patient may still refuse emergency care. Daughter stated that she left the patient's house a while ago. When she did, she left the patient's home door unlocked to have access to the home in case of emergency.

## 2019-11-10 NOTE — Progress Notes (Signed)
OR from ED for patient the OR states end of life. Spoke with unit staff about patient condition. Went to patient room which was dark with blinds open, patient was on ventilator and was asleep, I did not want to disturb her so I prayed for her outside the room. Patient will be admitted to hospital I will make note to next shift chaplain to find location and visit if possible.

## 2019-11-10 NOTE — ED Notes (Signed)
Pt cleaned up by this RN and Carney Harder, RN. Severe redness to bottom with no skin breakdown due to having soiled brief on for 3 days. Barrier cream and mediplex barrier placed on bottom. EDP made aware.

## 2019-11-10 NOTE — Progress Notes (Signed)
I spoke with patient's daughter by phone. Updated her on patient's status/workup. She verbalized agreement with DNR and hospice involvement. She says her primary goal is to ensure that patient is comfortable.

## 2019-11-10 NOTE — H&P (Signed)
[facility] History and Physical   Patient: Olivia Rodriguez JQZ:[id number]   PCP: Tracie Harrier, MD DOB: [birth year]   DOA: 11/13/2019   DOS: 11/07/2019   DOS: the patient was seen and examined on 11/20/2019  Patient coming from: The patient is coming from Home  Chief Complaint: Shortness of breath  HPI: Olivia Rodriguez is a 69 y.o. female with Past medical history of as reviewed from EMR, recent diagnosis of metastatic endometrial cancer presents with shortness of breath, gradual onset over the last several days, and severe enough that she was unable to get out of her chair.  The patient states that she has not been able to get up to use the bathroom.  She states that her grown children were trying to get her to come to the hospital, but she was stubborn and did not want to come until EMS brought her in today.  The patient reports feeling generally weak.  She denies any focal pain or any fever or chills.  She has not had vomiting or diarrhea   ED Course: Acute hypoxic respiratory failure on OptiFlow requiring 40 L oxygen FiO2 53% Vitals tachycardic, tachypneic and hypertensive Sodium 134, hyperglycemia glucose 133, BUN 31, alk phos 127, troponin 71 Leukocytosis WBC 17.8 most likely reactive secondary to underlying cancer, thrombocytosis platelet 499 UA positive, blood culture sent from ER CXR 1. Increased opacity at the lung bases consistent with enlarged pleural effusions when compared to the most recent prior exam, presumably with associated atelectasis. 2. Numerous bilateral pulmonary masses and nodules consistent with widespread metastatic disease, stable from the most recent prior exam.   Review of Systems: as mentioned in the history of present illness.  All other systems reviewed and are negative.  Past Medical History:  Diagnosis Date  . Arthritis   . Hypertension    Past Surgical History:  Procedure Laterality Date  . CESAREAN SECTION    . DILATION AND CURETTAGE OF  UTERUS N/A 05/21/2019   Procedure: DILATATION AND CURETTAGE, FRACTIONAL;  Surgeon: Schermerhorn, Gwen Her, MD;  Location: [facility] ORS;  Service: Gynecology;  Laterality: N/A;  . JOINT REPLACEMENT    . REFRACTIVE SURGERY Bilateral 1999  . TONSILLECTOMY    . TOTAL HIP ARTHROPLASTY Left 10/06/2018   Procedure: TOTAL HIP ARTHROPLASTY ANTERIOR APPROACH-LEFT;  Surgeon: Hessie Knows, MD;  Location: [facility] ORS;  Service: Orthopedics;  Laterality: Left;   Social History:  reports that she has never smoked. She has never used smokeless tobacco. She reports previous alcohol use. She reports that she does not use drugs.  Allergies  Allergen Reactions  . Azithromycin     Other reaction(s): Other (See Comments) Patient states Zpak gave her C diff and she does not want this again     Family history reviewed and not pertinent No family history on file.   Prior to Admission medications   Medication Sig Start Date End Date Taking? Authorizing Provider  Cholecalciferol (VITAMIN D3 PO) Take 1 tablet by mouth daily.   Yes [provider]  ferrous sulfate 325 (65 FE) MG EC tablet Take 325 mg by mouth daily with breakfast.   Yes [provider]  hydrochlorothiazide (HYDRODIURIL) 25 MG tablet Take 1 tablet (25 mg total) by mouth daily. 07/28/19  Yes Verlon Au, NP  Potassium (POTASSIMIN PO) Take by mouth.   Yes [provider]  saccharomyces boulardii (FLORASTOR) 250 MG capsule Take 250 mg by mouth daily.    Yes [provider]  acetaminophen (TYLENOL) 325 MG  tablet Take 650 mg by mouth every 6 (six) hours as needed.    [provider]  ibuprofen (ADVIL) 200 MG tablet Take 200 mg by mouth every 6 (six) hours as needed.    [provider]  letrozole (FEMARA) 2.5 MG tablet Take 1 tablet (2.5 mg total) by mouth daily. Once a day. Patient not taking: Reported on 11/17/2019 09/10/19   Cammie Sickle, MD    Physical Exam: Vitals:   11/21/2019 1348  11/15/2019 1432 11/05/2019 1730 11/22/2019 1800  BP: (!) 171/104  (!) 160/63 (!) 142/71  Pulse: (!) 103  95 93  Resp: (!) 39  (!) 29 (!) 29  Temp: 97.8 F (36.6 C)     TempSrc: Oral     SpO2: (!) 89% 92% 90% 90%  Weight: 115.7 kg     Height: 5' (1.524 m)       General: lethargic and oriented to place and person. Appear in marked distress, affect depressed Eyes: PERRLA, Conjunctiva normal ENT: Oral Mucosa Clear, moist  Neck: no JVD, no Abnormal Mass Or lumps Cardiovascular: S1 and S2 Present, no Murmur, peripheral pulses symmetrical Respiratory: decreased respiratory effort, Bilateral Air entry equal and Decreased, signs of accessory muscle use, bilateral crackles, and bilateral mild wheezes Abdomen: Bowel Sound present, Soft and no tenderness, no hernia Skin: no rashes  Extremities: No significant pedal edema,  Neurologic: without any new focal findings Gait not checked due to patient safety concerns  Data Reviewed: I have personally reviewed and interpreted labs, imaging as discussed below.  CBC: Recent Labs  Lab 11/21/2019 1618  WBC 17.8*  NEUTROABS 15.7*  HGB 11.2*  HCT 34.4*  MCV 86.2  PLT 993*   Basic Metabolic Panel: Recent Labs  Lab 11/04/2019 1618  NA 134*  K 3.9  CL 88*  CO2 36*  GLUCOSE 133*  BUN 31*  CREATININE 0.59  CALCIUM 9.9   GFR: Estimated Creatinine Clearance: 77.1 mL/min (by C-G formula based on SCr of 0.59 mg/dL). Liver Function Tests: Recent Labs  Lab 11/19/2019 1618  AST 21  ALT 20  ALKPHOS 127*  BILITOT 0.8  PROT 7.2  ALBUMIN 3.0*   No results for input(s): LIPASE, AMYLASE in the last 168 hours. No results for input(s): AMMONIA in the last 168 hours. Coagulation Profile: No results for input(s): INR, PROTIME in the last 168 hours. Cardiac Enzymes: No results for input(s): CKTOTAL, CKMB, CKMBINDEX, TROPONINI in the last 168 hours. BNP (last 3 results) No results for input(s): PROBNP in the last 8760 hours. HbA1C: No results for  input(s): HGBA1C in the last 72 hours. CBG: No results for input(s): GLUCAP in the last 168 hours. Lipid Profile: No results for input(s): CHOL, HDL, LDLCALC, TRIG, CHOLHDL, LDLDIRECT in the last 72 hours. Thyroid Function Tests: No results for input(s): TSH, T4TOTAL, FREET4, T3FREE, THYROIDAB in the last 72 hours. Anemia Panel: No results for input(s): VITAMINB12, FOLATE, FERRITIN, TIBC, IRON, RETICCTPCT in the last 72 hours. Urine analysis:    Component Value Date/Time   COLORURINE AMBER (A) 11/17/2019 1736   APPEARANCEUR HAZY (A) 11/05/2019 1736   LABSPEC 1.028 11/20/2019 1736   PHURINE 5.0 10/31/2019 1736   GLUCOSEU NEGATIVE 11/05/2019 1736   HGBUR SMALL (A) 11/08/2019 1736   BILIRUBINUR NEGATIVE 11/19/2019 1736   KETONESUR 20 (A) 11/09/2019 1736   PROTEINUR 100 (A) 10/26/2019 1736   NITRITE NEGATIVE 10/26/2019 1736   LEUKOCYTESUR SMALL (A) 11/04/2019 1736    Radiological Exams on Admission: DG Chest Port 1  View  Result Date: 11/24/2019 CLINICAL DATA:  Pt arrived via [facility] from home with weakness and SOB after being in the chair for 3 days. Patient reports hx of stage 4 lung cancer. EXAM: PORTABLE CHEST 1 VIEW COMPARISON:  11/02/2019 FINDINGS: There is increased opacity in the lung bases now obscuring hemidiaphragms and much of the heart borders. This is consistent with bilateral effusions. Numerous bilateral lung masses and nodules are without significant change from the most recent prior study. No pneumothorax. Skeletal structures are grossly intact. IMPRESSION: 1. Increased opacity at the lung bases consistent with enlarged pleural effusions when compared to the most recent prior exam, presumably with associated atelectasis. 2. Numerous bilateral pulmonary masses and nodules consistent with widespread metastatic disease, stable from the most recent prior exam. Electronically Signed   By: Lajean Manes M.D.   On: 11/13/2019 14:24    I reviewed all nursing notes, pharmacy notes,  vitals, pertinent old records.  Assessment/Plan   Principal Problem:   Respiratory failure, acute (HCC) Active Problems:   Endometrial stromal sarcoma (HCC)    Respiratory failure, acute ([place]) due to metastatic cancer and bilateral pleural effusion Continue supplemental oxygen for comfort Continue  DuoNeb as needed  CXR multiple metastatic nodules with bilateral pleural effusion  Metastatic endometrial cancer Patient received chemotherapy recently Per oncologist patient has been reluctant with chemotherapy noncompliance with follow-ups Given the significant progression do not think that patient is a candidate for further chemotherapy, recommended hospice care. Palliative care following and discussed with the family who agreed with the DNR status and hospice care Continue IV morphine as needed for pain control  Nutrition: Regular diet DVT Prophylaxis: Subcutaneous Lovenox  Advance goals of care discussion: DNR   Consults: Palliative care and pastoral care  Family Communication: no family was present at bedside, at the time of interview.  Opportunity was given to ask question and all questions were answered satisfactorily.  Disposition: Admitted as inpatient, step-down unit. Likely to be discharged to hospice facility, in 1-2 days when bed available.  I have discussed plan of care as described above with RN and patient/family.  Severity of Illness: The appropriate patient status for this patient is INPATIENT. Inpatient status is judged to be reasonable and necessary in order to provide the required intensity of service to ensure the patient's safety. The patient's presenting symptoms, physical exam findings, and initial radiographic and laboratory data in the context of their chronic comorbidities is felt to place them at high risk for further clinical deterioration. Furthermore, it is not anticipated that the patient will be medically stable for discharge from the hospital within  2 midnights of admission. The following factors support the patient status of inpatient.   " The patient's presenting symptoms include shortness of breath  " The worrisome physical exam findings include respiratory failure. " The initial radiographic and laboratory data are worrisome because of metastasis and pleural effusion. " The chronic co-morbidities include metastatic endometrial cancer.   * I certify that at the point of admission it is my clinical judgment that the patient will require inpatient hospital care spanning beyond 2 midnights from the point of admission due to high intensity of service, high risk for further deterioration and high frequency of surveillance required.*    Author: Val Riles, MD [facility] 11/20/2019 6:21 PM   To reach On-call, see care teams to locate the attending and reach out to them via [email or web address]. If 7PM-7AM, please contact night-coverage If you still have difficulty reaching the attending  provider, please page the [facility] (Director on Call) for [facility] on amion for assistance.

## 2019-11-10 NOTE — ED Provider Notes (Signed)
[facility] Emergency Department Provider Note ____________________________________________   First MD Initiated Contact with Patient 11/15/2019 1358     (approximate)  I have reviewed the triage vital signs and the nursing notes.   HISTORY  Chief Complaint Shortness of Breath  Level 5 caveat: History present illness limited due to respiratory distress  HPI Olivia Rodriguez is a 69 y.o. female with PMH as noted below as well as recent diagnosis of endometrial cancer presents with shortness of breath, gradual onset over the last several days, and severe enough that she was unable to get out of her chair.  The patient states that she has not been able to get up to use the bathroom.  She states that her grown children were trying to get her to come to the hospital, but she was stubborn and did not want to come until EMS brought her in today.  The patient reports feeling generally weak.  She denies any focal pain or any fever or chills.  She has not had vomiting or diarrhea.   Past Medical History:  Diagnosis Date  . Arthritis   . Hypertension     Patient Active Problem List   Diagnosis Date Noted  . Respiratory failure, acute ([place]) 11/19/2019  . Palliative care encounter   . Overlapping malignant neoplasm of uterine cervix ([place]) 11/04/2019  . Goals of care, counseling/discussion 11/04/2019  . Iron deficiency anemia due to chronic blood loss 09/15/2019  . PMB (postmenopausal bleeding) 09/08/2019  . C. difficile diarrhea 06/18/2019  . QT prolongation 06/14/2019  . Vitamin D deficiency 06/11/2019  . Aortic heart murmur 06/04/2019  . Acute blood loss anemia 06/01/2019  . Hypokalemia 05/23/2019  . Urinary retention with incomplete bladder emptying 05/23/2019  . Endometrial stromal sarcoma ([place]) 05/21/2019  . Postoperative anemia 05/21/2019  . Clostridium difficile infection 11/05/2018  . Colitis 10/27/2018  . Status post total hip replacement, left 10/06/2018    . Essential hypertension 10/05/2018  . BMI 45.0-49.9, adult ([place]) 09/02/2018    Past Surgical History:  Procedure Laterality Date  . CESAREAN SECTION    . DILATION AND CURETTAGE OF UTERUS N/A 05/21/2019   Procedure: DILATATION AND CURETTAGE, FRACTIONAL;  Surgeon: Schermerhorn, Gwen Her, MD;  Location: [facility] ORS;  Service: Gynecology;  Laterality: N/A;  . JOINT REPLACEMENT    . REFRACTIVE SURGERY Bilateral 1999  . TONSILLECTOMY    . TOTAL HIP ARTHROPLASTY Left 10/06/2018   Procedure: TOTAL HIP ARTHROPLASTY ANTERIOR APPROACH-LEFT;  Surgeon: Hessie Knows, MD;  Location: [facility] ORS;  Service: Orthopedics;  Laterality: Left;    Prior to Admission medications   Medication Sig Start Date End Date Taking? Authorizing Provider  Cholecalciferol (VITAMIN D3 PO) Take 1 tablet by mouth daily.   Yes [provider]  ferrous sulfate 325 (65 FE) MG EC tablet Take 325 mg by mouth daily with breakfast.   Yes [provider]  hydrochlorothiazide (HYDRODIURIL) 25 MG tablet Take 1 tablet (25 mg total) by mouth daily. 07/28/19  Yes Verlon Au, NP  Potassium (POTASSIMIN PO) Take by mouth.   Yes [provider]  saccharomyces boulardii (FLORASTOR) 250 MG capsule Take 250 mg by mouth daily.    Yes [provider]  acetaminophen (TYLENOL) 325 MG tablet Take 650 mg by mouth every 6 (six) hours as needed.    [provider]  ibuprofen (ADVIL) 200 MG tablet Take 200 mg by mouth every 6 (six) hours as needed.    [provider]  letrozole (  FEMARA) 2.5 MG tablet Take 1 tablet (2.5 mg total) by mouth daily. Once a day. Patient not taking: Reported on 11/21/2019 09/10/19   Cammie Sickle, MD    Allergies Azithromycin  No family history on file.  Social History Social History   Tobacco Use  . Smoking status: Never Smoker  . Smokeless tobacco: Never Used  Substance Use Topics  . Alcohol use: Not Currently    Comment: Occasional wine or mixed drink   . Drug use: Never    Review of Systems Level 5 caveat: Review of systems limited due to respiratory distress Constitutional: No fever. Cardiovascular: Denies chest pain. Respiratory: Positive for shortness of breath. Gastrointestinal: No vomiting or diarrhea.  Skin: Negative for rash. Neurological: Negative for headache.   ____________________________________________   PHYSICAL EXAM:  VITAL SIGNS: ED Triage Vitals [11/19/2019 1348]  Enc Vitals Group     BP (!) 171/104     Pulse Rate (!) 103     Resp (!) 39     Temp 97.8 F (36.6 C)     Temp Source Oral     SpO2 (!) 89 %     Weight 255 lb (115.7 kg)     Height 5' (1.524 m)     Head Circumference      Peak Flow      Pain Score 9     Pain Loc      Pain Edu?      Excl. in [place]?     Constitutional: Alert and oriented.  Uncomfortable and weak appearing with increased work of breathing. Eyes: Conjunctivae are normal.  EOMI. Head: Atraumatic. Nose: No congestion/rhinnorhea. Mouth/Throat: Mucous membranes are dry.   Neck: Normal range of motion.  Cardiovascular: Tachycardic, regular rhythm. Grossly normal heart sounds.  Good peripheral circulation. Respiratory: Increased respiratory effort with accessory muscle use.  Speaking in short sentences.  Decreased breath sounds with scattered wheezing bilaterally. Gastrointestinal: Soft and nontender. No distention.  Genitourinary: No flank tenderness. Musculoskeletal: No lower extremity edema.  Extremities warm and well perfused.  Neurologic: Motor intact in all extremities. Skin:  Skin is warm and dry. No rash noted. Psychiatric: Slightly anxious appearing.  ____________________________________________   LABS (all labs ordered are listed, but only abnormal results are displayed)  Labs Reviewed  COMPREHENSIVE METABOLIC PANEL - Abnormal; Notable for the following components:      Result Value   Sodium 134 (*)    Chloride 88 (*)    CO2 36 (*)    Glucose, Bld 133 (*)     BUN 31 (*)    Albumin 3.0 (*)    Alkaline Phosphatase 127 (*)    All other components within normal limits  CBC WITH DIFFERENTIAL/PLATELET - Abnormal; Notable for the following components:   WBC 17.8 (*)    Hemoglobin 11.2 (*)    HCT 34.4 (*)    RDW 16.8 (*)    Platelets 499 (*)    Neutro Abs 15.7 (*)    Lymphs Abs 0.4 (*)    Monocytes Absolute 1.5 (*)    Abs Immature Granulocytes 0.10 (*)    All other components within normal limits  URINALYSIS, COMPLETE (UACMP) WITH MICROSCOPIC - Abnormal; Notable for the following components:   Color, Urine AMBER (*)    APPearance HAZY (*)    Hgb urine dipstick SMALL (*)    Ketones, ur 20 (*)    Protein, ur 100 (*)    Leukocytes,Ua SMALL (*)    WBC, UA >50 (*)  Bacteria, UA MANY (*)    All other components within normal limits  TROPONIN I (HIGH SENSITIVITY) - Abnormal; Notable for the following components:   Troponin I (High Sensitivity) 71 (*)    All other components within normal limits  RESPIRATORY PANEL BY RT PCR (FLU A&B, COVID)  CULTURE, BLOOD (ROUTINE X 2)  CULTURE, BLOOD (ROUTINE X 2)  BRAIN NATRIURETIC PEPTIDE  LACTIC ACID, PLASMA  PROCALCITONIN  LACTIC ACID, PLASMA  TROPONIN I (HIGH SENSITIVITY)   ____________________________________________  EKG  ED ECG REPORT I, Arta Silence, the attending physician, personally viewed and interpreted this ECG.  Date: 11/13/2019 EKG Time: 1344 Rate: 96 Rhythm: normal sinus rhythm with PVCs QRS Axis: normal Intervals: normal ST/T Wave abnormalities: Nonspecific T wave abnormalities laterally Narrative Interpretation: Nonspecific abnormalities with no evidence of acute ischemia  ____________________________________________  RADIOLOGY  CXR: Bilateral pleural effusions and extensive opacities consistent with metastatic disease  ____________________________________________   PROCEDURES  Procedure(s) performed: Yes  .1-3 Lead EKG Interpretation Performed by: Arta Silence, MD Authorized by: Arta Silence, MD     Interpretation: non-specific     ECG rate:  105   ECG rate assessment: tachycardic     Rhythm: sinus tachycardia     Ectopy: none     Conduction: normal   Comments:     Cardiac monitoring indicated due to respiratory distress and tachycardia, with concern for possible arrhythmia or cardiac ischemia    Critical Care performed: Yes  CRITICAL CARE Performed by: Arta Silence   Total critical care time: 45 minutes  Critical care time was exclusive of separately billable procedures and treating other patients.  Critical care was necessary to treat or prevent imminent or life-threatening deterioration.  Critical care was time spent personally by me on the following activities: development of treatment plan with patient and/or surrogate as well as nursing, discussions with consultants, evaluation of patient's response to treatment, examination of patient, obtaining history from patient or surrogate, ordering and performing treatments and interventions, ordering and review of laboratory studies, ordering and review of radiographic studies, pulse oximetry and re-evaluation of patient's condition. ____________________________________________   INITIAL IMPRESSION / ASSESSMENT AND PLAN / ED COURSE  Pertinent labs & imaging results that were available during my care of the patient were reviewed by me and considered in my medical decision making (see chart for details).  69 year old female with PMH as noted above as well as a recent diagnosis of lung cancer presents with worsening shortness of breath and generalized weakness over the last several days, causing her to be unable to get out of her chair.  I reviewed the past medical records in epic.  The patient is followed at the cancer center by Dr. Rogue Bussing.  She has a history of stage IV high-grade endometrial carcinoma status post TAH-BSO, with possible progression to lung  metastases.  She was treated with letrozole, and was to be started on carbotaxol but had not yet had her first treatment.  Per the notes, the patient had been in touch with her children but was refusing to come to the hospital until today.  On exam, the patient is uncomfortable appearing, speaking in short sentences.  O2 saturation is in the 80s on room air, and only goes up to around 86-89% on nasal cannula.  She has decreased breath sounds and some wheezing bilaterally.  The mucous membranes are dry.  The patient is tachycardic but with an elevated blood pressure.  Differential includes hypoxia and respiratory distress due to the  cancer itself, full effusion, pulmonary edema, fluid overload due to renal failure, pneumonia, bronchitis, COVID-19.  We we will place the patient on BiPAP due to her hypoxia and increased work of breathing, obtain a chest x-ray, lab work-up, and reassess.  Per the available documentation, the patient does not have advanced directives.  I discussed the plan of care if the patient were to deteriorate.  She stated that she would want CPR and resuscitation if her heart stops.  She was less sure about intubation and mechanical ventilation.  Based on this discussion, patient is full code at this time.  ----------------------------------------- 3:19 PM on 11/09/2019 -----------------------------------------  The patient was unable to tolerate BiPAP, but was switched over to high flow nasal cannula and is now doing well with good oxygenation and improved work of breathing.  She appears much more comfortable.  Chest x-ray shows bilateral effusions and what appears to be extensive metastatic disease.  The patient was seen by a provider from palliative care.  Based on further discussion and explanation of the patient's prognosis, she now no longer wants resuscitation or intubation.  The plan will be to admit her for stabilization and supportive care, with likely transition towards  hospice.  ----------------------------------------- 8:19 PM on 11/15/2019 -----------------------------------------  The patient remained stable on high flow nasal cannula.  Lab work-up revealed findings suggestive of possible infection with an elevated WBC but normal lactate.  I ordered Levaquin for coverage of likely pneumonia.  I discussed the case with Dr. Rogue Bussing from oncology who recommended that the patient not receive chemotherapy, but supportive care and transition to palliative/comfort care.  I then discussed the case with the hospitalist Dr. Dwyane Dee for admission.  __________________________  Olivia Rodriguez was evaluated in Emergency Department on 11/07/2019 for the symptoms described in the history of present illness. She was evaluated in the context of the global COVID-19 pandemic, which necessitated consideration that the patient might be at risk for infection with the SARS-CoV-2 virus that causes COVID-19. Institutional protocols and algorithms that pertain to the evaluation of patients at risk for COVID-19 are in a state of rapid change based on information released by regulatory bodies including the CDC and federal and state organizations. These policies and algorithms were followed during the patient's care in the ED.  ____________________________________________   FINAL CLINICAL IMPRESSION(S) / ED DIAGNOSES  Final diagnoses:  Acute on chronic respiratory failure with hypoxia (HCC)      NEW MEDICATIONS STARTED DURING THIS VISIT:  New Prescriptions   No medications on file     Note:  This document was prepared using Dragon voice recognition software and may include unintentional dictation errors.    Arta Silence, MD 10/25/2019 2021

## 2019-11-10 NOTE — Progress Notes (Signed)
Midline able to be started in right cephalic vein.  Patient has no other viable options for IV start or PICC if midline fails, please consider alternate options.

## 2019-11-10 NOTE — Telephone Encounter (Signed)
Spoke to patient's daughter. By her report, patient hasn't moved in 3 days from chair as she is unable to stand on her own or move with assistance, significantly short of breath, rapidly declining and patient has refused daughter's help. Patient has been saying, 'I just want to sleep, I'm so tired'. Patient no longer able to ambulate or care for herself. Not eating. Sips of fluids only. Incontinent. Patient rapidly declining. Does not have advanced directives. Unclear if patient has capacity for decision making. Patient has not answered or returned my multiple calls over past few days. Discussed options including APS in setting of self-neglect vs EMS to evaluate her. Daughter left back door to house open. Daughter not comfortable calling EMS but feels patient needs evaluation. Billey Chang, NP called EMS and we coordinated a non-emergency safety check by EMS.

## 2019-11-10 NOTE — Progress Notes (Signed)
Pt placed on BiPAP per MD orders, Pt did not tolerate crying out I cant breathe. MD notified and order for HFNC placed. Pt placed on HFNC with quick relief. Will continue to monitor

## 2019-11-10 NOTE — ED Triage Notes (Signed)
Pt arrived via [facility] from home with weakness and SOB after being in the chair for 3 days. Pt A&Ox3 c/o of pain in her legs.

## 2019-11-10 NOTE — Telephone Encounter (Signed)
Daughter called with concerns about her mothers condition. She is refusing to get up, or to go to ER and she states there is now a safety issue with her to the point that she is considering calling APS unless there is another avenue that you know of. Please return her call [phone]

## 2019-11-11 ENCOUNTER — Encounter: Payer: Self-pay | Admitting: Internal Medicine

## 2019-11-11 MED ORDER — GLYCOPYRROLATE 0.2 MG/ML IJ SOLN
0.2000 mg | INTRAMUSCULAR | Status: DC | PRN
  Filled 2019-11-11: qty 1

## 2019-11-11 MED ORDER — POLYVINYL ALCOHOL 1.4 % OP SOLN
1.0000 [drp] | Freq: Four times a day (QID) | OPHTHALMIC | Status: DC | PRN
  Filled 2019-11-11: qty 15

## 2019-11-11 MED ORDER — ONDANSETRON HCL 4 MG PO TABS: 4.0000 mg | ORAL_TABLET | Freq: Four times a day (QID) | ORAL | Status: DC | PRN

## 2019-11-11 MED ORDER — LORAZEPAM 2 MG/ML IJ SOLN
1.0000 mg | INTRAMUSCULAR | Status: DC | PRN
  Administered 2019-11-11: 1 mg via INTRAVENOUS
  Filled 2019-11-11: qty 1

## 2019-11-11 MED ORDER — IPRATROPIUM BROMIDE 0.02 % IN SOLN: 0.5000 mg | Freq: Four times a day (QID) | RESPIRATORY_TRACT | Status: DC | PRN

## 2019-11-11 MED ORDER — LORAZEPAM 1 MG PO TABS: 1.0000 mg | ORAL_TABLET | ORAL | Status: DC | PRN

## 2019-11-11 MED ORDER — KCL IN DEXTROSE-NACL 20-5-0.9 MEQ/L-%-% IV SOLN
INTRAVENOUS | Status: DC
  Filled 2019-11-11: qty 1000

## 2019-11-11 MED ORDER — SODIUM CHLORIDE 0.9% FLUSH
3.0000 mL | Freq: Two times a day (BID) | INTRAVENOUS | Status: DC
  Administered 2019-11-11: 3 mL via INTRAVENOUS

## 2019-11-11 MED ORDER — ENOXAPARIN SODIUM 40 MG/0.4ML ~~LOC~~ SOLN: 40.0000 mg | Freq: Two times a day (BID) | SUBCUTANEOUS | Status: DC

## 2019-11-11 MED ORDER — ONDANSETRON HCL 4 MG/2ML IJ SOLN: 4.0000 mg | Freq: Four times a day (QID) | INTRAMUSCULAR | Status: DC | PRN

## 2019-11-11 MED ORDER — ACETAMINOPHEN 325 MG PO TABS: 650.0000 mg | ORAL_TABLET | Freq: Four times a day (QID) | ORAL | Status: DC | PRN

## 2019-11-11 MED ORDER — BISACODYL 10 MG RE SUPP
10.0000 mg | Freq: Every day | RECTAL | Status: DC | PRN
  Filled 2019-11-11: qty 1

## 2019-11-11 MED ORDER — BIOTENE DRY MOUTH MT LIQD
15.0000 mL | OROMUCOSAL | Status: DC | PRN
  Filled 2019-11-11: qty 15

## 2019-11-11 MED ORDER — ONDANSETRON HCL 4 MG/2ML IJ SOLN
4.0000 mg | Freq: Four times a day (QID) | INTRAMUSCULAR | Status: DC | PRN
  Administered 2019-11-11: 03:00:00 4 mg via INTRAVENOUS
  Filled 2019-11-11: qty 2

## 2019-11-11 MED ORDER — OXYCODONE HCL 5 MG PO TABS: 5.0000 mg | ORAL_TABLET | ORAL | Status: DC | PRN

## 2019-11-11 MED ORDER — GLYCOPYRROLATE 1 MG PO TABS
1.0000 mg | ORAL_TABLET | ORAL | Status: DC | PRN
  Filled 2019-11-11: qty 1

## 2019-11-11 MED ORDER — ONDANSETRON 4 MG PO TBDP: 4.0000 mg | ORAL_TABLET | Freq: Four times a day (QID) | ORAL | Status: DC | PRN

## 2019-11-11 MED ORDER — LORAZEPAM 2 MG/ML PO CONC
1.0000 mg | ORAL | Status: DC | PRN
  Filled 2019-11-11: qty 0.5

## 2019-11-11 MED ORDER — ACETAMINOPHEN 650 MG RE SUPP: 650.0000 mg | Freq: Four times a day (QID) | RECTAL | Status: DC | PRN

## 2019-11-11 MED ORDER — ALBUTEROL SULFATE (2.5 MG/3ML) 0.083% IN NEBU: 2.5000 mg | INHALATION_SOLUTION | RESPIRATORY_TRACT | Status: DC | PRN

## 2019-11-12 ENCOUNTER — Inpatient Hospital Stay: Payer: Medicare Other | Admitting: Internal Medicine

## 2019-11-12 ENCOUNTER — Inpatient Hospital Stay: Payer: Medicare Other

## 2019-11-15 ENCOUNTER — Ambulatory Visit: Payer: Medicare Other

## 2019-11-15 ENCOUNTER — Encounter: Payer: Medicare Other | Admitting: Nurse Practitioner

## 2019-11-15 ENCOUNTER — Other Ambulatory Visit: Payer: Medicare Other

## 2019-11-15 LAB — CULTURE, BLOOD (ROUTINE X 2)
Culture: NO GROWTH
Culture: NO GROWTH
Special Requests: ADEQUATE
Special Requests: ADEQUATE

## 2019-11-17 ENCOUNTER — Encounter: Payer: Self-pay | Admitting: Internal Medicine

## 2019-11-22 ENCOUNTER — Ambulatory Visit: Payer: Medicare Other

## 2019-11-22 ENCOUNTER — Encounter: Payer: Medicare Other | Admitting: Nurse Practitioner

## 2019-11-22 ENCOUNTER — Other Ambulatory Visit: Payer: Medicare Other

## 2019-11-25 NOTE — ED Notes (Signed)
Chaplain paged to come to pt room and provide company and comfort.

## 2019-11-25 NOTE — ED Notes (Signed)
Pt is resting. Respirations are labored and pt is diaphoretic. Pt is not arousable. Pt given 1 mg Ativan for comfort.

## 2019-11-25 NOTE — ED Notes (Signed)
Pt oxygen level is 82% on nonrebreather at 15L, pt respirations are labored and noisy. Pt no longer arousable to voice or touch.

## 2019-11-25 NOTE — ED Notes (Signed)
Pt respirations are slowing and not as labored as before. Pt O2 is 55%.

## 2019-11-25 NOTE — ED Notes (Signed)
Mac, RN, is placing saline gauze over pt eyes.

## 2019-11-25 NOTE — ED Notes (Addendum)
Upon entering room pt is resting in bed, easily arousable, and requested water. This RN brought pt water. Respirations are equal and unlabored, no signs of acute distress.

## 2019-11-25 NOTE — ED Notes (Signed)
Pt daughter at bedside, she denies any needs at this time and states that she wants alone time with her mother. This RN showed pt daughter the call button and reassured her that if she needs anything to hit the button.

## 2019-11-25 NOTE — ED Notes (Addendum)
Upon entering room, pt respirations are labored, and pt is diaphoretic. Pt took HFNC out of nose, and this RN replaced it. This RN called Raquel, charge RN, and Raquel called Rufina Falco, NP. Respiratory therapist is now at bedside.

## 2019-11-25 NOTE — Progress Notes (Addendum)
    BRIEF OVERNIGHT PROGRESS REPORT   SUBJECTIVE: Per nursing staff, patient had persistently declined BiPAP.  She was placed on high flow oxygen but was noted to desat in the upper 60s.  Nursing staff reports that patient is diaphoretic, unresponsive and using accessory muscle for breathing.  OBJECTIVE:On arrival to the bedside, she was afebrile with blood pressure 162/81 mm Hg and pulse rate 103 beats/min, RR 31. There were no focal neurological deficits; she was lethargic with increased work of breathing on a nonrebreather.  ASSESSMENT: 69 year old female with significant history of endometrial stromal sarcoma-high-grade s/p radiation now with mets to the lungs, poorly controlled hypertension, aortic heart murmur, anemia, urinary retention and QT prolongation presenting with respiratory failure with hypoxia.  PLAN: 1.  Acute respiratory failure with hypoxemia  -secondary to metastatic cancer and bilateral pleural effusion - Patient refused BiPAP - Continue with supplemental oxygen for comfort - Comfort care measures initiated  2. Metastatic endometrial cancer - high grade-s/p TAH&BSO treated with Letrozole - Per oncologist patient has been reluctant with chemotherapy noncompliance with follow-ups, patient not candidate for further chemo given poor prognosis.  Hospice care was previously recommended with patient and family in agreement. - Continue IV morphine as needed for pain control   CODE STATUS: DNR   CONSULTS: Palliative care and pastoral care  FAMILY COMMUNICATION:  I had a discussion over the phone with patient's daughter Juliaette, [place].  I explained patient's current condition and possible deterioration given that she had declined BiPAP.  Given her poor prognosis and treatment refusal, decision was made to proceed with comfort care.  Patient's daughter agreed with this plan of care. DISPOSITION: Admitted as inpatient, initially admitted to stepdown however given that she is now  comfort care will admit to [place] floor when bed becomes available.   I have discussed plan of care as described above with RN and patient/family.   Rufina Falco, DNP, CCRN, FNP-C [facility] Nurse Practitioner Between 7pm to 7am - Pager [phone]  After 7am go to [email or web address] - [email or web address]  [facility] [employer]  [phone]

## 2019-11-25 NOTE — ED Provider Notes (Signed)
Patient pronounced at 06:06 AM, passed peacefully in the ED with her daughter at bedside. Patient was DNR due to metastatic cancer and was comfort care only. Patient was being managed by the Hospitalist service however due to lack of bed availability in the hospital, patient was boarding in the ED and I was called to pronounce patient. Hospitalist service Rufina Falco NP was informed of patient's passing. Daughter is at bedside and very appreciative of the care patient received in the hospital.   Alfred Levins, [place], MD 2019/12/08 [phone]

## 2019-11-25 NOTE — ED Notes (Addendum)
Pt info tag placed on rt greater toe and pt placed in body bag to be transported to morgue at this time. No patient belonging in room at this time time.

## 2019-11-25 NOTE — ED Notes (Signed)
Time of death called by Alfred Levins, MD at [phone] on Nov 13, 2019. Daughter at bedside.

## 2019-11-25 NOTE — Death Summary Note (Signed)
Death Summary  [name] [id number] DOB: January 30, 1951 DOA: 11/21/19  PCP: Tracie Harrier, MD   Admit date: 11/21/19 Date of Death: November 22, 2019  Time: Time of death called by Alfred Levins, MD at [phone] on Nov 22, 2019. Daughter at bedside.  Cause of death:  Metastatic endometrial cancer   Final Diagnoses:  Principal Problem:   Respiratory failure, acute ([place]) Active Problems:   Endometrial stromal sarcoma (HCC)   History of present illness:  Olivia Rodriguez is a 69 y.o. female with Past medical history of as reviewed from EMR, recent diagnosis of metastatic endometrial cancer presents with shortness of breath, gradual onset over the last several days, and severe enough that she was unable to get out of her chair. The patient states that she has not been able to get up to use the bathroom. She states that her grown children were trying to get her to come to the hospital, but she was stubborn and did not want to come until EMS brought her in today. The patient reports feeling generally weak. She denies any focal pain or any fever or chills. She has not had vomiting or diarrhea   ED Course: Acute hypoxic respiratory failure on OptiFlow requiring 40 L oxygen FiO2 53% Vitals tachycardic, tachypneic and hypertensive Sodium 134, hyperglycemia glucose 133, BUN 31, alk phos 127, troponin 71 Leukocytosis WBC 17.8 most likely reactive secondary to underlying cancer, thrombocytosis platelet 499 UA positive, blood culture sent from ER CXR 1. Increased opacity at the lung bases consistent with enlarged pleural effusions when compared to the most recent prior exam, presumably with associated atelectasis. 2. Numerous bilateral pulmonary masses and nodules consistent with widespread metastatic disease, stable from the most recent prior exam.   Hospital Course:   Respiratory failure, acute ([place]) due to metastatic cancer and bilateral pleural effusion  supplemental oxygen for comfort and   DuoNeb as needed was given CXR multiple metastatic nodules with bilateral pleural effusion Metastatic endometrial cancer, Patient received chemotherapy recently Per oncologist patient has been reluctant with chemotherapy noncompliance with follow-ups Given the significant progression do not think that patient is a candidate for further chemotherapy, recommended hospice care. Palliative care following and discussed with the family who agreed with the DNR status and hospice care. S/p IV morphine as needed for pain control  I was not available when patient passed away.  I was notified in the morning after 7 AM   Time: Time of death called by Alfred Levins, MD at [phone] on November 22, 2019. Daughter at bedside.  Signed:  Val Riles  [facility] 2019/11/22, 7:12 AM

## 2019-11-25 NOTE — ED Notes (Addendum)
Upon entering room, pt respirations are labored and is diaphoretic. Pt BP is 152/74 and O2 is 91% of HFNC. Raquel, RN made Olivia Rodriguez aware of pt status. Pt refusing bipap at this time, this RN made pt aware of need for bipap and pt still refusing.

## 2019-11-25 NOTE — Progress Notes (Signed)
PHARMACIST - PHYSICIAN COMMUNICATION  CONCERNING:  Enoxaparin (Lovenox) for DVT Prophylaxis    RECOMMENDATION: Patient was prescribed enoxaprin 40mg  q24 hours for VTE prophylaxis.   Filed Weights   10/31/2019 1348  Weight: 255 lb (115.7 kg)    Body mass index is 49.8 kg/m.  Estimated Creatinine Clearance: 77.1 mL/min (by C-G formula based on SCr of 0.59 mg/dL).   Based on [place] patient is candidate for enoxaparin 40mg  every 12 hour dosing due to BMI being >40.  DESCRIPTION: Pharmacy has adjusted enoxaparin dose per [facility] policy.  Patient is now receiving enoxaparin 40mg  every 12 hours.    Ena Dawley, PharmD Clinical Pharmacist  2019-11-12 1:20 AM

## 2019-11-25 NOTE — ED Notes (Addendum)
Rufina Falco, NP, at bedside. Pt condition worsening, pt increasingly diaphoretic and respirations labored, pt now breathing through mouth so HFNC no longer working. [place] states that she will begin comfort care orders and she will attempt to contact pt daughter for update. Pt given morphine for comfort. Nonrebreather placed on pt at 15L.

## 2019-11-25 NOTE — ED Notes (Signed)
Upon entering room, pt HFNC was off of nose and this RN replaced it. Pt O2 increased to 93%.

## 2019-11-25 NOTE — Progress Notes (Addendum)
   PG 5:10 a.m. patient is declining , nurse called daughter she may not be able to make it. Spoke with patients nurse in ED at which time daughter arrived at bedside. She wanted to be alone with her mother I offered prayer, told daughter I was available if needed.  PG @ 5:36 a.m. Charge Nurse requesting Chaplain to continue to check on daughter in room at bedside.

## 2019-11-25 NOTE — ED Notes (Signed)
Pt is resting in bed. Upon entering room pt HFNC was slightly tilted off nose, this RN fixed nasal cannula. Pt states that she is comfortable and denies any further needs at this time.

## 2019-11-25 NOTE — ED Notes (Signed)
BP cuff, pulse ox, and cardiac leads removed from pt. Towel placed under pt neck and guaze placed on pt eyes.

## 2019-11-25 NOTE — ED Notes (Addendum)
Pt resting in bed and appears to be comfortable. Respirations are equal. Pt appears short of breath but when asked how she feels states that she feels fine. This RN asked pt about bipap and pt refused. Pt easily arousable, alert and oriented to self and place, pt unaware of year and why she is at the hospital. Pt able to follow commands.

## 2019-11-25 NOTE — ED Notes (Signed)
Pt respirations are continuously slowing, and HR is decreasing. Pt daughter is at bedside.

## 2019-11-25 NOTE — ED Notes (Signed)
This RN called [facility] at [phone] at [phone] on 12-04-2019 and spoke with Art Buff. Referral number is [id number].

## 2019-11-25 DEATH — deceased

## 2019-11-26 ENCOUNTER — Ambulatory Visit: Payer: Medicare Other

## 2019-11-26 ENCOUNTER — Other Ambulatory Visit: Payer: Medicare Other

## 2019-11-26 ENCOUNTER — Ambulatory Visit: Payer: Medicare Other | Admitting: Internal Medicine

## 2019-11-30 ENCOUNTER — Other Ambulatory Visit: Payer: Medicare Other

## 2019-11-30 ENCOUNTER — Ambulatory Visit: Payer: Medicare Other | Admitting: Internal Medicine

## 2019-12-03 ENCOUNTER — Ambulatory Visit: Payer: Medicare Other | Admitting: Internal Medicine

## 2019-12-03 ENCOUNTER — Other Ambulatory Visit: Payer: Medicare Other

## 2019-12-03 ENCOUNTER — Ambulatory Visit: Payer: Medicare Other

## 2019-12-08 ENCOUNTER — Ambulatory Visit: Payer: Medicare Other

## 2020-03-20 IMAGING — CR DG CHEST 2V
1 series · 2 of 2 positions shown · non-contrast
Comparison: PET-CT from 08/18/2019

CLINICAL DATA: History of sarcoma

EXAM:
CHEST - 2 VIEW

[Series 1: dg chest 2 view · 0.14mm/px · 2 of 2 slices shown]
[im 1/2]
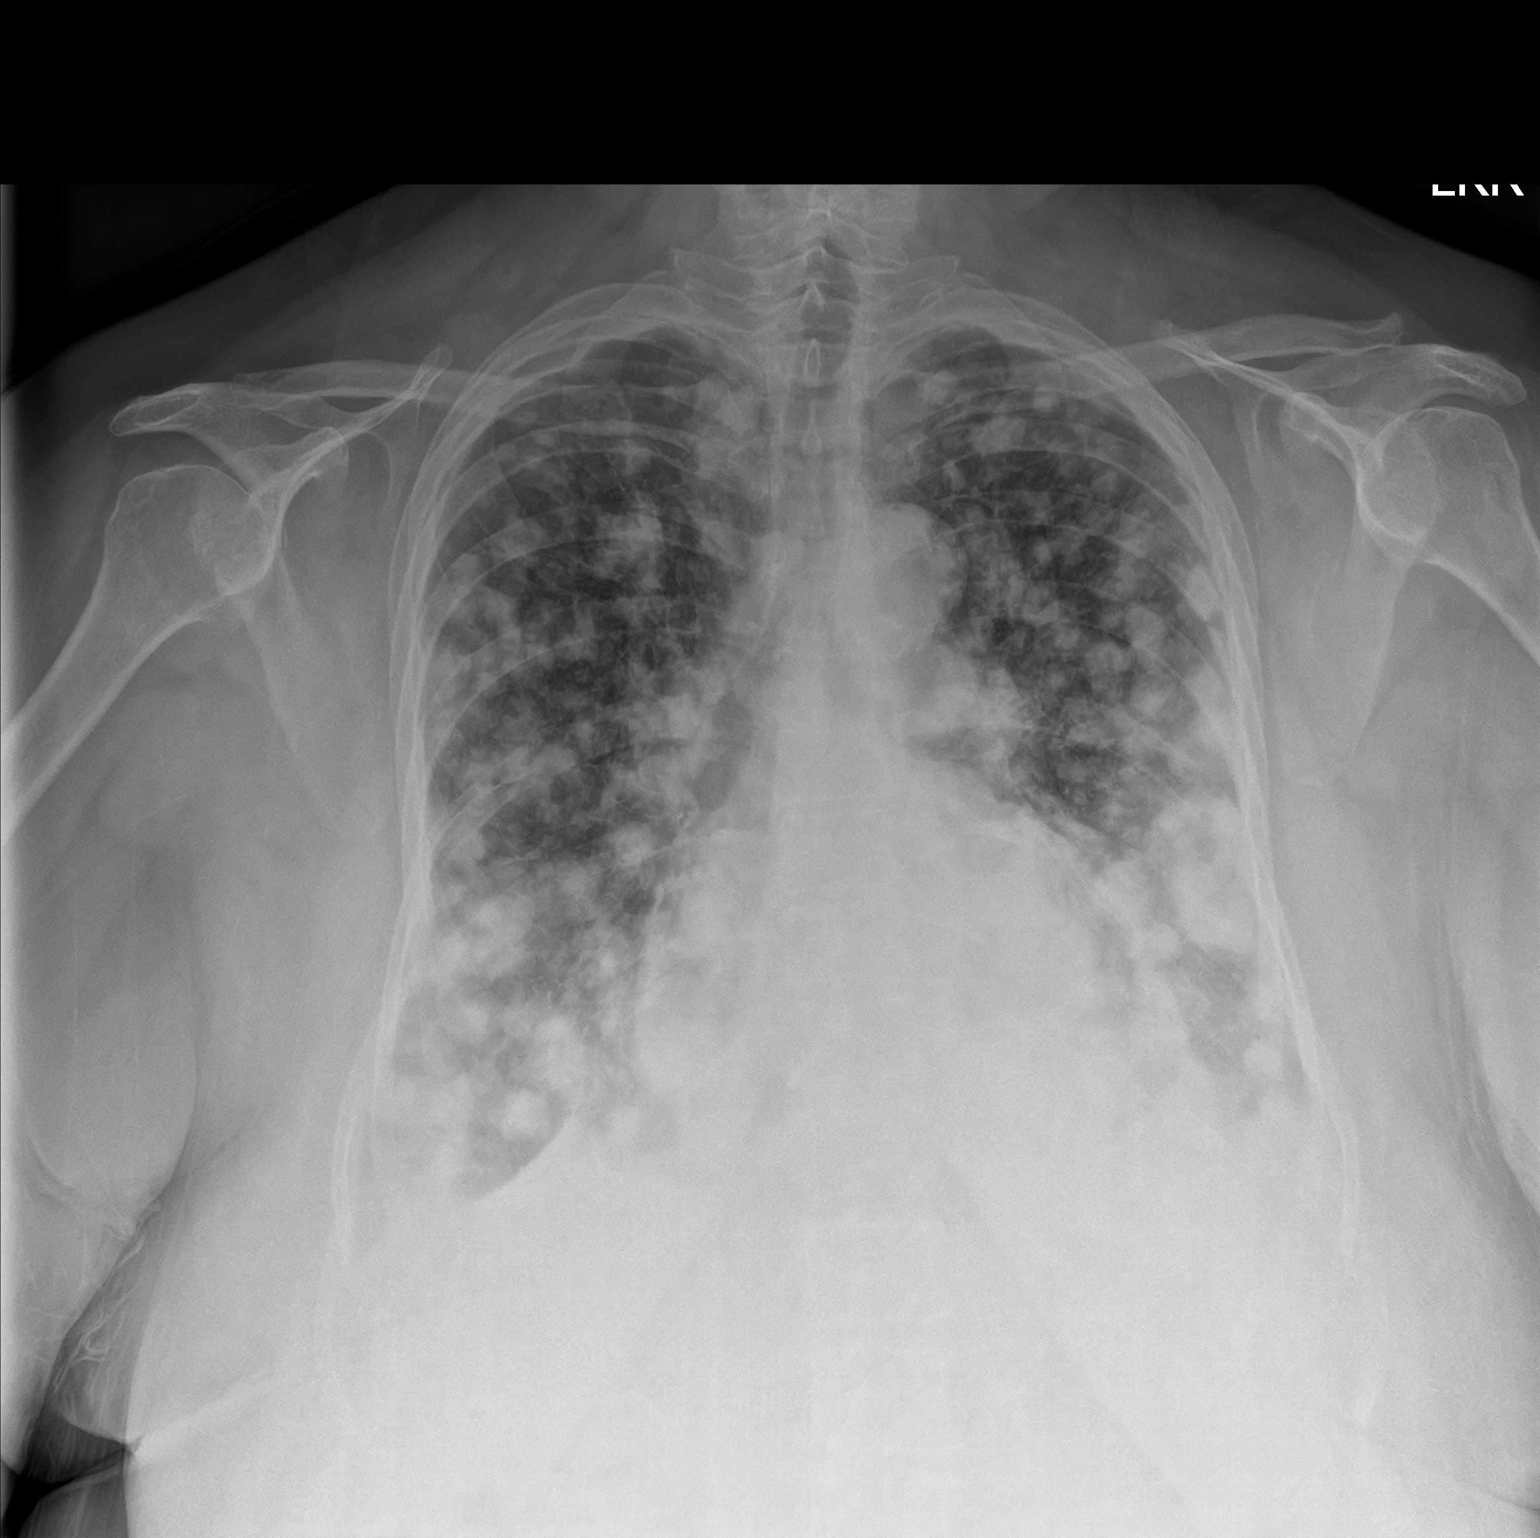
[im 2/2]
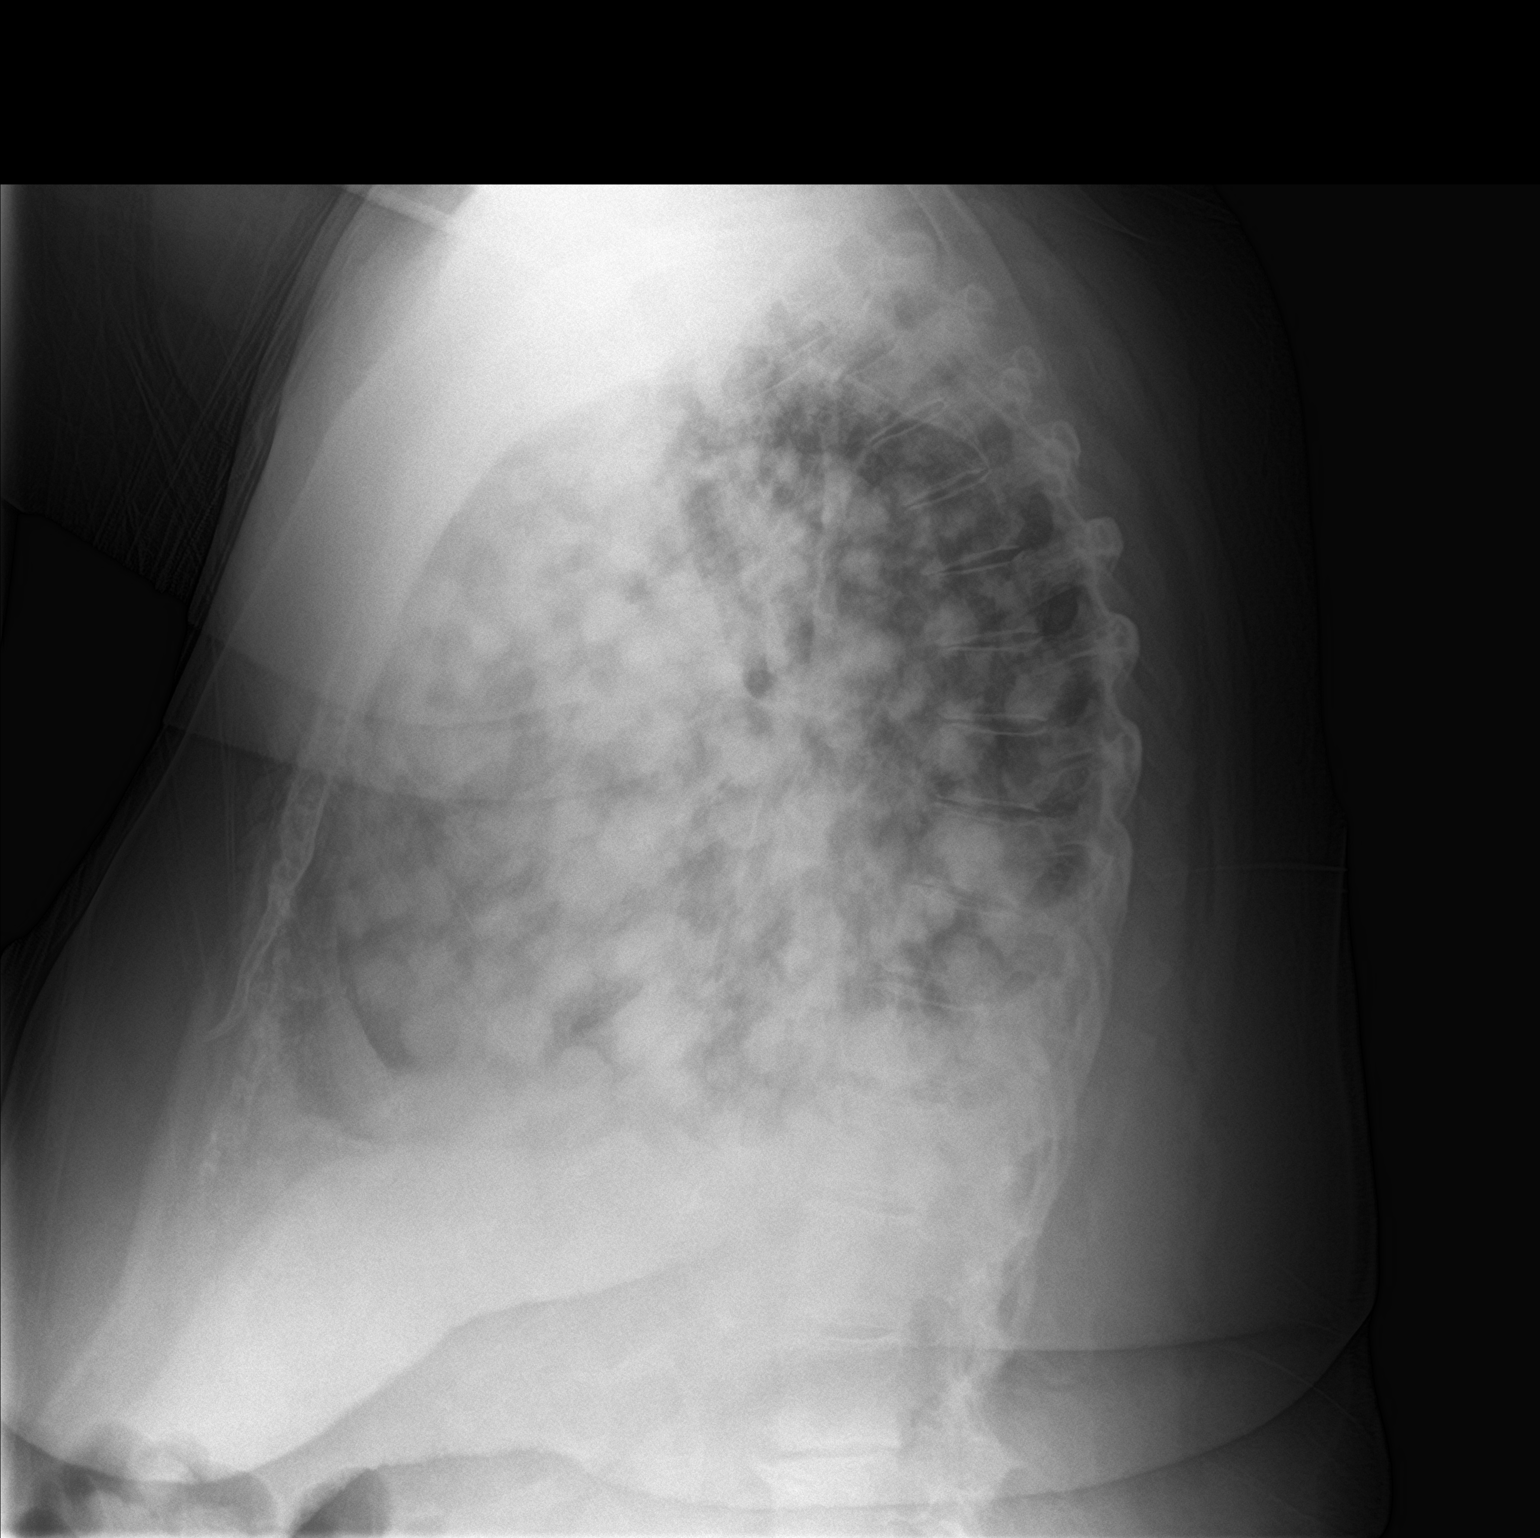

[2 of 2 positions shown; findings below may reference images not displayed]

FINDINGS: Cardiac shadow is mildly enlarged. Aortic calcifications are seen.
There are innumerable nodules identified throughout both lungs
significantly increased when compared with the prior exam. The
largest of these lies in the lateral aspect of the left lung
measuring approximately 4.4 cm. No acute bony abnormality is seen.
IMPRESSION: Significant increase in size and number of pulmonary nodules
consistent with progressive metastatic disease.
